# Patient Record
Sex: Male | Born: 1970 | Race: Black or African American | Hispanic: No | Marital: Single | Smoking: Never smoker
Health system: Southern US, Community
[De-identification: ages and names within clinical notes are randomized; demographics above are authoritative.]

## PROBLEM LIST (undated history)

## (undated) DIAGNOSIS — E119 Type 2 diabetes mellitus without complications: Secondary | ICD-10-CM

## (undated) DIAGNOSIS — E785 Hyperlipidemia, unspecified: Secondary | ICD-10-CM

## (undated) DIAGNOSIS — R319 Hematuria, unspecified: Secondary | ICD-10-CM

## (undated) DIAGNOSIS — J209 Acute bronchitis, unspecified: Secondary | ICD-10-CM

## (undated) DIAGNOSIS — S83281A Other tear of lateral meniscus, current injury, right knee, initial encounter: Secondary | ICD-10-CM

## (undated) DIAGNOSIS — S83241A Other tear of medial meniscus, current injury, right knee, initial encounter: Secondary | ICD-10-CM

## (undated) DIAGNOSIS — J301 Allergic rhinitis due to pollen: Secondary | ICD-10-CM

## (undated) DIAGNOSIS — I10 Essential (primary) hypertension: Secondary | ICD-10-CM

## (undated) DIAGNOSIS — S82131A Displaced fracture of medial condyle of right tibia, initial encounter for closed fracture: Secondary | ICD-10-CM

## (undated) HISTORY — DX: Hematuria, unspecified: R31.9

## (undated) HISTORY — DX: Allergic rhinitis due to pollen: J30.1

## (undated) HISTORY — DX: Hyperlipidemia, unspecified: E78.5

## (undated) HISTORY — DX: Acute bronchitis, unspecified: J20.9

## (undated) HISTORY — DX: Type 2 diabetes mellitus without complications: E11.9

## (undated) HISTORY — DX: Hypercalcemia: E83.52

## (undated) HISTORY — DX: Essential (primary) hypertension: I10

---

## 2004-10-03 ENCOUNTER — Ambulatory Visit: Payer: Self-pay | Admitting: Family Medicine

## 2006-08-05 ENCOUNTER — Emergency Department: Payer: Self-pay | Admitting: Unknown Physician Specialty

## 2006-10-02 ENCOUNTER — Emergency Department: Payer: Self-pay | Admitting: General Practice

## 2006-10-09 ENCOUNTER — Emergency Department: Payer: Self-pay | Admitting: Emergency Medicine

## 2010-12-08 ENCOUNTER — Emergency Department: Payer: Self-pay | Admitting: Emergency Medicine

## 2015-03-02 DIAGNOSIS — E1165 Type 2 diabetes mellitus with hyperglycemia: Secondary | ICD-10-CM | POA: Insufficient documentation

## 2015-03-02 DIAGNOSIS — I129 Hypertensive chronic kidney disease with stage 1 through stage 4 chronic kidney disease, or unspecified chronic kidney disease: Secondary | ICD-10-CM | POA: Insufficient documentation

## 2015-03-02 DIAGNOSIS — R319 Hematuria, unspecified: Secondary | ICD-10-CM | POA: Insufficient documentation

## 2015-03-02 DIAGNOSIS — J301 Allergic rhinitis due to pollen: Secondary | ICD-10-CM | POA: Insufficient documentation

## 2015-03-02 DIAGNOSIS — E785 Hyperlipidemia, unspecified: Secondary | ICD-10-CM | POA: Insufficient documentation

## 2015-05-04 ENCOUNTER — Encounter: Payer: Self-pay | Admitting: Family Medicine

## 2015-05-04 ENCOUNTER — Ambulatory Visit (INDEPENDENT_AMBULATORY_CARE_PROVIDER_SITE_OTHER): Payer: No Typology Code available for payment source | Admitting: Family Medicine

## 2015-05-04 VITALS — BP 156/94 | HR 89 | Temp 98.1°F | Ht 72.1 in | Wt 231.8 lb

## 2015-05-04 DIAGNOSIS — I10 Essential (primary) hypertension: Secondary | ICD-10-CM | POA: Diagnosis not present

## 2015-05-04 DIAGNOSIS — R059 Cough, unspecified: Secondary | ICD-10-CM

## 2015-05-04 DIAGNOSIS — E785 Hyperlipidemia, unspecified: Secondary | ICD-10-CM | POA: Diagnosis not present

## 2015-05-04 DIAGNOSIS — E119 Type 2 diabetes mellitus without complications: Secondary | ICD-10-CM

## 2015-05-04 DIAGNOSIS — R05 Cough: Secondary | ICD-10-CM | POA: Diagnosis not present

## 2015-05-04 LAB — LIPID PANEL PICCOLO, WAIVED
CHOL/HDL RATIO PICCOLO,WAIVE: 2.8 mg/dL
Cholesterol Piccolo, Waived: 125 mg/dL (ref <200)
HDL CHOL PICCOLO, WAIVED: 44 mg/dL — AB (ref >59)
LDL Chol Calc Piccolo Waived: 67 mg/dL (ref <100)
TRIGLYCERIDES PICCOLO,WAIVED: 68 mg/dL (ref <150)
VLDL CHOL CALC PICCOLO,WAIVE: 14 mg/dL (ref <30)

## 2015-05-04 LAB — BAYER DCA HB A1C WAIVED: HB A1C: 8.6 % — AB (ref <7.0)

## 2015-05-04 LAB — MICROALBUMIN, URINE WAIVED
CREATININE, URINE WAIVED: 100 mg/dL (ref 10–300)
MICROALB, UR WAIVED: 80 mg/L — AB (ref 0–19)

## 2015-05-04 MED ORDER — LISINOPRIL 5 MG PO TABS: 5.0000 mg | ORAL_TABLET | Freq: Every day | ORAL | Status: DC

## 2015-05-04 MED ORDER — LISINOPRIL-HYDROCHLOROTHIAZIDE 20-25 MG PO TABS: 1.0000 | ORAL_TABLET | Freq: Every day | ORAL | Status: DC

## 2015-05-04 MED ORDER — METFORMIN HCL ER (MOD) 1000 MG PO TB24: 1000.0000 mg | ORAL_TABLET | Freq: Two times a day (BID) | ORAL | Status: DC

## 2015-05-04 MED ORDER — ROSUVASTATIN CALCIUM 40 MG PO TABS: 40.0000 mg | ORAL_TABLET | Freq: Every day | ORAL | Status: DC

## 2015-05-04 NOTE — Assessment & Plan Note (Addendum)
Not under great control today, but better than expected with an A1c of 8.6. Has been out of his medicine for a month. Will check A1c and CMP today and will restart his medicine. Will recheck in 3 months at follow up DM visit. Encouraged diet and exercise and more frequent checking of his sugars as he is not checking at all.

## 2015-05-04 NOTE — Progress Notes (Signed)
BP 156/94 mmHg  Pulse 89  Temp(Src) 98.1 F (36.7 C)  Ht 6' 0.1" (1.831 m)  Wt 231 lb 12.8 oz (105.144 kg)  BMI 31.36 kg/m2  SpO2 99%   Subjective:    Patient ID: Greg Wood, male    DOB: 10-Mar-1971, 44 y.o.   MRN: [id number]  HPI: Greg Wood is a 44 y.o. male presenting on 05/04/2015 for Hypertension; Diabetes; and Hyperlipidemia Has been out of all his medicine for about a month. Apparently his medicine went to the wrong pharmacy. He states that he has been doing well. He does note that he has had a bit of a cough since he had a sinus infection in March, has had trouble getting rid of it. He is otherwise feeling well with no other concerns or complaints at this time.   HYPERTENSION / HYPERLIPIDEMIA HTN Status: Uncontrolled Satisfied with current treatment? yes Duration of hypertension: chronic BP monitoring frequency: not checking BP medication side effects: no Duration of hyperlipidemia: chronic Cholesterol medication side effects: no Cholesterol supplements: none Past cholesterol medications: none Medication compliance: fair compliance Aspirin: no Recent stressors: no Recurrent headaches: no Visual changes: no Palpitations: no Dyspnea: no Chest pain: no Lower extremity edema: no Dizzy/lightheaded: no  DIABETES Hypoglycemic episodes:no Polydipsia/polyuria: no Visual disturbance: no Chest pain: no Paresthesias: no Glucose Monitoring: no Blood Pressure Monitoring: not checking Retinal Examination: Up to Date Foot Exam: Up to Date Diabetic Education: Completed Pneumovax: Up to Date Influenza: Up to Date Aspirin: no  Relevant past medical, surgical, family and social history reviewed and updated as indicated. Interim medical history since our last visit reviewed. Allergies and medications reviewed and updated.  Review of Systems  Constitutional: Negative.   Respiratory: Negative.   Cardiovascular: Negative.   Gastrointestinal: Negative.   Endocrine:  Negative.   Genitourinary: Negative.   Psychiatric/Behavioral: Negative.     Per HPI unless specifically indicated above     Objective:    BP 156/94 mmHg  Pulse 89  Temp(Src) 98.1 F (36.7 C)  Ht 6' 0.1" (1.831 m)  Wt 231 lb 12.8 oz (105.144 kg)  BMI 31.36 kg/m2  SpO2 99%  Wt Readings from Last 3 Encounters:  05/04/15 231 lb 12.8 oz (105.144 kg)  02/09/15 230 lb (104.327 kg)    Physical Exam  Constitutional: He appears well-developed and well-nourished.  HENT:  Head: Normocephalic and atraumatic.  Eyes: Conjunctivae and EOM are normal. Pupils are equal, round, and reactive to light.  Neck: Normal range of motion. Neck supple.  Cardiovascular: Normal rate, regular rhythm and normal heart sounds.  Exam reveals no gallop and no friction rub.   No murmur heard. Pulmonary/Chest: Effort normal and breath sounds normal. No respiratory distress. He has no wheezes. He has no rales. He exhibits no tenderness.  Abdominal: Soft. Bowel sounds are normal.  Skin: Skin is warm and dry.  Psychiatric: He has a normal mood and affect. His behavior is normal. Thought content normal.  Nursing note and vitals reviewed.  DM Foot Exam Color: normal Sensation Monofilament:normal  Circulation: Pulses normal  Lesions: none      Assessment & Plan:   Problem List Items Addressed This Visit      Cardiovascular and Mediastinum   Hypertension    Not under good control. Has not been on his medicine in over a month. Will check CMP and microalbumin today. Will restart his medication and will follow up in 2-3 weeks for BP check.       Relevant  Medications   rosuvastatin (CRESTOR) 40 MG tablet   lisinopril-hydrochlorothiazide (PRINZIDE,ZESTORETIC) 20-25 MG per tablet   lisinopril (PRINIVIL,ZESTRIL) 5 MG tablet   Other Relevant Orders   Lipid Panel Piccolo, Waived   Bayer DCA Hb A1c Waived   Microalbumin, Urine Waived   Comp Met (CMET)     Endocrine   Diabetes mellitus without complication     Not under great control today, but better than expected with an A1c of 8.6. Has been out of his medicine for a month. Will check A1c and CMP today and will restart his medicine. Will recheck in 3 months at follow up DM visit. Encouraged diet and exercise and more frequent checking of his sugars as he is not checking at all.       Relevant Medications   rosuvastatin (CRESTOR) 40 MG tablet   metFORMIN (GLUMETZA) 1000 MG (MOD) 24 hr tablet   lisinopril-hydrochlorothiazide (PRINZIDE,ZESTORETIC) 20-25 MG per tablet   lisinopril (PRINIVIL,ZESTRIL) 5 MG tablet     Other   Hyperlipidemia    Under good control at this time despite the fact that he has not taken his medication in over a month. Checking lipids and CMP today. Will restart his medication and check levels in 3 months at follow up appointment. Encouraged diet and exercise. Continue to monitor.       Relevant Medications   rosuvastatin (CRESTOR) 40 MG tablet   lisinopril-hydrochlorothiazide (PRINZIDE,ZESTORETIC) 20-25 MG per tablet   lisinopril (PRINIVIL,ZESTRIL) 5 MG tablet    Other Visit Diagnoses    Type 2 diabetes mellitus without complication    -  Primary    Relevant Medications    rosuvastatin (CRESTOR) 40 MG tablet    metFORMIN (GLUMETZA) 1000 MG (MOD) 24 hr tablet    lisinopril-hydrochlorothiazide (PRINZIDE,ZESTORETIC) 20-25 MG per tablet    lisinopril (PRINIVIL,ZESTRIL) 5 MG tablet    Other Relevant Orders    Lipid Panel Piccolo, Waived    Bayer DCA Hb A1c Waived    Microalbumin, Urine Waived    Comp Met (CMET)    Hyperlipemia        Relevant Medications    rosuvastatin (CRESTOR) 40 MG tablet    lisinopril-hydrochlorothiazide (PRINZIDE,ZESTORETIC) 20-25 MG per tablet    lisinopril (PRINIVIL,ZESTRIL) 5 MG tablet    Other Relevant Orders    Lipid Panel Piccolo, Waived    Bayer DCA Hb A1c Waived    Microalbumin, Urine Waived    Comp Met (CMET)    Cough        Lungs clear today. Will check spirometry next visit.  Continue to monitor. Unlikely lisinopril as he has not been on it.         Follow up plan: Return 2-3 weeks, for BP check and Spirometry.

## 2015-05-04 NOTE — Patient Instructions (Signed)
DASH Eating Plan DASH stands for "Dietary Approaches to Stop Hypertension." The DASH eating plan is a healthy eating plan that has been shown to reduce high blood pressure (hypertension). Additional health benefits may include reducing the risk of type 2 diabetes mellitus, heart disease, and stroke. The DASH eating plan may also help with weight loss. WHAT DO I NEED TO KNOW ABOUT THE DASH EATING PLAN? For the DASH eating plan, you will follow these general guidelines:  Choose foods with a percent daily value for sodium of less than 5% (as listed on the food label).  Use salt-free seasonings or herbs instead of table salt or sea salt.  Check with your health care provider or pharmacist before using salt substitutes.  Eat lower-sodium products, often labeled as "lower sodium" or "no salt added."  Eat fresh foods.  Eat more vegetables, fruits, and low-fat dairy products.  Choose whole grains. Look for the word "whole" as the first word in the ingredient list.  Choose fish and skinless chicken or turkey more often than red meat. Limit fish, poultry, and meat to 6 oz (170 g) each day.  Limit sweets, desserts, sugars, and sugary drinks.  Choose heart-healthy fats.  Limit cheese to 1 oz (28 g) per day.  Eat more home-cooked food and less restaurant, buffet, and fast food.  Limit fried foods.  Cook foods using methods other than frying.  Limit canned vegetables. If you do use them, rinse them well to decrease the sodium.  When eating at a restaurant, ask that your food be prepared with less salt, or no salt if possible. WHAT FOODS CAN I EAT? Seek help from a dietitian for individual calorie needs. Grains Whole grain or whole wheat bread. [place]fitt rice. Whole grain or whole wheat pasta. Quinoa, bulgur, and whole grain cereals. Low-sodium cereals. Corn or whole wheat flour tortillas. Whole grain cornbread. Whole grain crackers. Low-sodium crackers. Vegetables Fresh or frozen vegetables  (raw, steamed, roasted, or grilled). Low-sodium or reduced-sodium tomato and vegetable juices. Low-sodium or reduced-sodium tomato sauce and paste. Low-sodium or reduced-sodium canned vegetables.  Fruits All fresh, canned (in natural juice), or frozen fruits. Meat and Other Protein Products Ground beef (85% or leaner), grass-fed beef, or beef trimmed of fat. Skinless chicken or turkey. Ground chicken or turkey. Pork trimmed of fat. All fish and seafood. Eggs. Dried beans, peas, or lentils. Unsalted nuts and seeds. Unsalted canned beans. Dairy Low-fat dairy products, such as skim or 1% milk, 2% or reduced-fat cheeses, low-fat ricotta or cottage cheese, or plain low-fat yogurt. Low-sodium or reduced-sodium cheeses. Fats and Oils Tub margarines without trans fats. Light or reduced-fat mayonnaise and salad dressings (reduced sodium). Avocado. Safflower, olive, or canola oils. Natural peanut or almond butter. Other Unsalted popcorn and pretzels. The items listed above may not be a complete list of recommended foods or beverages. Contact your dietitian for more options. WHAT FOODS ARE NOT RECOMMENDED? Grains White bread. White pasta. White rice. Refined cornbread. Bagels and croissants. Crackers that contain trans fat. Vegetables Creamed or fried vegetables. Vegetables in a cheese sauce. Regular canned vegetables. Regular canned tomato sauce and paste. Regular tomato and vegetable juices. Fruits Dried fruits. Canned fruit in light or heavy syrup. Fruit juice. Meat and Other Protein Products Fatty cuts of meat. Ribs, chicken wings, bacon, sausage, bologna, salami, chitterlings, fatback, hot dogs, bratwurst, and packaged luncheon meats. Salted nuts and seeds. Canned beans with salt. Dairy Whole or 2% milk, cream, half-and-half, and cream cheese. Whole-fat or sweetened yogurt. Full-fat   cheeses or blue cheese. Nondairy creamers and whipped toppings. Processed cheese, cheese spreads, or cheese  curds. Condiments Onion and garlic salt, seasoned salt, table salt, and sea salt. Canned and packaged gravies. Worcestershire sauce. Tartar sauce. Barbecue sauce. Teriyaki sauce. Soy sauce, including reduced sodium. Steak sauce. Fish sauce. Oyster sauce. Cocktail sauce. Horseradish. Ketchup and mustard. Meat flavorings and tenderizers. Bouillon cubes. Hot sauce. Tabasco sauce. Marinades. Taco seasonings. Relishes. Fats and Oils Butter, stick margarine, lard, shortening, ghee, and bacon fat. Coconut, palm kernel, or palm oils. Regular salad dressings. Other Pickles and olives. Salted popcorn and pretzels. The items listed above may not be a complete list of foods and beverages to avoid. Contact your dietitian for more information. WHERE CAN I FIND MORE INFORMATION? National Heart, Lung, and Blood Institute: www.nhlbi.nih.gov/health/health-topics/topics/dash/ Document Released: 10/30/2011 Document Revised: 03/27/2014 Document Reviewed: 09/14/2013 ExitCare Patient Information 2015 [employer]. This information is not intended to replace advice given to you by your health care provider. Make sure you discuss any questions you have with your health care provider. Diabetes and Exercise Exercising regularly is important. It is not just about losing weight. It has many health benefits, such as:  Improving your overall fitness, flexibility, and endurance.  Increasing your bone density.  Helping with weight control.  Decreasing your body fat.  Increasing your muscle strength.  Reducing stress and tension.  Improving your overall health. People with diabetes who exercise gain additional benefits because exercise:  Reduces appetite.  Improves the body's use of blood sugar (glucose).  Helps lower or control blood glucose.  Decreases blood pressure.  Helps control blood lipids (such as cholesterol and triglycerides).  Improves the body's use of the hormone insulin by:  Increasing the  body's insulin sensitivity.  Reducing the body's insulin needs.  Decreases the risk for heart disease because exercising:  Lowers cholesterol and triglycerides levels.  Increases the levels of good cholesterol (such as high-density lipoproteins [HDL]) in the body.  Lowers blood glucose levels. YOUR ACTIVITY PLAN  Choose an activity that you enjoy and set realistic goals. Your health care provider or diabetes educator can help you make an activity plan that works for you. Exercise regularly as directed by your health care provider. This includes:  Performing resistance training twice a week such as push-ups, sit-ups, lifting weights, or using resistance bands.  Performing 150 minutes of cardio exercises each week such as walking, running, or playing sports.  Staying active and spending no more than 90 minutes at one time being inactive. Even short bursts of exercise are good for you. Three 10-minute sessions spread throughout the day are just as beneficial as a single 30-minute session. Some exercise ideas include:  Taking the dog for a walk.  Taking the stairs instead of the elevator.  Dancing to your favorite song.  Doing an exercise video.  Doing your favorite exercise with a friend. RECOMMENDATIONS FOR EXERCISING WITH TYPE 1 OR TYPE 2 DIABETES   Check your blood glucose before exercising. If blood glucose levels are greater than 240 mg/dL, check for urine ketones. Do not exercise if ketones are present.  Avoid injecting insulin into areas of the body that are going to be exercised. For example, avoid injecting insulin into:  The arms when playing tennis.  The legs when jogging.  Keep a record of:  Food intake before and after you exercise.  Expected peak times of insulin action.  Blood glucose levels before and after you exercise.  The type and amount of exercise   you have done.  Review your records with your health care provider. Your health care provider will  help you to develop guidelines for adjusting food intake and insulin amounts before and after exercising.  If you take insulin or oral hypoglycemic agents, watch for signs and symptoms of hypoglycemia. They include:  Dizziness.  Shaking.  Sweating.  Chills.  Confusion.  Drink plenty of water while you exercise to prevent dehydration or heat stroke. Body water is lost during exercise and must be replaced.  Talk to your health care provider before starting an exercise program to make sure it is safe for you. Remember, almost any type of activity is better than none. Document Released: 01/31/2004 Document Revised: 03/27/2014 Document Reviewed: 04/19/2013 ExitCare Patient Information 2015 [employer]. This information is not intended to replace advice given to you by your health care provider. Make sure you discuss any questions you have with your health care provider.  

## 2015-05-04 NOTE — Assessment & Plan Note (Addendum)
Under good control at this time despite the fact that he has not taken his medication in over a month. Checking lipids and CMP today. Will restart his medication and check levels in 3 months at follow up appointment. Encouraged diet and exercise. Continue to monitor.

## 2015-05-04 NOTE — Assessment & Plan Note (Addendum)
Not under good control. Has not been on his medicine in over a month. Will check CMP and microalbumin today. Will restart his medication and will follow up in 2-3 weeks for BP check.

## 2015-05-05 LAB — COMPREHENSIVE METABOLIC PANEL
A/G RATIO: 1.8 (ref 1.1–2.5)
ALBUMIN: 4.5 g/dL (ref 3.5–5.5)
ALK PHOS: 54 IU/L (ref 39–117)
ALT: 18 IU/L (ref 0–44)
AST: 15 IU/L (ref 0–40)
BILIRUBIN TOTAL: 0.3 mg/dL (ref 0.0–1.2)
BUN/Creatinine Ratio: 12 (ref 9–20)
BUN: 10 mg/dL (ref 6–24)
CO2: 24 mmol/L (ref 18–29)
Calcium: 9.6 mg/dL (ref 8.7–10.2)
Chloride: 103 mmol/L (ref 97–108)
Creatinine, Ser: 0.83 mg/dL (ref 0.76–1.27)
GFR calc Af Amer: 122 mL/min/{1.73_m2} (ref >59)
GFR calc non Af Amer: 106 mL/min/{1.73_m2} (ref >59)
Globulin, Total: 2.5 g/dL (ref 1.5–4.5)
Glucose: 182 mg/dL — ABNORMAL HIGH (ref 65–99)
Potassium: 4.5 mmol/L (ref 3.5–5.2)
SODIUM: 142 mmol/L (ref 134–144)
Total Protein: 7 g/dL (ref 6.0–8.5)

## 2015-05-18 ENCOUNTER — Ambulatory Visit: Payer: No Typology Code available for payment source | Admitting: Family Medicine

## 2015-05-31 ENCOUNTER — Other Ambulatory Visit: Payer: Self-pay | Admitting: Family Medicine

## 2015-05-31 NOTE — Telephone Encounter (Signed)
Called and spoke with the pharmacy, even though the prescription said that it was received from us they did not. I called in the one written on 05/04/15.

## 2015-05-31 NOTE — Telephone Encounter (Signed)
Pt's wife called stated pharmacy did not receive refill on Metformin. Pt is almost out has a 2 day supply. Please resend refill for Metformin. [facility] [place]

## 2015-06-02 ENCOUNTER — Other Ambulatory Visit: Payer: Self-pay | Admitting: Family Medicine

## 2015-06-13 ENCOUNTER — Other Ambulatory Visit: Payer: Self-pay | Admitting: Family Medicine

## 2015-07-01 ENCOUNTER — Other Ambulatory Visit: Payer: Self-pay | Admitting: Family Medicine

## 2016-01-23 ENCOUNTER — Other Ambulatory Visit: Payer: Self-pay | Admitting: Family Medicine

## 2016-01-24 NOTE — Telephone Encounter (Signed)
Needs an appointment. Will get him enough medicine to make it to appointment when it's booked.   

## 2016-01-24 NOTE — Telephone Encounter (Signed)
Patient called and schedule f/u appt 01/28/16 in a.m.

## 2016-01-24 NOTE — Telephone Encounter (Signed)
Left message for patient to call and schedule an appointment, then enough medication will be sent to the pharmacy.

## 2016-01-28 ENCOUNTER — Ambulatory Visit (INDEPENDENT_AMBULATORY_CARE_PROVIDER_SITE_OTHER): Payer: No Typology Code available for payment source | Admitting: Family Medicine

## 2016-01-28 ENCOUNTER — Encounter: Payer: Self-pay | Admitting: Family Medicine

## 2016-01-28 ENCOUNTER — Other Ambulatory Visit: Payer: Self-pay | Admitting: Family Medicine

## 2016-01-28 DIAGNOSIS — Z9119 Patient's noncompliance with other medical treatment and regimen: Secondary | ICD-10-CM

## 2016-01-28 DIAGNOSIS — I129 Hypertensive chronic kidney disease with stage 1 through stage 4 chronic kidney disease, or unspecified chronic kidney disease: Secondary | ICD-10-CM | POA: Diagnosis not present

## 2016-01-28 DIAGNOSIS — Z91199 Patient's noncompliance with other medical treatment and regimen due to unspecified reason: Secondary | ICD-10-CM | POA: Insufficient documentation

## 2016-01-28 DIAGNOSIS — Z114 Encounter for screening for human immunodeficiency virus [HIV]: Secondary | ICD-10-CM

## 2016-01-28 DIAGNOSIS — R319 Hematuria, unspecified: Secondary | ICD-10-CM

## 2016-01-28 DIAGNOSIS — E1165 Type 2 diabetes mellitus with hyperglycemia: Secondary | ICD-10-CM

## 2016-01-28 DIAGNOSIS — E785 Hyperlipidemia, unspecified: Secondary | ICD-10-CM

## 2016-01-28 LAB — UA/M W/RFLX CULTURE, ROUTINE
Bilirubin, UA: NEGATIVE
KETONES UA: NEGATIVE
LEUKOCYTES UA: NEGATIVE
Nitrite, UA: NEGATIVE
PROTEIN UA: NEGATIVE
RBC, UA: NEGATIVE
Specific Gravity, UA: 1.02 (ref 1.005–1.030)
Urobilinogen, Ur: 0.2 mg/dL (ref 0.2–1.0)
pH, UA: 5 (ref 5.0–7.5)

## 2016-01-28 LAB — LIPID PANEL PICCOLO, WAIVED
CHOL/HDL RATIO PICCOLO,WAIVE: 4.5 mg/dL
Cholesterol Piccolo, Waived: 227 mg/dL — ABNORMAL HIGH (ref <200)
HDL Chol Piccolo, Waived: 50 mg/dL — ABNORMAL LOW (ref >59)
LDL CHOL CALC PICCOLO WAIVED: 109 mg/dL — AB (ref <100)
Triglycerides Piccolo,Waived: 340 mg/dL — ABNORMAL HIGH (ref <150)
VLDL Chol Calc Piccolo,Waive: 68 mg/dL — ABNORMAL HIGH (ref <30)

## 2016-01-28 LAB — MICROALBUMIN, URINE WAIVED
Creatinine, Urine Waived: 200 mg/dL (ref 10–300)
MICROALB, UR WAIVED: 30 mg/L — AB (ref 0–19)

## 2016-01-28 LAB — BAYER DCA HB A1C WAIVED: HB A1C: 8.6 % — AB (ref <7.0)

## 2016-01-28 MED ORDER — ROSUVASTATIN CALCIUM 40 MG PO TABS: 40.0000 mg | ORAL_TABLET | Freq: Every day | ORAL | Status: DC

## 2016-01-28 MED ORDER — METFORMIN HCL ER (OSM) 1000 MG PO TB24: 1000.0000 mg | ORAL_TABLET | Freq: Two times a day (BID) | ORAL | Status: DC

## 2016-01-28 MED ORDER — LISINOPRIL-HYDROCHLOROTHIAZIDE 20-25 MG PO TABS: 1.0000 | ORAL_TABLET | Freq: Every day | ORAL | Status: DC

## 2016-01-28 MED ORDER — LISINOPRIL 5 MG PO TABS: 5.0000 mg | ORAL_TABLET | Freq: Every day | ORAL | Status: DC

## 2016-01-28 NOTE — Assessment & Plan Note (Signed)
Restart crestor. Take it before he goes to bed. Continue to monitor. Recheck 3 months.

## 2016-01-28 NOTE — Assessment & Plan Note (Signed)
Resolved on urine today.

## 2016-01-28 NOTE — Assessment & Plan Note (Signed)
Strongly encouraged patient to take his medicine every day and to keep his appointments.

## 2016-01-28 NOTE — Progress Notes (Signed)
BP 128/90 mmHg  Pulse 82  Temp(Src) 97.6 F (36.4 C)  Ht 5' 11.1" (1.806 m)  Wt 233 lb (105.688 kg)  BMI 32.40 kg/m2  SpO2 99%   Subjective:    Patient ID: Greg Wood, male    DOB: [birth year], 45 y.o.   MRN: [id number]  HPI: Greg Wood is a 45 y.o. male who returns to the office today after being lost to follow up for 9 months. He has not had any of his medicine in >3 months.   Chief Complaint  Patient presents with  . Hypertension  . Hyperlipidemia  . Diabetes   HYPERTENSION / HYPERLIPIDEMIA Satisfied with current treatment? no Duration of hypertension: chronic BP monitoring frequency: monthly BP medication side effects: no Duration of hyperlipidemia: chronic Cholesterol medication side effects: yes, was making him feel sick Cholesterol supplements: none Medication compliance: poor compliance Aspirin: no Recent stressors: no Recurrent headaches: no Visual changes: no Palpitations: no Dyspnea: no Chest pain: no Lower extremity edema: no Dizzy/lightheaded: no  DIABETES Hypoglycemic episodes:no Polydipsia/polyuria: yes Visual disturbance: no Chest pain: no Paresthesias: no Glucose Monitoring: no  Accucheck frequency: Not Checking Taking Insulin?: no Blood Pressure Monitoring: not checking Retinal Examination: Up to Date Foot Exam: Up to Date Diabetic Education: Completed Pneumovax: Up to Date Influenza: Up to Date Aspirin: yes  Relevant past medical, surgical, family and social history reviewed and updated as indicated. Interim medical history since our last visit reviewed. Allergies and medications reviewed and updated.  Review of Systems  Constitutional: Negative.   Respiratory: Negative.   Cardiovascular: Negative.   Psychiatric/Behavioral: Negative.     Per HPI unless specifically indicated above     Objective:    BP 128/90 mmHg  Pulse 82  Temp(Src) 97.6 F (36.4 C)  Ht 5' 11.1" (1.806 m)  Wt 233 lb (105.688 kg)  BMI 32.40 kg/m2   SpO2 99%  Wt Readings from Last 3 Encounters:  01/28/16 233 lb (105.688 kg)  05/04/15 231 lb 12.8 oz (105.144 kg)  02/09/15 230 lb (104.327 kg)    Physical Exam  Constitutional: He is oriented to person, place, and time. He appears well-developed and well-nourished. No distress.  HENT:  Head: Normocephalic and atraumatic.  Right Ear: Hearing normal.  Left Ear: Hearing normal.  Nose: Nose normal.  Eyes: Conjunctivae and lids are normal. Right eye exhibits no discharge. Left eye exhibits no discharge. No scleral icterus.  Cardiovascular: Normal rate, regular rhythm, normal heart sounds and intact distal pulses.  Exam reveals no gallop and no friction rub.   No murmur heard. Pulmonary/Chest: Effort normal and breath sounds normal. No respiratory distress. He has no wheezes. He has no rales. He exhibits no tenderness.  Musculoskeletal: Normal range of motion.  Neurological: He is alert and oriented to person, place, and time.  Skin: Skin is warm, dry and intact. No rash noted. No erythema. No pallor.  Psychiatric: He has a normal mood and affect. His speech is normal and behavior is normal. Judgment and thought content normal. Cognition and memory are normal.  Nursing note and vitals reviewed.   Results for orders placed or performed in visit on 05/04/15  Lipid Panel Piccolo, [employer]  Result Value Ref Range   Cholesterol Piccolo, Waived 125 <200 mg/dL   HDL Chol Piccolo, Waived 44 (L) >59 mg/dL   Triglycerides Piccolo,Waived 68 <150 mg/dL   Chol/HDL Ratio Piccolo,Waive 2.8 mg/dL   LDL Chol Calc Piccolo Waived 67 <100 mg/dL   VLDL Chol Calc Piccolo,Waive  14 <30 mg/dL  Bayer DCA Hb A1c Waived  Result Value Ref Range   Bayer DCA Hb A1c Waived 8.6 (H) <7.0 %  Microalbumin, Urine Waived  Result Value Ref Range   Microalb, Ur Waived 80 (H) 0 - 19 mg/L   Creatinine, Urine Waived 100 10 - 300 mg/dL   Microalb/Creat Ratio 30-300 (H) <30 mg/g  Comp Met (CMET)  Result Value Ref Range    Glucose 182 (H) 65 - 99 mg/dL   BUN 10 6 - 24 mg/dL   Creatinine, Ser 0.83 0.76 - 1.27 mg/dL   GFR calc non Af Amer 106 >59 mL/min/1.73   GFR calc Af Amer 122 >59 mL/min/1.73   BUN/Creatinine Ratio 12 9 - 20   Sodium 142 134 - 144 mmol/L   Potassium 4.5 3.5 - 5.2 mmol/L   Chloride 103 97 - 108 mmol/L   CO2 24 18 - 29 mmol/L   Calcium 9.6 8.7 - 10.2 mg/dL   Total Protein 7.0 6.0 - 8.5 g/dL   Albumin 4.5 3.5 - 5.5 g/dL   Globulin, Total 2.5 1.5 - 4.5 g/dL   Albumin/Globulin Ratio 1.8 1.1 - 2.5   Bilirubin Total 0.3 0.0 - 1.2 mg/dL   Alkaline Phosphatase 54 39 - 117 IU/L   AST 15 0 - 40 IU/L   ALT 18 0 - 44 IU/L      Assessment & Plan:   Problem List Items Addressed This Visit      Endocrine   Diabetes mellitus with hyperglycemia ([place])    Not under good control. A1c still 8.6, but hasn't been on his medicine in 3 months. Stressed the importance of taking his medication every day. Refills given today. Recheck 3 months.       Relevant Medications   rosuvastatin (CRESTOR) 40 MG tablet   metformin (FORTAMET) 1000 MG (OSM) 24 hr tablet   lisinopril-hydrochlorothiazide (PRINZIDE,ZESTORETIC) 20-25 MG tablet   lisinopril (PRINIVIL,ZESTRIL) 5 MG tablet   Other Relevant Orders   Bayer DCA Hb A1c Waived     Genitourinary   Benign hypertensive renal disease    Not under good control. Stressed the importance of taking his medication every day. Refills given today. Recheck 3 months.         Other   Blood in the urine    Resolved on urine today.      Hyperlipidemia    Restart crestor. Take it before he goes to bed. Continue to monitor. Recheck 3 months.       Relevant Medications   rosuvastatin (CRESTOR) 40 MG tablet   lisinopril-hydrochlorothiazide (PRINZIDE,ZESTORETIC) 20-25 MG tablet   lisinopril (PRINIVIL,ZESTRIL) 5 MG tablet   Other Relevant Orders   LP+ALT+AST Piccolo, Waived   Noncompliance    Strongly encouraged patient to take his medicine every day and to keep his  appointments.        Other Visit Diagnoses    Screening for HIV without presence of risk factors        Labs checked today.        Follow up plan: Return in about 3 months (around 04/29/2016) for DM visit.

## 2016-01-28 NOTE — Assessment & Plan Note (Signed)
Not under good control. Stressed the importance of taking his medication every day. Refills given today. Recheck 3 months.

## 2016-01-28 NOTE — Assessment & Plan Note (Signed)
Not under good control. A1c still 8.6, but hasn't been on his medicine in 3 months. Stressed the importance of taking his medication every day. Refills given today. Recheck 3 months.

## 2016-01-29 ENCOUNTER — Encounter: Payer: Self-pay | Admitting: Family Medicine

## 2016-01-29 LAB — TSH: TSH: 1.06 u[IU]/mL (ref 0.450–4.500)

## 2016-01-29 LAB — COMPREHENSIVE METABOLIC PANEL
ALBUMIN: 4.4 g/dL (ref 3.5–5.5)
ALK PHOS: 49 IU/L (ref 39–117)
ALT: 22 IU/L (ref 0–44)
AST: 17 IU/L (ref 0–40)
Albumin/Globulin Ratio: 1.6 (ref 1.1–2.5)
BUN/Creatinine Ratio: 12 (ref 9–20)
BUN: 11 mg/dL (ref 6–24)
Bilirubin Total: 0.3 mg/dL (ref 0.0–1.2)
CALCIUM: 9.2 mg/dL (ref 8.7–10.2)
CHLORIDE: 98 mmol/L (ref 96–106)
CO2: 23 mmol/L (ref 18–29)
CREATININE: 0.9 mg/dL (ref 0.76–1.27)
GFR calc Af Amer: 117 mL/min/{1.73_m2} (ref >59)
GFR, EST NON AFRICAN AMERICAN: 101 mL/min/{1.73_m2} (ref >59)
GLOBULIN, TOTAL: 2.7 g/dL (ref 1.5–4.5)
GLUCOSE: 175 mg/dL — AB (ref 65–99)
POTASSIUM: 4.5 mmol/L (ref 3.5–5.2)
Sodium: 136 mmol/L (ref 134–144)
Total Protein: 7.1 g/dL (ref 6.0–8.5)

## 2016-01-29 LAB — CBC WITH DIFFERENTIAL/PLATELET
BASOS ABS: 0 10*3/uL (ref 0.0–0.2)
Basos: 1 %
EOS (ABSOLUTE): 0.1 10*3/uL (ref 0.0–0.4)
Eos: 2 %
HEMOGLOBIN: 14.4 g/dL (ref 12.6–17.7)
Hematocrit: 43.2 % (ref 37.5–51.0)
IMMATURE GRANS (ABS): 0 10*3/uL (ref 0.0–0.1)
Immature Granulocytes: 0 %
LYMPHS: 46 %
Lymphocytes Absolute: 2.2 10*3/uL (ref 0.7–3.1)
MCH: 28.3 pg (ref 26.6–33.0)
MCHC: 33.3 g/dL (ref 31.5–35.7)
MCV: 85 fL (ref 79–97)
MONOCYTES: 10 %
Monocytes Absolute: 0.5 10*3/uL (ref 0.1–0.9)
NEUTROS ABS: 1.9 10*3/uL (ref 1.4–7.0)
NEUTROS PCT: 41 %
PLATELETS: 236 10*3/uL (ref 150–379)
RBC: 5.09 x10E6/uL (ref 4.14–5.80)
RDW: 13.4 % (ref 12.3–15.4)
WBC: 4.7 10*3/uL (ref 3.4–10.8)

## 2016-01-29 LAB — HIV ANTIBODY (ROUTINE TESTING W REFLEX): HIV SCREEN 4TH GENERATION: NONREACTIVE

## 2016-02-04 ENCOUNTER — Encounter: Payer: Self-pay | Admitting: Family Medicine

## 2016-02-04 ENCOUNTER — Ambulatory Visit (INDEPENDENT_AMBULATORY_CARE_PROVIDER_SITE_OTHER): Payer: No Typology Code available for payment source | Admitting: Family Medicine

## 2016-02-04 ENCOUNTER — Telehealth: Payer: Self-pay | Admitting: Family Medicine

## 2016-02-04 ENCOUNTER — Ambulatory Visit
Admission: RE | Admit: 2016-02-04 | Discharge: 2016-02-04 | Disposition: A | Discharge status: Home / Self Care | Payer: No Typology Code available for payment source | Source: Ambulatory Visit | Attending: Family Medicine | Admitting: Family Medicine

## 2016-02-04 VITALS — BP 139/89 | HR 71 | Temp 98.6°F | Wt 235.0 lb

## 2016-02-04 DIAGNOSIS — M546 Pain in thoracic spine: Secondary | ICD-10-CM | POA: Diagnosis present

## 2016-02-04 DIAGNOSIS — Z043 Encounter for examination and observation following other accident: Secondary | ICD-10-CM | POA: Diagnosis not present

## 2016-02-04 DIAGNOSIS — M542 Cervicalgia: Secondary | ICD-10-CM | POA: Diagnosis present

## 2016-02-04 DIAGNOSIS — M50322 Other cervical disc degeneration at C5-C6 level: Secondary | ICD-10-CM | POA: Insufficient documentation

## 2016-02-04 DIAGNOSIS — M47814 Spondylosis without myelopathy or radiculopathy, thoracic region: Secondary | ICD-10-CM | POA: Diagnosis not present

## 2016-02-04 DIAGNOSIS — Z041 Encounter for examination and observation following transport accident: Secondary | ICD-10-CM

## 2016-02-04 MED ORDER — CYCLOBENZAPRINE HCL 10 MG PO TABS: 10.0000 mg | ORAL_TABLET | Freq: Every day | ORAL | Status: DC

## 2016-02-04 MED ORDER — IBUPROFEN 600 MG PO TABS: 600.0000 mg | ORAL_TABLET | Freq: Three times a day (TID) | ORAL | Status: DC | PRN
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
## 2016-02-04 NOTE — Patient Instructions (Signed)

## 2016-02-04 NOTE — Telephone Encounter (Signed)
Greg Wood with the results of his x-rays which showed some spasm but no other issues. LMOM for him to call back. OK to give him this message if he calls.

## 2016-02-06 ENCOUNTER — Encounter: Payer: Self-pay | Admitting: Family Medicine

## 2016-02-06 NOTE — Telephone Encounter (Signed)
Unable to get in contact with Debby BudAndre. Letter generated and sent through my chart today.

## 2016-02-23 ENCOUNTER — Other Ambulatory Visit: Payer: Self-pay | Admitting: Family Medicine

## 2016-02-25 NOTE — Telephone Encounter (Signed)
He should have a Rx with 6 refills sent over 3/6

## 2016-02-25 NOTE — Telephone Encounter (Signed)
Patient notified

## 2016-04-30 ENCOUNTER — Ambulatory Visit: Payer: No Typology Code available for payment source | Admitting: Family Medicine

## 2016-08-30 ENCOUNTER — Other Ambulatory Visit: Payer: Self-pay | Admitting: Family Medicine

## 2016-09-29 ENCOUNTER — Other Ambulatory Visit: Payer: Self-pay | Admitting: Family Medicine

## 2016-10-03 ENCOUNTER — Telehealth: Payer: Self-pay | Admitting: Family Medicine

## 2016-10-03 MED ORDER — LISINOPRIL-HYDROCHLOROTHIAZIDE 20-25 MG PO TABS: 1.0000 | ORAL_TABLET | Freq: Every day | ORAL | 0 refills | Status: DC

## 2016-10-03 MED ORDER — METFORMIN HCL ER (OSM) 1000 MG PO TB24: 1000.0000 mg | ORAL_TABLET | Freq: Two times a day (BID) | ORAL | 0 refills | Status: DC

## 2016-10-03 MED ORDER — LISINOPRIL 5 MG PO TABS: 5.0000 mg | ORAL_TABLET | Freq: Every day | ORAL | 0 refills | Status: DC

## 2016-10-03 MED ORDER — ROSUVASTATIN CALCIUM 40 MG PO TABS: 40.0000 mg | ORAL_TABLET | Freq: Every day | ORAL | 0 refills | Status: DC

## 2016-10-03 NOTE — Telephone Encounter (Signed)
Pt scheduled appt 10/09/16. Would like lisinopril-hydrochlorothiazide (PRINZIDE,ZESTORETIC) 20-25 MG tablet and metformin (FORTAMET) 1000 MG (OSM) 24 hr tablet [facility].

## 2016-10-03 NOTE — Telephone Encounter (Signed)
LMOM for pt to call back to schedule an appt with PCP for refills.

## 2016-10-09 ENCOUNTER — Ambulatory Visit: Payer: No Typology Code available for payment source | Admitting: Family Medicine

## 2016-11-13 ENCOUNTER — Encounter: Payer: Self-pay | Admitting: Family Medicine

## 2016-11-13 ENCOUNTER — Ambulatory Visit (INDEPENDENT_AMBULATORY_CARE_PROVIDER_SITE_OTHER): Payer: No Typology Code available for payment source | Admitting: Family Medicine

## 2016-11-13 VITALS — BP 156/100 | HR 83 | Temp 98.4°F | Ht 71.9 in | Wt 234.4 lb

## 2016-11-13 DIAGNOSIS — I129 Hypertensive chronic kidney disease with stage 1 through stage 4 chronic kidney disease, or unspecified chronic kidney disease: Secondary | ICD-10-CM | POA: Diagnosis not present

## 2016-11-13 DIAGNOSIS — E1165 Type 2 diabetes mellitus with hyperglycemia: Secondary | ICD-10-CM | POA: Diagnosis not present

## 2016-11-13 DIAGNOSIS — E782 Mixed hyperlipidemia: Secondary | ICD-10-CM

## 2016-11-13 LAB — MICROSCOPIC EXAMINATION
Bacteria, UA: NONE SEEN
Epithelial Cells (non renal): NONE SEEN /hpf (ref 0–10)
RBC MICROSCOPIC, UA: NONE SEEN /HPF (ref 0–?)
WBC UA: NONE SEEN /HPF (ref 0–?)

## 2016-11-13 LAB — UA/M W/RFLX CULTURE, ROUTINE
BILIRUBIN UA: NEGATIVE
Ketones, UA: NEGATIVE
Leukocytes, UA: NEGATIVE
NITRITE UA: NEGATIVE
PH UA: 5.5 (ref 5.0–7.5)
PROTEIN UA: NEGATIVE
RBC UA: NEGATIVE
Specific Gravity, UA: >1.03 — ABNORMAL HIGH (ref 1.005–1.030)
UUROB: 0.2 mg/dL (ref 0.2–1.0)

## 2016-11-13 MED ORDER — LISINOPRIL 5 MG PO TABS
5.0000 mg | ORAL_TABLET | Freq: Every day | ORAL | 0 refills | Status: DC
Start: 2016-11-13 — End: 2017-03-21

## 2016-11-13 MED ORDER — METFORMIN HCL ER (OSM) 1000 MG PO TB24: 1000.0000 mg | ORAL_TABLET | Freq: Two times a day (BID) | ORAL | 0 refills | Status: DC

## 2016-11-13 MED ORDER — ROSUVASTATIN CALCIUM 40 MG PO TABS
40.0000 mg | ORAL_TABLET | Freq: Every day | ORAL | 0 refills | Status: DC
Start: 2016-11-13 — End: 2017-06-03

## 2016-11-13 MED ORDER — LISINOPRIL-HYDROCHLOROTHIAZIDE 20-25 MG PO TABS: 1.0000 | ORAL_TABLET | Freq: Every day | ORAL | 0 refills | Status: DC

## 2016-11-13 NOTE — Progress Notes (Signed)
BP (!) 156/100 (BP Location: Left Arm, Cuff Size: Large)   Pulse 83   Temp 98.4 F (36.9 C)   Ht 5' 11.9" (1.826 m)   Wt 234 lb 6.4 oz (106.3 kg)   SpO2 98%   BMI 31.88 kg/m    Subjective:    Patient ID: Greg Wood, male    DOB: [birth year], 45 y.o.   MRN: [id number]  HPI: Greg Ruandre E Rossin is a 45 y.o. male  Chief Complaint  Patient presents with  . Diabetes    pt states last eye exam in chart is correct   . Hyperlipidemia  . Hypertension  . Medication Refill    pt states he needs all medications refilled, states he has been out for a while and has not been taking the crestor   Patient presents for management of chronic conditions after being lost to follow up the last 6-9 months. Has been out of all medications for about 2 weeks now. States he stopped his cholesterol medicine on his own about that long ago due to possible stomach upset with it. Has not been watching diet carefully or exercising, but states he does try to eat less sweets and does not hardly ever drink sweet tea. Also cut out most fried Taking medicines pretty regularly when he does have them. Feels very well overall, no complaints at this time. Does well with metformin and BP medications, no side effects noted with them.  Not fasting today for his labs.   Past Medical History:  Diagnosis Date  . Acute bronchitis   . Allergic rhinitis due to pollen   . Blood in the urine   . Diabetes mellitus without complication (HCC)   . Hypercalcemia    Resolved on recheck  . Hyperlipidemia   . Hypertension    Social History   Social History  . Marital status: Single    Spouse name: N/A  . Number of children: N/A  . Years of education: N/A   Occupational History  . Not on file.   Social History Main Topics  . Smoking status: Never Smoker  . Smokeless tobacco: Never Used  . Alcohol use No  . Drug use: No  . Sexual activity: Yes    Birth control/ protection: None   Other Topics Concern  . Not on file    Social History Narrative  . No narrative on file    Relevant past medical, surgical, family and social history reviewed and updated as indicated. Interim medical history since our last visit reviewed. Allergies and medications reviewed and updated.  Review of Systems  Constitutional: Negative.   HENT: Negative.   Eyes: Negative.   Respiratory: Negative.   Cardiovascular: Negative.   Gastrointestinal: Negative.   Genitourinary: Negative.   Musculoskeletal: Negative.   Skin: Negative.   Neurological: Negative.   Psychiatric/Behavioral: Negative.     Per HPI unless specifically indicated above     Objective:    BP (!) 156/100 (BP Location: Left Arm, Cuff Size: Large)   Pulse 83   Temp 98.4 F (36.9 C)   Ht 5' 11.9" (1.826 m)   Wt 234 lb 6.4 oz (106.3 kg)   SpO2 98%   BMI 31.88 kg/m   Wt Readings from Last 3 Encounters:  11/13/16 234 lb 6.4 oz (106.3 kg)  02/04/16 235 lb (106.6 kg)  01/28/16 233 lb (105.7 kg)    Physical Exam  Constitutional: He is oriented to person, place, and time. He appears well-developed and  well-nourished. No distress.  HENT:  Head: Atraumatic.  Eyes: Conjunctivae are normal. Pupils are equal, round, and reactive to light. No scleral icterus.  Neck: Normal range of motion. Neck supple.  Cardiovascular: Normal rate and normal heart sounds.   Pulmonary/Chest: Effort normal and breath sounds normal. No respiratory distress.  Abdominal: Soft. Bowel sounds are normal. There is no tenderness.  Musculoskeletal: Normal range of motion.  Neurological: He is alert and oriented to person, place, and time.  Skin: Skin is warm and dry.  Psychiatric: He has a normal mood and affect. His behavior is normal.  Nursing note and vitals reviewed.   Results for orders placed or performed in visit on 11/13/16  Microscopic Examination  Result Value Ref Range   WBC, UA None seen 0 - 5 /hpf   RBC, UA None seen 0 - 2 /hpf   Epithelial Cells (non renal) None  seen 0 - 10 /hpf   Bacteria, UA None seen None seen/Few  Lipid Panel w/o Chol/HDL Ratio  Result Value Ref Range   Cholesterol, Total 215 (H) 100 - 199 mg/dL   Triglycerides 161255 (H) 0 - 149 mg/dL   HDL 37 (L) >09>39 mg/dL   VLDL Cholesterol Cal 51 (H) 5 - 40 mg/dL   LDL Calculated 604127 (H) 0 - 99 mg/dL  HgB V4UA1c  Result Value Ref Range   Hgb A1c MFr Bld 8.7 (H) 4.8 - 5.6 %   Est. average glucose Bld gHb Est-mCnc 203 mg/dL  UA/M w/rflx Culture, Routine  Result Value Ref Range   Specific Gravity, UA >1.030 (H) 1.005 - 1.030   pH, UA 5.5 5.0 - 7.5   Color, UA Yellow Yellow   Appearance Ur Clear Clear   Leukocytes, UA Negative Negative   Protein, UA Negative Negative/Trace   Glucose, UA Trace (A) Negative   Ketones, UA Negative Negative   RBC, UA Negative Negative   Bilirubin, UA Negative Negative   Urobilinogen, Ur 0.2 0.2 - 1.0 mg/dL   Nitrite, UA Negative Negative   Microscopic Examination See below:   Comprehensive metabolic panel  Result Value Ref Range   Glucose 205 (H) 65 - 99 mg/dL   BUN 15 6 - 24 mg/dL   Creatinine, Ser 9.811.01 0.76 - 1.27 mg/dL   GFR calc non Af Amer 88 >59 mL/min/1.73   GFR calc Af Amer 102 >59 mL/min/1.73   BUN/Creatinine Ratio 15 9 - 20   Sodium 141 134 - 144 mmol/L   Potassium 4.6 3.5 - 5.2 mmol/L   Chloride 102 96 - 106 mmol/L   CO2 25 18 - 29 mmol/L   Calcium 9.4 8.7 - 10.2 mg/dL   Total Protein 7.1 6.0 - 8.5 g/dL   Albumin 4.5 3.5 - 5.5 g/dL   Globulin, Total 2.6 1.5 - 4.5 g/dL   Albumin/Globulin Ratio 1.7 1.2 - 2.2   Bilirubin Total 0.3 0.0 - 1.2 mg/dL   Alkaline Phosphatase 55 39 - 117 IU/L   AST 16 0 - 40 IU/L   ALT 17 0 - 44 IU/L      Assessment & Plan:   Problem List Items Addressed This Visit      Endocrine   Diabetes mellitus with hyperglycemia (HCC)    Await labs. Restart metformin 1000 BID. Long discussion about dietary changes that need to be in place to help reduce his A1C. Patient agreeable to working hard on some changes. [facility]       Relevant  Medications   lisinopril (PRINIVIL,ZESTRIL) 5 MG tablet   lisinopril-hydrochlorothiazide (PRINZIDE,ZESTORETIC) 20-25 MG tablet   metformin (FORTAMET) 1000 MG (OSM) 24 hr tablet   rosuvastatin (CRESTOR) 40 MG tablet   Other Relevant Orders   HgB A1c (Completed)   UA/M w/rflx Culture, Routine (Completed)     Genitourinary   Benign hypertensive renal disease    Restart 5 mg lisinopril and lisinopril HCTZ. DASH diet discussed. Work on increasing exercise. Will see how things are going in 3 months and consider adding amlodipine if poor control      Relevant Orders   UA/M w/rflx Culture, Routine (Completed)   Comprehensive metabolic panel (Completed)     Other   Hyperlipidemia - Primary    Await labs. Not fasting today, will fast for his next follow up appt. Restart cholesterol, continue cutting back on high fat/fried foods. Patient knows to follow up in the next few weeks if the crestor starts upsetting his stomach again. Discussed taking with food.       Relevant Medications   lisinopril (PRINIVIL,ZESTRIL) 5 MG tablet   lisinopril-hydrochlorothiazide (PRINZIDE,ZESTORETIC) 20-25 MG tablet   rosuvastatin (CRESTOR) 40 MG tablet   Other Relevant Orders   Lipid Panel w/o Chol/HDL Ratio (Completed)       Follow up plan: Return in about 3 months (around 02/11/2017) for A1C, fasting lipid.

## 2016-11-14 ENCOUNTER — Telehealth: Payer: Self-pay | Admitting: Family Medicine

## 2016-11-14 LAB — COMPREHENSIVE METABOLIC PANEL
ALBUMIN: 4.5 g/dL (ref 3.5–5.5)
ALT: 17 IU/L (ref 0–44)
AST: 16 IU/L (ref 0–40)
Albumin/Globulin Ratio: 1.7 (ref 1.2–2.2)
Alkaline Phosphatase: 55 IU/L (ref 39–117)
BUN/Creatinine Ratio: 15 (ref 9–20)
BUN: 15 mg/dL (ref 6–24)
Bilirubin Total: 0.3 mg/dL (ref 0.0–1.2)
CALCIUM: 9.4 mg/dL (ref 8.7–10.2)
CO2: 25 mmol/L (ref 18–29)
Chloride: 102 mmol/L (ref 96–106)
Creatinine, Ser: 1.01 mg/dL (ref 0.76–1.27)
GFR, EST AFRICAN AMERICAN: 102 mL/min/{1.73_m2} (ref >59)
GFR, EST NON AFRICAN AMERICAN: 88 mL/min/{1.73_m2} (ref >59)
GLUCOSE: 205 mg/dL — AB (ref 65–99)
Globulin, Total: 2.6 g/dL (ref 1.5–4.5)
Potassium: 4.6 mmol/L (ref 3.5–5.2)
Sodium: 141 mmol/L (ref 134–144)
TOTAL PROTEIN: 7.1 g/dL (ref 6.0–8.5)

## 2016-11-14 LAB — LIPID PANEL W/O CHOL/HDL RATIO
CHOLESTEROL TOTAL: 215 mg/dL — AB (ref 100–199)
HDL: 37 mg/dL — AB (ref >39)
LDL Calculated: 127 mg/dL — ABNORMAL HIGH (ref 0–99)
Triglycerides: 255 mg/dL — ABNORMAL HIGH (ref 0–149)
VLDL Cholesterol Cal: 51 mg/dL — ABNORMAL HIGH (ref 5–40)

## 2016-11-14 LAB — HEMOGLOBIN A1C
ESTIMATED AVERAGE GLUCOSE: 203 mg/dL
Hgb A1c MFr Bld: 8.7 % — ABNORMAL HIGH (ref 4.8–5.6)

## 2016-11-14 NOTE — Telephone Encounter (Signed)
Called and left a message for patient to return my call.  

## 2016-11-14 NOTE — Telephone Encounter (Signed)
Please call pt and let him know that all of his labs looked pretty stable from previous. With good diet and exercise modifications as well as good medication compliance now that he's got all of his meds again, these numbers will likely come down quite a bit by his 3 month follow up. Encourage good lifestyle choices. He should let us know if he continues to have stomach upset with his crestor and we can adjust it

## 2016-11-14 NOTE — Assessment & Plan Note (Addendum)
Await labs. Not fasting today, will fast for his next follow up appt. Restart cholesterol, continue cutting back on high fat/fried foods. Patient knows to follow up in the next few weeks if the crestor starts upsetting his stomach again. Discussed taking with food.

## 2016-11-14 NOTE — Telephone Encounter (Signed)
Patient notified

## 2016-11-14 NOTE — Assessment & Plan Note (Signed)
Restart 5 mg lisinopril and lisinopril HCTZ. DASH diet discussed. Work on increasing exercise. Will see how things are going in 3 months and consider adding amlodipine if poor control

## 2016-11-14 NOTE — Assessment & Plan Note (Addendum)
Await labs. Restart metformin 1000 BID. Long discussion about dietary changes that need to be in place to help reduce his A1C. Patient agreeable to working hard on some changes. [facility]

## 2016-12-25 ENCOUNTER — Telehealth: Payer: Self-pay | Admitting: Family Medicine

## 2016-12-25 MED ORDER — OSELTAMIVIR PHOSPHATE 75 MG PO CAPS: 75.0000 mg | ORAL_CAPSULE | Freq: Every day | ORAL | 0 refills | Status: DC

## 2016-12-25 NOTE — Telephone Encounter (Signed)
Patients grandson has been diagnosed with the flu and the patient needs Tamiflu called in for himself if possible since he has been exposed to the flu.  Please advise.   Thanks  Clydie BraunKaren  [phone]

## 2016-12-25 NOTE — Telephone Encounter (Signed)
Routing to provider  
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
## 2017-02-18 ENCOUNTER — Ambulatory Visit: Payer: No Typology Code available for payment source | Admitting: Family Medicine

## 2017-02-19 ENCOUNTER — Other Ambulatory Visit: Payer: Self-pay | Admitting: Family Medicine

## 2017-02-19 NOTE — Telephone Encounter (Signed)
Routing to provider. Follow up on 02/27/17.

## 2017-02-27 ENCOUNTER — Ambulatory Visit: Payer: No Typology Code available for payment source | Admitting: Family Medicine

## 2017-03-21 ENCOUNTER — Other Ambulatory Visit: Payer: Self-pay | Admitting: Family Medicine

## 2017-03-23 NOTE — Telephone Encounter (Signed)
Routing to provider. No follow up on file. 

## 2017-04-27 ENCOUNTER — Other Ambulatory Visit: Payer: Self-pay | Admitting: Family Medicine

## 2017-04-28 ENCOUNTER — Other Ambulatory Visit: Payer: Self-pay | Admitting: Family Medicine

## 2017-05-20 ENCOUNTER — Other Ambulatory Visit: Payer: Self-pay | Admitting: Family Medicine

## 2017-05-21 NOTE — Telephone Encounter (Signed)
Routing to provider. No follow up scheduled. 

## 2017-05-22 NOTE — Telephone Encounter (Signed)
Routing to provider. No follow up on file. 

## 2017-05-25 ENCOUNTER — Other Ambulatory Visit: Payer: Self-pay | Admitting: Family Medicine

## 2017-05-25 MED ORDER — LISINOPRIL-HYDROCHLOROTHIAZIDE 20-25 MG PO TABS: 1.0000 | ORAL_TABLET | Freq: Every day | ORAL | 0 refills | Status: DC

## 2017-05-25 MED ORDER — METFORMIN HCL ER (OSM) 1000 MG PO TB24: ORAL_TABLET | ORAL | 0 refills | Status: DC

## 2017-05-25 MED ORDER — LISINOPRIL 5 MG PO TABS: ORAL_TABLET | ORAL | 0 refills | Status: DC

## 2017-05-25 NOTE — Telephone Encounter (Signed)
Pt would like to get a refill for lisinopril (PRINIVIL,ZESTRIL) 5 MG tablet , lisinopril-hydrochlorothiazide (PRINZIDE,ZESTORETIC) 20-25 MG tablet and metformin (FORTAMET) 1000 MG (OSM) 24 hr tablet sent to [facility].

## 2017-05-25 NOTE — Telephone Encounter (Signed)
Routing to provider. Has upcoming appointment with Fleet Contrasachel.

## 2017-06-03 ENCOUNTER — Encounter: Payer: Self-pay | Admitting: Family Medicine

## 2017-06-03 ENCOUNTER — Ambulatory Visit (INDEPENDENT_AMBULATORY_CARE_PROVIDER_SITE_OTHER): Payer: BLUE CROSS/BLUE SHIELD | Admitting: Family Medicine

## 2017-06-03 VITALS — BP 143/93 | HR 94 | Temp 99.0°F | Wt 230.0 lb

## 2017-06-03 DIAGNOSIS — E782 Mixed hyperlipidemia: Secondary | ICD-10-CM

## 2017-06-03 DIAGNOSIS — E1165 Type 2 diabetes mellitus with hyperglycemia: Secondary | ICD-10-CM

## 2017-06-03 DIAGNOSIS — I129 Hypertensive chronic kidney disease with stage 1 through stage 4 chronic kidney disease, or unspecified chronic kidney disease: Secondary | ICD-10-CM

## 2017-06-03 DIAGNOSIS — M79671 Pain in right foot: Secondary | ICD-10-CM

## 2017-06-03 MED ORDER — ROSUVASTATIN CALCIUM 20 MG PO TABS: 20.0000 mg | ORAL_TABLET | Freq: Every day | ORAL | 3 refills | Status: DC

## 2017-06-03 NOTE — Progress Notes (Signed)
BP (!) 143/93   Pulse 94   Temp 99 F (37.2 C)   Wt 230 lb (104.3 kg)   SpO2 99%   BMI 31.28 kg/m    Subjective:    Patient ID: Greg Wood, male    DOB: July 03, 1971, 46 y.o.   MRN: [id number]  HPI: Greg Wood is a 46 y.o. male  Chief Complaint  Patient presents with  . Diabetes  . Hypertension  . Hyperlipidemia    He never started back on the Crestor after having side effects   Patient presents today for chronic illness f/u. Ran out of medications for about a month. Has been back on medications for about a week now.   DM - does not check home BS's. Taking metformin faithfully without side effects. States he's been working some on improving diet and exercising more. No low blood sugar spells.   HTN - Does not check home BPs. Doing well with lisinopril HCTZ and 5 mg lisinopril. Denies CP, SOB, palpitations, HAs.   Never restarted his statin, stated at previous appt that he felt it was giving him GI upset so he had d/c'd and decided not to try again with night dosing as discussed previously. Did eat pizza a few hours before coming in today.   Does also c/o intermittent issues with right heel pain, swelling, redness. Having minimal sxs currently, but will sometimes have flares that cause him to barely be able to bear weight. Wears work boots the majority of the time as he is required to for work. Denies hx of gout, alcohol use, seafood. Does not improve as the day goes on when pain is present. Feels more like a deep aching pain than pins and needles. No known injury.   Past Medical History:  Diagnosis Date  . Acute bronchitis   . Allergic rhinitis due to pollen   . Blood in the urine   . Diabetes mellitus without complication (HCC)   . Hypercalcemia    Resolved on recheck  . Hyperlipidemia   . Hypertension    Social History   Social History  . Marital status: Single    Spouse name: N/A  . Number of children: N/A  . Years of education: N/A   Occupational History    . Not on file.   Social History Main Topics  . Smoking status: Never Smoker  . Smokeless tobacco: Never Used  . Alcohol use No  . Drug use: No  . Sexual activity: Yes    Birth control/ protection: None   Other Topics Concern  . Not on file   Social History Narrative  . No narrative on file    Relevant past medical, surgical, family and social history reviewed and updated as indicated. Interim medical history since our last visit reviewed. Allergies and medications reviewed and updated.  Review of Systems  Constitutional: Negative.   HENT: Negative.   Eyes: Negative.   Respiratory: Negative.   Cardiovascular: Negative.   Gastrointestinal: Negative.   Genitourinary: Negative.   Musculoskeletal: Negative.   Neurological: Negative.   Psychiatric/Behavioral: Negative.    Per HPI unless specifically indicated above     Objective:    BP (!) 143/93   Pulse 94   Temp 99 F (37.2 C)   Wt 230 lb (104.3 kg)   SpO2 99%   BMI 31.28 kg/m   Wt Readings from Last 3 Encounters:  06/03/17 230 lb (104.3 kg)  11/13/16 234 lb 6.4 oz (106.3 kg)  02/04/16 235 lb (106.6 kg)    Physical Exam  Constitutional: He is oriented to person, place, and time. He appears well-developed and well-nourished. No distress.  HENT:  Head: Atraumatic.  Eyes: Pupils are equal, round, and reactive to light. Conjunctivae are normal. No scleral icterus.  Neck: Normal range of motion. Neck supple.  Cardiovascular: Normal rate and normal heart sounds.   Pulmonary/Chest: Effort normal and breath sounds normal.  Musculoskeletal: Normal range of motion. He exhibits tenderness (minimal ttp over right heel). He exhibits no edema or deformity.  Neurological: He is alert and oriented to person, place, and time.  Skin: Skin is warm and dry.  Psychiatric: He has a normal mood and affect. His behavior is normal.  Nursing note and vitals reviewed.  Results for orders placed or performed in visit on 06/03/17   Bayer DCA Hb A1c Waived  Result Value Ref Range   Bayer DCA Hb A1c Waived 7.1 (H) <7.0 %  Microalbumin, Urine Waived  Result Value Ref Range   Microalb, Ur Waived 10 0 - 19 mg/L   Creatinine, Urine Waived 200 10 - 300 mg/dL   Microalb/Creat Ratio <30 <30 mg/g  Lipid Panel Piccolo, Waived  Result Value Ref Range   Cholesterol Piccolo, Waived WILL FOLLOW    HDL Chol Piccolo, Waived WILL FOLLOW    Triglycerides Piccolo,Waived WILL FOLLOW    Chol/HDL Ratio Piccolo,Waive WILL FOLLOW    LDL Chol Calc Piccolo Waived WILL FOLLOW    VLDL Chol Calc Piccolo,Waive WILL FOLLOW   Comprehensive metabolic panel  Result Value Ref Range   Glucose 112 (H) 65 - 99 mg/dL   BUN 14 6 - 24 mg/dL   Creatinine, Ser 1.61 0.76 - 1.27 mg/dL   GFR calc non Af Amer 85 >59 mL/min/1.73   GFR calc Af Amer 98 >59 mL/min/1.73   BUN/Creatinine Ratio 13 9 - 20   Sodium 141 134 - 144 mmol/L   Potassium 5.1 3.5 - 5.2 mmol/L   Chloride 100 96 - 106 mmol/L   CO2 22 20 - 29 mmol/L   Calcium 10.5 (H) 8.7 - 10.2 mg/dL   Total Protein 7.6 6.0 - 8.5 g/dL   Albumin 4.7 3.5 - 5.5 g/dL   Globulin, Total 2.9 1.5 - 4.5 g/dL   Albumin/Globulin Ratio 1.6 1.2 - 2.2   Bilirubin Total 0.3 0.0 - 1.2 mg/dL   Alkaline Phosphatase 62 39 - 117 IU/L   AST 18 0 - 40 IU/L   ALT 22 0 - 44 IU/L  Uric acid  Result Value Ref Range   Uric Acid 7.7 3.7 - 8.6 mg/dL  Lipid Panel w/o Chol/HDL Ratio  Result Value Ref Range   Cholesterol, Total 216 (H) 100 - 199 mg/dL   Triglycerides 096 (H) 0 - 149 mg/dL   HDL 38 (L) >04 mg/dL   VLDL Cholesterol Cal 77 (H) 5 - 40 mg/dL   LDL Calculated 540 (H) 0 - 99 mg/dL  Specimen status report  Result Value Ref Range   specimen status report Comment       Assessment & Plan:   Problem List Items Addressed This Visit      Endocrine   Diabetes mellitus with hyperglycemia (HCC)    A1C improved to 7.1. Pt opting to continue metformin alone and will continue improving lifestyle factors in hopes of  getting to goal without more medication. Will re-evaluate in 3 months. Congratulated progress      Relevant Medications   rosuvastatin (CRESTOR)  20 MG tablet   Other Relevant Orders   Bayer DCA Hb A1c Waived (Completed)   Microalbumin, Urine Waived (Completed)     Genitourinary   Benign hypertensive renal disease    Stable and WNL on current regimen. Continue lisinopril with lisinopril HCTZ.       Relevant Orders   Microalbumin, Urine Waived (Completed)     Other   Hyperlipidemia - Primary    Await non-fasting lipid panel, re-iterated recommendation to restart the cholesterol medication to reduce CV risk. Pt agreeable to trying the night dosing schedule as well as continued lifestyle modifications.       Relevant Medications   rosuvastatin (CRESTOR) 20 MG tablet   Other Relevant Orders   Lipid Panel Piccolo, Waived (Completed)   Comprehensive metabolic panel (Completed)    Other Visit Diagnoses    Pain of right heel       Will check uric acid. Discussed potential plantar fasciitis, will try heel inserts into work boots, stretches, soaks, NSAIDs. May do x-ray if no improvement   Relevant Orders   Uric acid       Follow up plan: Return in about 3 months (around 09/03/2017) for CPE.

## 2017-06-04 LAB — COMPREHENSIVE METABOLIC PANEL
ALT: 22 IU/L (ref 0–44)
AST: 18 IU/L (ref 0–40)
Albumin/Globulin Ratio: 1.6 (ref 1.2–2.2)
Albumin: 4.7 g/dL (ref 3.5–5.5)
Alkaline Phosphatase: 62 IU/L (ref 39–117)
BILIRUBIN TOTAL: 0.3 mg/dL (ref 0.0–1.2)
BUN/Creatinine Ratio: 13 (ref 9–20)
BUN: 14 mg/dL (ref 6–24)
CHLORIDE: 100 mmol/L (ref 96–106)
CO2: 22 mmol/L (ref 20–29)
CREATININE: 1.04 mg/dL (ref 0.76–1.27)
Calcium: 10.5 mg/dL — ABNORMAL HIGH (ref 8.7–10.2)
GFR calc Af Amer: 98 mL/min/{1.73_m2} (ref >59)
GFR calc non Af Amer: 85 mL/min/{1.73_m2} (ref >59)
GLOBULIN, TOTAL: 2.9 g/dL (ref 1.5–4.5)
GLUCOSE: 112 mg/dL — AB (ref 65–99)
Potassium: 5.1 mmol/L (ref 3.5–5.2)
SODIUM: 141 mmol/L (ref 134–144)
Total Protein: 7.6 g/dL (ref 6.0–8.5)

## 2017-06-04 LAB — LIPID PANEL PICCOLO, WAIVED

## 2017-06-04 LAB — URIC ACID: URIC ACID: 7.7 mg/dL (ref 3.7–8.6)

## 2017-06-05 ENCOUNTER — Telehealth: Payer: Self-pay | Admitting: Family Medicine

## 2017-06-05 LAB — LIPID PANEL W/O CHOL/HDL RATIO
CHOLESTEROL TOTAL: 216 mg/dL — AB (ref 100–199)
HDL: 38 mg/dL — ABNORMAL LOW (ref >39)
LDL Calculated: 101 mg/dL — ABNORMAL HIGH (ref 0–99)
Triglycerides: 385 mg/dL — ABNORMAL HIGH (ref 0–149)
VLDL CHOLESTEROL CAL: 77 mg/dL — AB (ref 5–40)

## 2017-06-05 LAB — MICROALBUMIN, URINE WAIVED
Creatinine, Urine Waived: 200 mg/dL (ref 10–300)
Microalb, Ur Waived: 10 mg/L (ref 0–19)
Microalb/Creat Ratio: <30 mg/g (ref <30)

## 2017-06-05 LAB — BAYER DCA HB A1C WAIVED: HB A1C: 7.1 % — AB (ref <7.0)

## 2017-06-05 LAB — SPECIMEN STATUS REPORT

## 2017-06-05 NOTE — Assessment & Plan Note (Signed)
Await non-fasting lipid panel, re-iterated recommendation to restart the cholesterol medication to reduce CV risk. Pt agreeable to trying the night dosing schedule as well as continued lifestyle modifications.

## 2017-06-05 NOTE — Assessment & Plan Note (Signed)
Stable and WNL on current regimen. Continue lisinopril with lisinopril HCTZ.

## 2017-06-05 NOTE — Telephone Encounter (Signed)
Left message to call.

## 2017-06-05 NOTE — Telephone Encounter (Signed)
Please call and let him know that his labs came back normal other than the A1C, which we addressed at his appointment, and his cholesterol is still higher than we like. Encourage him to get back on the cholesterol medication and continue to make good lifestyle modifications and we will recheck fasting labs next time  His uric acid level came back normal, so he can continue taking OTC pain relievers for that foot pain and let us know if he wants to have an x-ray done if things aren't improving

## 2017-06-05 NOTE — Assessment & Plan Note (Signed)
A1C improved to 7.1. Pt opting to continue metformin alone and will continue improving lifestyle factors in hopes of getting to goal without more medication. Will re-evaluate in 3 months. Congratulated progress

## 2017-06-08 NOTE — Telephone Encounter (Signed)
Left message to call.

## 2017-06-09 NOTE — Telephone Encounter (Signed)
Patient called to return call to CMA.  Informed patient CMA is out of office this afternoon.Patient would like for CMA to give him a call once she is back in office.   Please Advise.  Thank you

## 2017-06-09 NOTE — Telephone Encounter (Signed)
Left message to call.

## 2017-06-10 NOTE — Telephone Encounter (Signed)
Info relayed to pt via mychart

## 2017-06-10 NOTE — Telephone Encounter (Signed)
Patient's wife called and wanted to inform office patient is unable to answer calls while he is at work due to his job been strict on phones. Patient has been aware of missed calls also. Patient will come in to fill out DPR when possible.   Please Advise.  Thank you

## 2017-06-10 NOTE — Telephone Encounter (Signed)
Called and left patient a VM asking for him to please return our call.

## 2017-06-25 ENCOUNTER — Other Ambulatory Visit: Payer: Self-pay | Admitting: Family Medicine

## 2017-07-27 ENCOUNTER — Other Ambulatory Visit: Payer: Self-pay | Admitting: Family Medicine

## 2017-07-28 NOTE — Telephone Encounter (Signed)
Needs an appointment. Also needs to keep it.

## 2017-07-31 NOTE — Telephone Encounter (Signed)
Patient scheduled for 08/06/2017 at 10:45 pm.

## 2017-07-31 NOTE — Telephone Encounter (Signed)
Patient needing refill on each of his medications except for his cholesterol medication. Patient unaware of the name of his cholesterol medication. Patient states he is completely out of medication.  Please Advise.  [employer]

## 2017-07-31 NOTE — Telephone Encounter (Signed)
Please get patient scheduled.  °

## 2017-08-06 ENCOUNTER — Ambulatory Visit (INDEPENDENT_AMBULATORY_CARE_PROVIDER_SITE_OTHER): Payer: BLUE CROSS/BLUE SHIELD | Admitting: Family Medicine

## 2017-08-06 ENCOUNTER — Encounter: Payer: Self-pay | Admitting: Family Medicine

## 2017-08-06 VITALS — BP 169/112 | HR 89 | Temp 99.3°F | Wt 237.6 lb

## 2017-08-06 DIAGNOSIS — I129 Hypertensive chronic kidney disease with stage 1 through stage 4 chronic kidney disease, or unspecified chronic kidney disease: Secondary | ICD-10-CM

## 2017-08-06 DIAGNOSIS — E782 Mixed hyperlipidemia: Secondary | ICD-10-CM | POA: Diagnosis not present

## 2017-08-06 DIAGNOSIS — R319 Hematuria, unspecified: Secondary | ICD-10-CM | POA: Diagnosis not present

## 2017-08-06 DIAGNOSIS — Z125 Encounter for screening for malignant neoplasm of prostate: Secondary | ICD-10-CM | POA: Diagnosis not present

## 2017-08-06 DIAGNOSIS — E1165 Type 2 diabetes mellitus with hyperglycemia: Secondary | ICD-10-CM | POA: Diagnosis not present

## 2017-08-06 LAB — UA/M W/RFLX CULTURE, ROUTINE
BILIRUBIN UA: NEGATIVE
GLUCOSE, UA: NEGATIVE
KETONES UA: NEGATIVE
Leukocytes, UA: NEGATIVE
NITRITE UA: NEGATIVE
Protein, UA: NEGATIVE
RBC UA: NEGATIVE
SPEC GRAV UA: 1.01 (ref 1.005–1.030)
UUROB: 0.2 mg/dL (ref 0.2–1.0)
pH, UA: 7 (ref 5.0–7.5)

## 2017-08-06 LAB — MICROSCOPIC EXAMINATION: Bacteria, UA: NONE SEEN

## 2017-08-06 LAB — BAYER DCA HB A1C WAIVED: HB A1C (BAYER DCA - WAIVED): 8.1 % — ABNORMAL HIGH (ref <7.0)

## 2017-08-06 LAB — MICROALBUMIN, URINE WAIVED
Creatinine, Urine Waived: 50 mg/dL (ref 10–300)
MICROALB, UR WAIVED: 10 mg/L (ref 0–19)

## 2017-08-06 MED ORDER — NAPROXEN 500 MG PO TABS: 500.0000 mg | ORAL_TABLET | Freq: Two times a day (BID) | ORAL | 0 refills | Status: DC

## 2017-08-06 MED ORDER — LISINOPRIL 5 MG PO TABS: 5.0000 mg | ORAL_TABLET | Freq: Every day | ORAL | 0 refills | Status: DC

## 2017-08-06 MED ORDER — METFORMIN HCL ER (OSM) 1000 MG PO TB24: ORAL_TABLET | ORAL | 0 refills | Status: DC

## 2017-08-06 MED ORDER — ROSUVASTATIN CALCIUM 20 MG PO TABS: 20.0000 mg | ORAL_TABLET | Freq: Every day | ORAL | 0 refills | Status: DC

## 2017-08-06 MED ORDER — LISINOPRIL-HYDROCHLOROTHIAZIDE 20-25 MG PO TABS: 1.0000 | ORAL_TABLET | Freq: Every day | ORAL | 0 refills | Status: DC

## 2017-08-06 NOTE — Assessment & Plan Note (Signed)
Has been off his medicine for 3 weeks. Restart medicine. Recheck 1 month. Call with any concerns.

## 2017-08-06 NOTE — Patient Instructions (Addendum)

## 2017-08-06 NOTE — Assessment & Plan Note (Signed)
Continue crestor. Rechecking levels today. Continue current regimen. Continue to monitor. Call with any concerns.

## 2017-08-06 NOTE — Assessment & Plan Note (Signed)
UA clear today. Resolving off list.

## 2017-08-06 NOTE — Assessment & Plan Note (Signed)
Not under good control A1c 8.1. Restart metformin. Follow up 1 month.

## 2017-08-06 NOTE — Progress Notes (Signed)
BP (!) 169/112 (BP Location: Left Arm, Patient Position: Sitting, Cuff Size: Large)   Pulse 89   Temp 99.3 F (37.4 C)   Wt 237 lb 9 oz (107.8 kg)   SpO2 99%   BMI 32.31 kg/m    Subjective:    Patient ID: Greg Wood, male    DOB: 11-14-1971, 46 y.o.   MRN: [id number]  HPI: Wood Greg is a 46 y.o. male  Chief Complaint  Patient presents with  . Hypertension  . Hyperlipidemia  . Diabetes   HYPERTENSION / HYPERLIPIDEMIA- has been out of his medicine for about 3 weeks Satisfied with current treatment? no Duration of hypertension: chronic BP monitoring frequency: not checking BP range:  BP medication side effects: no Past BP meds: lisinopril-HCTZ Duration of hyperlipidemia: chronic Cholesterol medication side effects: no Cholesterol supplements: none Past cholesterol medications: rosuvastatin (crestor) Medication compliance: poor compliance Aspirin: no Recent stressors: no Recurrent headaches: no Visual changes: no Palpitations: no Dyspnea: no Chest pain: no Lower extremity edema: no Dizzy/lightheaded: no  DIABETES Hypoglycemic episodes:no Polydipsia/polyuria: no Visual disturbance: no Chest pain: no Paresthesias: no Glucose Monitoring: no  Accucheck frequency: Not Checking Taking Insulin?: no Blood Pressure Monitoring: not checking Retinal Examination: Not up to Date Foot Exam: Up to Date Diabetic Education: Not Completed Pneumovax: Up to Date Influenza: Not up to Date Aspirin: no   KNEE PAIN Duration: 3 weeks Involved knee: right Mechanism of injury: unknown Location:anterior Onset: sudden Severity: mild  Quality:  Sore and tight Frequency: intermittent Radiation: no Aggravating factors: bending and walking   Alleviating factors: heat   Status: stable Treatments attempted: rest, heat, APAP and ibuprofen  Relief with NSAIDs?:  unknown Weakness with weight bearing or walking: no Sensation of giving way: no Locking: no Popping:  yes Bruising: no Swelling: yes Redness: no Paresthesias/decreased sensation: no Fevers: no   Relevant past medical, surgical, family and social history reviewed and updated as indicated. Interim medical history since our last visit reviewed. Allergies and medications reviewed and updated.  Review of Systems  Constitutional: Negative.   Respiratory: Negative.   Cardiovascular: Negative.   Musculoskeletal: Positive for arthralgias. Negative for back pain, gait problem, joint swelling, myalgias, neck pain and neck stiffness.  Psychiatric/Behavioral: Negative.     Per HPI unless specifically indicated above     Objective:    BP (!) 169/112 (BP Location: Left Arm, Patient Position: Sitting, Cuff Size: Large)   Pulse 89   Temp 99.3 F (37.4 C)   Wt 237 lb 9 oz (107.8 kg)   SpO2 99%   BMI 32.31 kg/m   Wt Readings from Last 3 Encounters:  08/06/17 237 lb 9 oz (107.8 kg)  06/03/17 230 lb (104.3 kg)  11/13/16 234 lb 6.4 oz (106.3 kg)    Physical Exam  Constitutional: He is oriented to person, place, and time. He appears well-developed and well-nourished. No distress.  HENT:  Head: Normocephalic and atraumatic.  Right Ear: Hearing normal.  Left Ear: Hearing normal.  Nose: Nose normal.  Eyes: Conjunctivae and lids are normal. Right eye exhibits no discharge. Left eye exhibits no discharge. No scleral icterus.  Cardiovascular: Normal rate, regular rhythm, normal heart sounds and intact distal pulses.  Exam reveals no gallop and no friction rub.   No murmur heard. Pulmonary/Chest: Effort normal and breath sounds normal. No respiratory distress. He has no wheezes. He has no rales. He exhibits no tenderness.  Musculoskeletal: He exhibits tenderness (medial joint line). He exhibits no edema  or deformity.  Negative mcmurrays, negative lachman's, negative appley's compression and distraction  Neurological: He is alert and oriented to person, place, and time.  Skin: Skin is warm, dry  and intact. No rash noted. No pallor.  Psychiatric: He has a normal mood and affect. His speech is normal and behavior is normal. Judgment and thought content normal. Cognition and memory are normal.    Results for orders placed or performed in visit on 06/03/17  Bayer DCA Hb A1c Waived  Result Value Ref Range   Bayer DCA Hb A1c Waived 7.1 (H) <7.0 %  Microalbumin, Urine Waived  Result Value Ref Range   Microalb, Ur Waived 10 0 - 19 mg/L   Creatinine, Urine Waived 200 10 - 300 mg/dL   Microalb/Creat Ratio <30 <30 mg/g  Lipid Panel Piccolo, Waived  Result Value Ref Range   Cholesterol Piccolo, Waived WILL FOLLOW    HDL Chol Piccolo, Waived WILL FOLLOW    Triglycerides Piccolo,Waived WILL FOLLOW    Chol/HDL Ratio Piccolo,Waive WILL FOLLOW    LDL Chol Calc Piccolo Waived WILL FOLLOW    VLDL Chol Calc Piccolo,Waive WILL FOLLOW   Comprehensive metabolic panel  Result Value Ref Range   Glucose 112 (H) 65 - 99 mg/dL   BUN 14 6 - 24 mg/dL   Creatinine, Ser 1.611.04 0.76 - 1.27 mg/dL   GFR calc non Af Amer 85 >59 mL/min/1.73   GFR calc Af Amer 98 >59 mL/min/1.73   BUN/Creatinine Ratio 13 9 - 20   Sodium 141 134 - 144 mmol/L   Potassium 5.1 3.5 - 5.2 mmol/L   Chloride 100 96 - 106 mmol/L   CO2 22 20 - 29 mmol/L   Calcium 10.5 (H) 8.7 - 10.2 mg/dL   Total Protein 7.6 6.0 - 8.5 g/dL   Albumin 4.7 3.5 - 5.5 g/dL   Globulin, Total 2.9 1.5 - 4.5 g/dL   Albumin/Globulin Ratio 1.6 1.2 - 2.2   Bilirubin Total 0.3 0.0 - 1.2 mg/dL   Alkaline Phosphatase 62 39 - 117 IU/L   AST 18 0 - 40 IU/L   ALT 22 0 - 44 IU/L  Uric acid  Result Value Ref Range   Uric Acid 7.7 3.7 - 8.6 mg/dL  Lipid Panel w/o Chol/HDL Ratio  Result Value Ref Range   Cholesterol, Total 216 (H) 100 - 199 mg/dL   Triglycerides 096385 (H) 0 - 149 mg/dL   HDL 38 (L) >04>39 mg/dL   VLDL Cholesterol Cal 77 (H) 5 - 40 mg/dL   LDL Calculated 540101 (H) 0 - 99 mg/dL  Specimen status report  Result Value Ref Range   specimen status report  Comment       Assessment & Plan:   Problem List Items Addressed This Visit      Endocrine   Diabetes mellitus with hyperglycemia (HCC) - Primary    Not under good control A1c 8.1. Restart metformin. Follow up 1 month.       Relevant Medications   rosuvastatin (CRESTOR) 20 MG tablet   metformin (FORTAMET) 1000 MG (OSM) 24 hr tablet   lisinopril-hydrochlorothiazide (PRINZIDE,ZESTORETIC) 20-25 MG tablet   lisinopril (PRINIVIL,ZESTRIL) 5 MG tablet   Other Relevant Orders   Bayer DCA Hb A1c Waived   Comprehensive metabolic panel   TSH     Genitourinary   Benign hypertensive renal disease    Has been off his medicine for 3 weeks. Restart medicine. Recheck 1 month. Call with any concerns.  Relevant Orders   CBC with Differential/Platelet   Comprehensive metabolic panel   Microalbumin, Urine Waived   TSH     Other   Blood in the urine    UA clear today. Resolving off list.       Relevant Orders   CBC with Differential/Platelet   Comprehensive metabolic panel   UA/M w/rflx Culture, Routine   Hyperlipidemia    Continue crestor. Rechecking levels today. Continue current regimen. Continue to monitor. Call with any concerns.       Relevant Medications   rosuvastatin (CRESTOR) 20 MG tablet   lisinopril-hydrochlorothiazide (PRINZIDE,ZESTORETIC) 20-25 MG tablet   lisinopril (PRINIVIL,ZESTRIL) 5 MG tablet   Other Relevant Orders   Comprehensive metabolic panel   Lipid Panel w/o Chol/HDL Ratio    Other Visit Diagnoses    Screening for prostate cancer       Labs drawn today. Await results.    Relevant Orders   PSA       Follow up plan: Return in about 4 weeks (around 09/03/2017) for Follow up knee and BP.

## 2017-08-07 LAB — CBC WITH DIFFERENTIAL/PLATELET
BASOS: 1 %
Basophils Absolute: 0.1 10*3/uL (ref 0.0–0.2)
EOS (ABSOLUTE): 0.1 10*3/uL (ref 0.0–0.4)
Eos: 2 %
HEMATOCRIT: 39.7 % (ref 37.5–51.0)
Hemoglobin: 13.2 g/dL (ref 13.0–17.7)
IMMATURE GRANULOCYTES: 0 %
Immature Grans (Abs): 0 10*3/uL (ref 0.0–0.1)
LYMPHS ABS: 2.5 10*3/uL (ref 0.7–3.1)
Lymphs: 43 %
MCH: 28.1 pg (ref 26.6–33.0)
MCHC: 33.2 g/dL (ref 31.5–35.7)
MCV: 85 fL (ref 79–97)
MONOS ABS: 0.4 10*3/uL (ref 0.1–0.9)
Monocytes: 8 %
NEUTROS PCT: 46 %
Neutrophils Absolute: 2.7 10*3/uL (ref 1.4–7.0)
PLATELETS: 267 10*3/uL (ref 150–379)
RBC: 4.7 x10E6/uL (ref 4.14–5.80)
RDW: 12.8 % (ref 12.3–15.4)
WBC: 5.8 10*3/uL (ref 3.4–10.8)

## 2017-08-07 LAB — COMPREHENSIVE METABOLIC PANEL
A/G RATIO: 1.8 (ref 1.2–2.2)
ALK PHOS: 55 IU/L (ref 39–117)
ALT: 15 IU/L (ref 0–44)
AST: 17 IU/L (ref 0–40)
Albumin: 4.5 g/dL (ref 3.5–5.5)
BUN/Creatinine Ratio: 11 (ref 9–20)
BUN: 9 mg/dL (ref 6–24)
Bilirubin Total: 0.3 mg/dL (ref 0.0–1.2)
CALCIUM: 9.4 mg/dL (ref 8.7–10.2)
CHLORIDE: 103 mmol/L (ref 96–106)
CO2: 24 mmol/L (ref 20–29)
Creatinine, Ser: 0.85 mg/dL (ref 0.76–1.27)
GFR calc Af Amer: 119 mL/min/{1.73_m2} (ref >59)
GFR, EST NON AFRICAN AMERICAN: 103 mL/min/{1.73_m2} (ref >59)
GLOBULIN, TOTAL: 2.5 g/dL (ref 1.5–4.5)
Glucose: 133 mg/dL — ABNORMAL HIGH (ref 65–99)
POTASSIUM: 4.9 mmol/L (ref 3.5–5.2)
SODIUM: 142 mmol/L (ref 134–144)
Total Protein: 7 g/dL (ref 6.0–8.5)

## 2017-08-07 LAB — LIPID PANEL W/O CHOL/HDL RATIO
CHOLESTEROL TOTAL: 128 mg/dL (ref 100–199)
HDL: 44 mg/dL (ref >39)
LDL Calculated: 62 mg/dL (ref 0–99)
TRIGLYCERIDES: 108 mg/dL (ref 0–149)
VLDL Cholesterol Cal: 22 mg/dL (ref 5–40)

## 2017-08-07 LAB — PSA: Prostate Specific Ag, Serum: 0.4 ng/mL (ref 0.0–4.0)

## 2017-08-07 LAB — TSH: TSH: 1.24 u[IU]/mL (ref 0.450–4.500)

## 2017-08-11 ENCOUNTER — Other Ambulatory Visit: Payer: Self-pay | Admitting: Family Medicine

## 2017-08-20 DIAGNOSIS — Z719 Counseling, unspecified: Secondary | ICD-10-CM | POA: Diagnosis not present

## 2017-09-07 ENCOUNTER — Ambulatory Visit: Payer: BLUE CROSS/BLUE SHIELD | Admitting: Family Medicine

## 2017-09-11 ENCOUNTER — Ambulatory Visit: Payer: BLUE CROSS/BLUE SHIELD | Admitting: Family Medicine

## 2017-10-09 ENCOUNTER — Other Ambulatory Visit: Payer: Self-pay | Admitting: Family Medicine

## 2017-10-25 ENCOUNTER — Encounter: Payer: Self-pay | Admitting: *Deleted

## 2017-10-25 ENCOUNTER — Other Ambulatory Visit: Payer: Self-pay

## 2017-10-25 ENCOUNTER — Ambulatory Visit
Admission: EM | Admit: 2017-10-25 | Discharge: 2017-10-25 | Disposition: A | Discharge status: Home / Self Care | Payer: BLUE CROSS/BLUE SHIELD | Attending: Emergency Medicine | Admitting: Emergency Medicine

## 2017-10-25 ENCOUNTER — Ambulatory Visit (INDEPENDENT_AMBULATORY_CARE_PROVIDER_SITE_OTHER): Payer: BLUE CROSS/BLUE SHIELD

## 2017-10-25 DIAGNOSIS — M25461 Effusion, right knee: Secondary | ICD-10-CM | POA: Diagnosis not present

## 2017-10-25 DIAGNOSIS — M25561 Pain in right knee: Secondary | ICD-10-CM

## 2017-10-25 MED ORDER — HYDROCODONE-ACETAMINOPHEN 5-325 MG PO TABS: 1.0000 | ORAL_TABLET | Freq: Four times a day (QID) | ORAL | 0 refills | Status: DC | PRN

## 2017-10-25 MED ORDER — DICLOFENAC SODIUM 75 MG PO TBEC: 75.0000 mg | DELAYED_RELEASE_TABLET | Freq: Two times a day (BID) | ORAL | 0 refills | Status: DC

## 2017-10-25 NOTE — ED Provider Notes (Signed)
HPI  SUBJECTIVE:  Greg Wood is a 46 y.o. male who presents with 3 months of daily constant right knee pain described as achy, tight, sore.  He reports swelling.  He reports the occasional pop, denies clicking.  He denies erythema, fevers, trauma to his knee.  No fall.  No sensation of instability.  States it is giving way secondary to pain.  No distal numbness or tingling.  Patient saw his primary care physician about a month ago, was prescribed Naprosyn, missed his follow-up appointment for an x-ray.  He has tried Naprosyn without improvement of symptoms, symptoms are worse with weightbearing, bending his knee and at the end of the day.  Past medical history of diabetes, hypertension.  No history of gout, right knee injury.  Family history negative for gout.  ZOX:[id number]:Johnson, Oralia RudMegan P, DO     Past Medical History:  Diagnosis Date  . Acute bronchitis   . Allergic rhinitis due to pollen   . Blood in the urine   . Diabetes mellitus without complication (HCC)   . Hypercalcemia    Resolved on recheck  . Hyperlipidemia   . Hypertension     History reviewed. No pertinent surgical history.  Family History  Problem Relation Age of Onset  . Diabetes Mother   . Diabetes Maternal Grandmother     Social History   Tobacco Use  . Smoking status: Never Smoker  . Smokeless tobacco: Never Used  Substance Use Topics  . Alcohol use: No  . Drug use: No    No current facility-administered medications for this encounter.   Current Outpatient Medications:  .  lisinopril (PRINIVIL,ZESTRIL) 5 MG tablet, Take 1 tablet (5 mg total) by mouth daily., Disp: 90 tablet, Rfl: 0 .  lisinopril-hydrochlorothiazide (PRINZIDE,ZESTORETIC) 20-25 MG tablet, Take 1 tablet by mouth daily., Disp: 90 tablet, Rfl: 0 .  metformin (FORTAMET) 1000 MG (OSM) 24 hr tablet, TAKE 1 TABLET(1000 MG) BY MOUTH TWICE DAILY WITH A MEAL, Disp: 180 tablet, Rfl: 0 .  rosuvastatin (CRESTOR) 20 MG tablet, Take 1 tablet (20 mg total) by  mouth daily., Disp: 90 tablet, Rfl: 0 .  diclofenac (VOLTAREN) 75 MG EC tablet, Take 1 tablet (75 mg total) by mouth 2 (two) times daily. Take with food, Disp: 30 tablet, Rfl: 0 .  glucose blood test strip, 1 each by Other route as needed for other. Use as instructed, Disp: , Rfl:  .  HYDROcodone-acetaminophen (NORCO/VICODIN) 5-325 MG tablet, Take 1-2 tablets by mouth every 6 (six) hours as needed for moderate pain., Disp: 20 tablet, Rfl: 0  No Known Allergies   ROS  As noted in HPI.   Physical Exam  BP (!) 147/86 (BP Location: Left Arm)   Pulse 69   Temp 98.1 F (36.7 C) (Oral)   Resp 16   Ht 6' (1.829 m)   Wt 238 lb (108 kg)   SpO2 100%   BMI 32.28 kg/m   Constitutional: Well developed, well nourished, no acute distress Eyes:  EOMI, conjunctiva normal bilaterally HENT: Normocephalic, atraumatic,mucus membranes moist Respiratory: Normal inspiratory effort Cardiovascular: Normal rate GI: nondistended skin: No rash, skin intact Musculoskeletal: Right knee ROM  decreased due to pain, Flexion 90 degrees intact, but painful, Tenderness along the medial patellar retinaculum, Patella NT, Patellar apprehension test negative, Patellar tendon NT, Medial joint NT , Lateral joint NT, Lachman's stable, Varus LCL stress testing stable, Valgus MCL stress testing stable but painful, McMurray's testing normal, distal NVI with intact baseline sensation / motor /  pulse distal to knee. + effusion. No erythema. No increased temperature.  Neurologic: Alert & oriented x 3, no focal neuro deficits Psychiatric: Speech and behavior appropriate   ED Course   Medications - No data to display  Orders Placed This Encounter  Procedures  . DG Knee Complete 4 Views Right    Standing Status:   Standing    Number of Occurrences:   1    Order Specific Question:   Reason for Exam (SYMPTOM  OR DIAGNOSIS REQUIRED)    Answer:   r knww pain sswelling zx 3 monfths r/o effusion fx    No results found for  this or any previous visit (from the past 24 hour(s)). Dg Knee Complete 4 Views Right  Result Date: 10/25/2017 CLINICAL DATA:  Right knee pain EXAM: RIGHT KNEE - COMPLETE 4+ VIEW COMPARISON:  None. FINDINGS: Moderate joint effusion. No acute bony abnormality. Specifically, no fracture, subluxation, or dislocation. Soft tissues are intact. IMPRESSION: No acute bony abnormality. Electronically Signed   By: Charlett NoseKevin  Dover M.D.   On: 10/25/2017 12:44    ED Clinical Impression  Effusion of right knee  Acute pain of right knee   ED Assessment/Plan  [place] Narcotic database reviewed for this patient, and feel that the risk/benefit ratio today is favorable for proceeding with a prescription for controlled substance.  No opiate prescriptions in the past 2 years  Doubt gout, septic joint, hemarthrosis in the absence of trauma.  Obtaining x-rays to rule out any acute changes, but feel that patient has arthritis.  We will have him discontinue the Naprosyn since it is not working, start on diclofenac with 1 g of Tylenol twice a day, Norco to take at night, follow-up with orthopedics for definitive evaluation and imaging.  Dr. Hyacinth MeekerMiller and [facility] on call.  Ace wrap or knee sleeve, cane as needed for comfort.  Reviewed imaging independently.  Moderate joint effusion.  No acute bony abnormalities.  See radiology report for details.  See radiology report for full details.  Plan As above   discussed imaging, MDM, plan and followup with patient.  patient agrees with plan.   Meds ordered this encounter  Medications  . diclofenac (VOLTAREN) 75 MG EC tablet    Sig: Take 1 tablet (75 mg total) by mouth 2 (two) times daily. Take with food    Dispense:  30 tablet    Refill:  0  . HYDROcodone-acetaminophen (NORCO/VICODIN) 5-325 MG tablet    Sig: Take 1-2 tablets by mouth every 6 (six) hours as needed for moderate pain.    Dispense:  20 tablet    Refill:  0    *This clinic note was created using [employer] dictation  software. Therefore, there may be occasional mistakes despite careful proofreading.   ?    Domenick GongMortenson, Skya Mccullum, MD 10/25/17 1800

## 2017-10-25 NOTE — ED Triage Notes (Signed)
Patient started having right knee pain 3 months ago that became worse 3 weeks ago. Mechanism of injury unknown.

## 2017-10-25 NOTE — Discharge Instructions (Signed)
diclofenac with 1 g of Tylenol twice a day, Norco to take at night, follow-up [facility], ASAP for definitive evaluation and imaging.  Ace wrap or knee sleeve, cane as needed for comfort.  Go to the ER for fevers above 100.4, pain not controlled with medications, if the knee joint turns red or hot, or for other concerns

## 2017-11-05 DIAGNOSIS — Z008 Encounter for other general examination: Secondary | ICD-10-CM | POA: Diagnosis not present

## 2017-11-05 DIAGNOSIS — Z719 Counseling, unspecified: Secondary | ICD-10-CM | POA: Diagnosis not present

## 2017-11-05 DIAGNOSIS — E669 Obesity, unspecified: Secondary | ICD-10-CM | POA: Diagnosis not present

## 2017-11-05 DIAGNOSIS — Z6832 Body mass index (BMI) 32.0-32.9, adult: Secondary | ICD-10-CM | POA: Diagnosis not present

## 2017-11-06 ENCOUNTER — Other Ambulatory Visit: Payer: Self-pay | Admitting: Family Medicine

## 2017-11-25 ENCOUNTER — Other Ambulatory Visit: Payer: Self-pay | Admitting: Family Medicine

## 2017-11-30 ENCOUNTER — Ambulatory Visit (INDEPENDENT_AMBULATORY_CARE_PROVIDER_SITE_OTHER): Payer: BLUE CROSS/BLUE SHIELD | Admitting: Family Medicine

## 2017-11-30 ENCOUNTER — Encounter: Payer: Self-pay | Admitting: Family Medicine

## 2017-11-30 VITALS — BP 128/90 | HR 72 | Temp 99.3°F | Wt 233.3 lb

## 2017-11-30 DIAGNOSIS — E1165 Type 2 diabetes mellitus with hyperglycemia: Secondary | ICD-10-CM

## 2017-11-30 DIAGNOSIS — E782 Mixed hyperlipidemia: Secondary | ICD-10-CM | POA: Diagnosis not present

## 2017-11-30 DIAGNOSIS — I129 Hypertensive chronic kidney disease with stage 1 through stage 4 chronic kidney disease, or unspecified chronic kidney disease: Secondary | ICD-10-CM

## 2017-11-30 LAB — BAYER DCA HB A1C WAIVED: HB A1C (BAYER DCA - WAIVED): 8.6 % — ABNORMAL HIGH (ref <7.0)

## 2017-11-30 MED ORDER — LISINOPRIL 30 MG PO TABS: 30.0000 mg | ORAL_TABLET | Freq: Every day | ORAL | 1 refills | Status: DC

## 2017-11-30 MED ORDER — METFORMIN HCL ER 500 MG PO TB24: 1000.0000 mg | ORAL_TABLET | Freq: Two times a day (BID) | ORAL | 1 refills | Status: DC

## 2017-11-30 MED ORDER — EMPAGLIFLOZIN 25 MG PO TABS: 25.0000 mg | ORAL_TABLET | Freq: Every day | ORAL | 3 refills | Status: DC

## 2017-11-30 MED ORDER — ROSUVASTATIN CALCIUM 20 MG PO TABS: ORAL_TABLET | ORAL | 1 refills | Status: DC

## 2017-11-30 MED ORDER — HYDROCHLOROTHIAZIDE 25 MG PO TABS: 25.0000 mg | ORAL_TABLET | Freq: Every day | ORAL | 1 refills | Status: DC

## 2017-11-30 NOTE — Assessment & Plan Note (Addendum)
Not under good control with A1c of 8.6. Will change to metformin 500mg  2 BID given cost and add jardiance. Check tolerance in 1 month and recheck  a1c in 3 months. Call with any concerns.

## 2017-11-30 NOTE — Progress Notes (Signed)
BP 128/90 (BP Location: Left Arm, Cuff Size: Normal)   Pulse 72   Temp 99.3 F (37.4 C)   Wt 233 lb 5 oz (105.8 kg)   SpO2 99%   BMI 31.64 kg/m    Subjective:    Patient ID: Greg Wood, male    DOB: 12/04/1970, 47 y.o.   MRN: [id number]  HPI: Greg Wood is a 47 y.o. male  Chief Complaint  Patient presents with  . Hypertension  . Hyperlipidemia  . Diabetes    Patient's metformin prescription needs to be changed, insurance will not cover Greg Wood returns to the office 3 months later than expected. He states that he is taking his medication   DIABETES Hypoglycemic episodes:no Polydipsia/polyuria: no Visual disturbance: no Chest pain: no Paresthesias: no Glucose Monitoring: no  Accucheck frequency: Not Checking Taking Insulin?: no Blood Pressure Monitoring: not checking Retinal Examination: Not Up to Date Foot Exam: Up to Date Diabetic Education: Not Completed Pneumovax: Up to Date Influenza: Up to Date Aspirin: no  HYPERTENSION / HYPERLIPIDEMIA Satisfied with current treatment? no Duration of hypertension: chronic BP monitoring frequency: not checking BP medication side effects: no Past BP meds: lisinopril-hctz Duration of hyperlipidemia: chronic Cholesterol medication side effects: no Cholesterol supplements: none Past cholesterol medications: crestor Medication compliance: poor compliance Aspirin: no Recent stressors: no Recurrent headaches: no Visual changes: no Palpitations: no Dyspnea: no Chest pain: no Lower extremity edema: no Dizzy/lightheaded: no   Relevant past medical, surgical, family and social history reviewed and updated as indicated. Interim medical history since our last visit reviewed. Allergies and medications reviewed and updated.  Review of Systems  Constitutional: Negative.   Respiratory: Negative.   Cardiovascular: Negative.   Neurological: Negative.   Psychiatric/Behavioral: Negative.     Per HPI unless  specifically indicated above     Objective:    BP 128/90 (BP Location: Left Arm, Cuff Size: Normal)   Pulse 72   Temp 99.3 F (37.4 C)   Wt 233 lb 5 oz (105.8 kg)   SpO2 99%   BMI 31.64 kg/m   Wt Readings from Last 3 Encounters:  11/30/17 233 lb 5 oz (105.8 kg)  10/25/17 238 lb (108 kg)  08/06/17 237 lb 9 oz (107.8 kg)    Physical Exam  Constitutional: He is oriented to person, place, and time. He appears well-developed and well-nourished. No distress.  HENT:  Head: Normocephalic and atraumatic.  Right Ear: Hearing normal.  Left Ear: Hearing normal.  Nose: Nose normal.  Eyes: Conjunctivae and lids are normal. Right eye exhibits no discharge. Left eye exhibits no discharge. No scleral icterus.  Cardiovascular: Normal rate, regular rhythm, normal heart sounds and intact distal pulses. Exam reveals no gallop and no friction rub.  No murmur heard. Pulmonary/Chest: Effort normal and breath sounds normal. No respiratory distress. He has no wheezes. He has no rales. He exhibits no tenderness.  Musculoskeletal: Normal range of motion.  Neurological: He is alert and oriented to person, place, and time.  Skin: Skin is warm, dry and intact. No rash noted. He is not diaphoretic. No erythema. No pallor.  Psychiatric: He has a normal mood and affect. His speech is normal and behavior is normal. Judgment and thought content normal. Cognition and memory are normal.  Nursing note and vitals reviewed.   Results for orders placed or performed in visit on 08/06/17  Microscopic Examination  Result Value Ref Range   WBC, UA 0-5 0 - 5 /hpf  RBC, UA 0-2 0 - 2 /hpf   Epithelial Cells (non renal) CANCELED    Bacteria, UA None seen None seen/Few  CBC with Differential/Platelet  Result Value Ref Range   WBC 5.8 3.4 - 10.8 x10E3/uL   RBC 4.70 4.14 - 5.80 x10E6/uL   Hemoglobin 13.2 13.0 - 17.7 g/dL   Hematocrit 16.139.7 09.637.5 - 51.0 %   MCV 85 79 - 97 fL   MCH 28.1 26.6 - 33.0 pg   MCHC 33.2 31.5 -  35.7 g/dL   RDW 04.512.8 40.912.3 - 81.115.4 %   Platelets 267 150 - 379 x10E3/uL   Neutrophils 46 Not Estab. %   Lymphs 43 Not Estab. %   Monocytes 8 Not Estab. %   Eos 2 Not Estab. %   Basos 1 Not Estab. %   Neutrophils Absolute 2.7 1.4 - 7.0 x10E3/uL   Lymphocytes Absolute 2.5 0.7 - 3.1 x10E3/uL   Monocytes Absolute 0.4 0.1 - 0.9 x10E3/uL   EOS (ABSOLUTE) 0.1 0.0 - 0.4 x10E3/uL   Basophils Absolute 0.1 0.0 - 0.2 x10E3/uL   Immature Granulocytes 0 Not Estab. %   Immature Grans (Abs) 0.0 0.0 - 0.1 x10E3/uL  Bayer DCA Hb A1c Waived  Result Value Ref Range   Bayer DCA Hb A1c Waived 8.1 (H) <7.0 %  Comprehensive metabolic panel  Result Value Ref Range   Glucose 133 (H) 65 - 99 mg/dL   BUN 9 6 - 24 mg/dL   Creatinine, Ser 9.140.85 0.76 - 1.27 mg/dL   GFR calc non Af Amer 103 >59 mL/min/1.73   GFR calc Af Amer 119 >59 mL/min/1.73   BUN/Creatinine Ratio 11 9 - 20   Sodium 142 134 - 144 mmol/L   Potassium 4.9 3.5 - 5.2 mmol/L   Chloride 103 96 - 106 mmol/L   CO2 24 20 - 29 mmol/L   Calcium 9.4 8.7 - 10.2 mg/dL   Total Protein 7.0 6.0 - 8.5 g/dL   Albumin 4.5 3.5 - 5.5 g/dL   Globulin, Total 2.5 1.5 - 4.5 g/dL   Albumin/Globulin Ratio 1.8 1.2 - 2.2   Bilirubin Total 0.3 0.0 - 1.2 mg/dL   Alkaline Phosphatase 55 39 - 117 IU/L   AST 17 0 - 40 IU/L   ALT 15 0 - 44 IU/L  Lipid Panel w/o Chol/HDL Ratio  Result Value Ref Range   Cholesterol, Total 128 100 - 199 mg/dL   Triglycerides 782108 0 - 149 mg/dL   HDL 44 >95>39 mg/dL   VLDL Cholesterol Cal 22 5 - 40 mg/dL   LDL Calculated 62 0 - 99 mg/dL  Microalbumin, Urine Waived  Result Value Ref Range   Microalb, Ur Waived 10 0 - 19 mg/L   Creatinine, Urine Waived 50 10 - 300 mg/dL   Microalb/Creat Ratio 30-300 (H) <30 mg/g  PSA  Result Value Ref Range   Prostate Specific Ag, Serum 0.4 0.0 - 4.0 ng/mL  TSH  Result Value Ref Range   TSH 1.240 0.450 - 4.500 uIU/mL  UA/M w/rflx Culture, Routine  Result Value Ref Range   Specific Gravity, UA 1.010  1.005 - 1.030   pH, UA 7.0 5.0 - 7.5   Color, UA Yellow Yellow   Appearance Ur Clear Clear   Leukocytes, UA Negative Negative   Protein, UA Negative Negative/Trace   Glucose, UA Negative Negative   Ketones, UA Negative Negative   RBC, UA Negative Negative   Bilirubin, UA Negative Negative   Urobilinogen, Ur 0.2 0.2 -  1.0 mg/dL   Nitrite, UA Negative Negative   Microscopic Examination See below:       Assessment & Plan:   Problem List Items Addressed This Visit      Endocrine   Diabetes mellitus with hyperglycemia (HCC) - Primary    Not under good control with A1c of 8.6. Will change to metformin 500mg  2 BID given cost and add jardiance. Check tolerance in 1 month and recheck  a1c in 3 months. Call with any concerns.        Relevant Medications   metFORMIN (GLUCOPHAGE-XR) 500 MG 24 hr tablet   rosuvastatin (CRESTOR) 20 MG tablet   lisinopril (PRINIVIL,ZESTRIL) 30 MG tablet   empagliflozin (JARDIANCE) 25 MG TABS tablet   Other Relevant Orders   Bayer DCA Hb A1c Waived   Comprehensive metabolic panel     Genitourinary   Benign hypertensive renal disease    Not under good control, but better on recheck. Will increase to 30mg  lisinopril and continue 25mg  HCTZ. Recheck 1 month and adjust from there.       Relevant Orders   Comprehensive metabolic panel     Other   Hyperlipidemia    Checking levels today. Await results. Call with any concerns.       Relevant Medications   rosuvastatin (CRESTOR) 20 MG tablet   lisinopril (PRINIVIL,ZESTRIL) 30 MG tablet   hydrochlorothiazide (HYDRODIURIL) 25 MG tablet   Other Relevant Orders   Comprehensive metabolic panel   Lipid Panel w/o Chol/HDL Ratio       Follow up plan: Return in about 4 weeks (around 12/28/2017) for Follow up BP and jardiance tolerance.

## 2017-11-30 NOTE — Assessment & Plan Note (Addendum)
Checking levels today. Await results. Call with any concerns.  

## 2017-11-30 NOTE — Assessment & Plan Note (Addendum)
Not under good control, but better on recheck. Will increase to 30mg  lisinopril and continue 25mg  HCTZ. Recheck 1 month and adjust from there.

## 2017-12-01 LAB — COMPREHENSIVE METABOLIC PANEL
A/G RATIO: 1.8 (ref 1.2–2.2)
ALBUMIN: 4.9 g/dL (ref 3.5–5.5)
ALK PHOS: 59 IU/L (ref 39–117)
ALT: 21 IU/L (ref 0–44)
AST: 16 IU/L (ref 0–40)
BUN / CREAT RATIO: 11 (ref 9–20)
BUN: 10 mg/dL (ref 6–24)
Bilirubin Total: 0.5 mg/dL (ref 0.0–1.2)
CALCIUM: 10.2 mg/dL (ref 8.7–10.2)
CO2: 24 mmol/L (ref 20–29)
Chloride: 100 mmol/L (ref 96–106)
Creatinine, Ser: 0.89 mg/dL (ref 0.76–1.27)
GFR calc Af Amer: 117 mL/min/{1.73_m2} (ref >59)
GFR, EST NON AFRICAN AMERICAN: 101 mL/min/{1.73_m2} (ref >59)
GLOBULIN, TOTAL: 2.7 g/dL (ref 1.5–4.5)
Glucose: 146 mg/dL — ABNORMAL HIGH (ref 65–99)
POTASSIUM: 4.1 mmol/L (ref 3.5–5.2)
SODIUM: 139 mmol/L (ref 134–144)
Total Protein: 7.6 g/dL (ref 6.0–8.5)

## 2017-12-01 LAB — LIPID PANEL W/O CHOL/HDL RATIO
Cholesterol, Total: 168 mg/dL (ref 100–199)
HDL: 38 mg/dL — ABNORMAL LOW (ref >39)
LDL Calculated: 90 mg/dL (ref 0–99)
Triglycerides: 202 mg/dL — ABNORMAL HIGH (ref 0–149)
VLDL Cholesterol Cal: 40 mg/dL (ref 5–40)

## 2017-12-29 ENCOUNTER — Ambulatory Visit: Payer: BLUE CROSS/BLUE SHIELD | Admitting: Family Medicine

## 2018-02-08 ENCOUNTER — Other Ambulatory Visit: Payer: Self-pay | Admitting: Family Medicine

## 2018-02-19 DIAGNOSIS — Z008 Encounter for other general examination: Secondary | ICD-10-CM | POA: Diagnosis not present

## 2018-02-19 DIAGNOSIS — I10 Essential (primary) hypertension: Secondary | ICD-10-CM | POA: Diagnosis not present

## 2018-02-19 DIAGNOSIS — Z719 Counseling, unspecified: Secondary | ICD-10-CM | POA: Diagnosis not present

## 2018-02-19 DIAGNOSIS — E119 Type 2 diabetes mellitus without complications: Secondary | ICD-10-CM | POA: Diagnosis not present

## 2018-03-08 ENCOUNTER — Other Ambulatory Visit: Payer: Self-pay | Admitting: Family Medicine

## 2018-04-15 ENCOUNTER — Other Ambulatory Visit: Payer: Self-pay | Admitting: Family Medicine

## 2018-05-06 DIAGNOSIS — Z719 Counseling, unspecified: Secondary | ICD-10-CM | POA: Diagnosis not present

## 2018-05-06 DIAGNOSIS — Z008 Encounter for other general examination: Secondary | ICD-10-CM | POA: Diagnosis not present

## 2018-05-06 DIAGNOSIS — E119 Type 2 diabetes mellitus without complications: Secondary | ICD-10-CM | POA: Diagnosis not present

## 2018-05-06 DIAGNOSIS — I10 Essential (primary) hypertension: Secondary | ICD-10-CM | POA: Diagnosis not present

## 2018-05-16 ENCOUNTER — Other Ambulatory Visit: Payer: Self-pay | Admitting: Family Medicine

## 2018-05-18 NOTE — Telephone Encounter (Signed)
Jardiance refill Last Refill:04/16/18 # 30 Last OV: 11/30/17 PCP: Olevia PerchesMegan Johnson DO Pharmacy:[facility] [address] Called pt and left VM to call and make an appt to f/u diabetes. Informed pt that refill will be for 1 month.

## 2018-06-02 ENCOUNTER — Other Ambulatory Visit: Payer: Self-pay | Admitting: Orthopedic Surgery

## 2018-06-02 DIAGNOSIS — S83241A Other tear of medial meniscus, current injury, right knee, initial encounter: Secondary | ICD-10-CM | POA: Diagnosis not present

## 2018-06-02 DIAGNOSIS — M25561 Pain in right knee: Secondary | ICD-10-CM

## 2018-06-12 ENCOUNTER — Other Ambulatory Visit: Payer: Self-pay | Admitting: Family Medicine

## 2018-06-13 ENCOUNTER — Other Ambulatory Visit: Payer: Self-pay | Admitting: Family Medicine

## 2018-06-14 NOTE — Telephone Encounter (Signed)
Needs an appointment.

## 2018-06-16 ENCOUNTER — Telehealth: Payer: Self-pay | Admitting: Family Medicine

## 2018-06-17 ENCOUNTER — Ambulatory Visit
Admission: RE | Admit: 2018-06-17 | Discharge: 2018-06-17 | Disposition: A | Discharge status: Home / Self Care | Payer: BLUE CROSS/BLUE SHIELD | Source: Ambulatory Visit | Attending: Orthopedic Surgery | Admitting: Orthopedic Surgery

## 2018-06-17 DIAGNOSIS — M25561 Pain in right knee: Secondary | ICD-10-CM

## 2018-06-17 DIAGNOSIS — S83231A Complex tear of medial meniscus, current injury, right knee, initial encounter: Secondary | ICD-10-CM | POA: Diagnosis not present

## 2018-06-17 DIAGNOSIS — M23321 Other meniscus derangements, posterior horn of medial meniscus, right knee: Secondary | ICD-10-CM | POA: Diagnosis not present

## 2018-06-17 DIAGNOSIS — M25461 Effusion, right knee: Secondary | ICD-10-CM | POA: Diagnosis not present

## 2018-06-17 DIAGNOSIS — M7121 Synovial cyst of popliteal space [Baker], right knee: Secondary | ICD-10-CM | POA: Insufficient documentation

## 2018-06-17 DIAGNOSIS — M23351 Other meniscus derangements, posterior horn of lateral meniscus, right knee: Secondary | ICD-10-CM | POA: Insufficient documentation

## 2018-06-17 NOTE — Telephone Encounter (Signed)
Spoke with pt and he stated that he would call back tomorrow to schedule an appt.

## 2018-06-17 NOTE — Telephone Encounter (Signed)
Needs appointment

## 2018-06-18 MED ORDER — METFORMIN HCL ER 500 MG PO TB24: 1000.0000 mg | ORAL_TABLET | Freq: Two times a day (BID) | ORAL | 1 refills | Status: DC

## 2018-06-18 MED ORDER — EMPAGLIFLOZIN 25 MG PO TABS: 25.0000 mg | ORAL_TABLET | Freq: Every day | ORAL | 1 refills | Status: DC

## 2018-06-18 MED ORDER — ROSUVASTATIN CALCIUM 20 MG PO TABS: ORAL_TABLET | ORAL | 1 refills | Status: DC

## 2018-06-18 MED ORDER — HYDROCHLOROTHIAZIDE 25 MG PO TABS: 25.0000 mg | ORAL_TABLET | Freq: Every day | ORAL | 1 refills | Status: DC

## 2018-06-18 MED ORDER — LISINOPRIL 30 MG PO TABS: 30.0000 mg | ORAL_TABLET | Freq: Every day | ORAL | 1 refills | Status: DC

## 2018-06-18 NOTE — Telephone Encounter (Signed)
Patient called, left VM to return call to the office to schedule an appointment before medication refilled.

## 2018-06-18 NOTE — Telephone Encounter (Signed)
Patient scheduled for medication refill visit for 07/29/18 at 4:00pm. States that he is completely out of medication. [id number] - [place], [place] - [address] [facility] [address] [facility]

## 2018-06-18 NOTE — Addendum Note (Signed)
Addended by: Dorcas CarrowJOHNSON, MEGAN P on: 06/18/2018 04:01 PM   Modules accepted: Orders

## 2018-06-22 NOTE — Telephone Encounter (Signed)
Routing to close. Duplicate encounter.

## 2018-06-23 DIAGNOSIS — S83241D Other tear of medial meniscus, current injury, right knee, subsequent encounter: Secondary | ICD-10-CM | POA: Diagnosis not present

## 2018-07-07 DIAGNOSIS — S83241D Other tear of medial meniscus, current injury, right knee, subsequent encounter: Secondary | ICD-10-CM | POA: Diagnosis not present

## 2018-07-09 ENCOUNTER — Encounter (HOSPITAL_BASED_OUTPATIENT_CLINIC_OR_DEPARTMENT_OTHER): Payer: Self-pay | Admitting: Physician Assistant

## 2018-07-09 DIAGNOSIS — S82131A Displaced fracture of medial condyle of right tibia, initial encounter for closed fracture: Secondary | ICD-10-CM | POA: Diagnosis present

## 2018-07-09 DIAGNOSIS — S83241A Other tear of medial meniscus, current injury, right knee, initial encounter: Secondary | ICD-10-CM | POA: Diagnosis present

## 2018-07-09 DIAGNOSIS — S83281A Other tear of lateral meniscus, current injury, right knee, initial encounter: Secondary | ICD-10-CM | POA: Diagnosis present

## 2018-07-09 NOTE — H&P (Signed)
Greg Wood is an 47 y.o. male.   Chief Complaint: right knee medial mensicus tear and tibial plateau subchondral fracture HPI: [name] is a 47 year-old gentleman seen for evaluation for persistent right knee pain with two injuries.  One with a fall at home and then he stepped into a hole while cutting his grass.  He had undergone an MRI that revealed medial and lateral meniscal tears with chondromalacia and significant subchondral stress fracture/edema.  He continues to have significant pain in the medial tibial plateau and the medial joint line and laterally as well.  He was found to have minimal degenerative changes.    Past Medical History:  Diagnosis Date  . Acute bronchitis   . Allergic rhinitis due to pollen   . Blood in the urine   . Diabetes mellitus without complication (HCC)   . Hypercalcemia    Resolved on recheck  . Hyperlipidemia   . Hypertension     No past surgical history on file.  Family History  Problem Relation Age of Onset  . Diabetes Mother   . Diabetes Maternal Grandmother    Social History:  reports that he has never smoked. He has never used smokeless tobacco. He reports that he does not drink alcohol or use drugs.  Allergies: No Known Allergies  No medications prior to admission.    No results found for this or any previous visit (from the past 48 hour(s)). No results found.  Review of Systems  Constitutional: Negative.   HENT: Negative.   Eyes: Negative.   Respiratory: Negative.   Cardiovascular: Negative.   Gastrointestinal: Negative.   Genitourinary: Negative.   Musculoskeletal: Positive for joint pain.  Skin: Negative.   Neurological: Negative.   Endo/Heme/Allergies: Negative.   Psychiatric/Behavioral: Negative.     There were no vitals taken for this visit. Physical Exam  Constitutional: He is oriented to person, place, and time. He appears well-developed and well-nourished.  HENT:  Head: Normocephalic and atraumatic.  Eyes:  Pupils are equal, round, and reactive to light. Conjunctivae and EOM are normal.  Neck: Neck supple.  Cardiovascular: Normal rate.  Respiratory: Effort normal.  GI: Soft.  Musculoskeletal:  Examination of his right knee reveals pain medially.  Positive medial McMurray.  1+ effusion.  Full range of motion.  Knee is stable with normal patella tracking.  Examination of the left knee reveals full range of motion without pain, swelling, weakness or instability.  Vascular exam: Pulses are 2+ and symmetric.    Neurological: He is alert and oriented to person, place, and time.  Skin: Skin is warm and dry.  Psychiatric: He has a normal mood and affect. His behavior is normal.     Assessment Principal Problem:   Acute medial meniscus tear of right knee Active Problems:   Allergic rhinitis due to pollen   Diabetes mellitus with hyperglycemia (HCC)   Hyperlipidemia   Benign hypertensive renal disease   Acute lateral meniscus tear of right knee   Right medial tibial plateau fracture, closed, initial encounter   Plan At this point I have talked to him and his wife about this in detail.  Would recommend with these findings that we proceed with right knee arthroscopy with meniscectomy and chondroplasty, as well as medial tibial plateau subchondroplasty.  Risks, complications and benefits of the surgery have been described to them in detail and they understand this completely.  We will plan on setting him up for this at some point in the near future.  We will have Dr. Jodi GeraldsJohn Wood assist with this procedure.    Greg Wood J Greg Anfinson, PA-C 07/09/2018, 8:59 PM

## 2018-07-22 DIAGNOSIS — E119 Type 2 diabetes mellitus without complications: Secondary | ICD-10-CM | POA: Diagnosis not present

## 2018-07-22 DIAGNOSIS — I10 Essential (primary) hypertension: Secondary | ICD-10-CM | POA: Diagnosis not present

## 2018-07-22 DIAGNOSIS — Z008 Encounter for other general examination: Secondary | ICD-10-CM | POA: Diagnosis not present

## 2018-07-22 DIAGNOSIS — Z719 Counseling, unspecified: Secondary | ICD-10-CM | POA: Diagnosis not present

## 2018-07-29 ENCOUNTER — Ambulatory Visit: Payer: BLUE CROSS/BLUE SHIELD | Admitting: Family Medicine

## 2018-07-29 ENCOUNTER — Encounter (HOSPITAL_BASED_OUTPATIENT_CLINIC_OR_DEPARTMENT_OTHER): Payer: Self-pay

## 2018-07-29 ENCOUNTER — Other Ambulatory Visit: Payer: Self-pay

## 2018-08-02 ENCOUNTER — Other Ambulatory Visit: Payer: Self-pay

## 2018-08-02 ENCOUNTER — Encounter (HOSPITAL_BASED_OUTPATIENT_CLINIC_OR_DEPARTMENT_OTHER)
Admission: RE | Admit: 2018-08-02 | Discharge: 2018-08-02 | Disposition: A | Discharge status: Home / Self Care | Payer: BLUE CROSS/BLUE SHIELD | Source: Ambulatory Visit | Attending: Orthopedic Surgery | Admitting: Orthopedic Surgery

## 2018-08-02 DIAGNOSIS — Z7984 Long term (current) use of oral hypoglycemic drugs: Secondary | ICD-10-CM | POA: Diagnosis not present

## 2018-08-02 DIAGNOSIS — I10 Essential (primary) hypertension: Secondary | ICD-10-CM | POA: Insufficient documentation

## 2018-08-02 DIAGNOSIS — S83241A Other tear of medial meniscus, current injury, right knee, initial encounter: Secondary | ICD-10-CM | POA: Insufficient documentation

## 2018-08-02 DIAGNOSIS — M94261 Chondromalacia, right knee: Secondary | ICD-10-CM | POA: Diagnosis not present

## 2018-08-02 DIAGNOSIS — Z01818 Encounter for other preprocedural examination: Secondary | ICD-10-CM | POA: Diagnosis not present

## 2018-08-02 DIAGNOSIS — W19XXXA Unspecified fall, initial encounter: Secondary | ICD-10-CM | POA: Diagnosis not present

## 2018-08-02 DIAGNOSIS — M84361A Stress fracture, right tibia, initial encounter for fracture: Secondary | ICD-10-CM | POA: Diagnosis not present

## 2018-08-02 DIAGNOSIS — S83281A Other tear of lateral meniscus, current injury, right knee, initial encounter: Secondary | ICD-10-CM | POA: Diagnosis not present

## 2018-08-02 DIAGNOSIS — X58XXXA Exposure to other specified factors, initial encounter: Secondary | ICD-10-CM | POA: Diagnosis not present

## 2018-08-02 DIAGNOSIS — Z79899 Other long term (current) drug therapy: Secondary | ICD-10-CM | POA: Diagnosis not present

## 2018-08-02 DIAGNOSIS — E785 Hyperlipidemia, unspecified: Secondary | ICD-10-CM | POA: Diagnosis not present

## 2018-08-02 DIAGNOSIS — R9431 Abnormal electrocardiogram [ECG] [EKG]: Secondary | ICD-10-CM | POA: Insufficient documentation

## 2018-08-02 DIAGNOSIS — S82131A Displaced fracture of medial condyle of right tibia, initial encounter for closed fracture: Secondary | ICD-10-CM | POA: Insufficient documentation

## 2018-08-02 DIAGNOSIS — E119 Type 2 diabetes mellitus without complications: Secondary | ICD-10-CM | POA: Insufficient documentation

## 2018-08-02 LAB — BASIC METABOLIC PANEL
ANION GAP: 11 (ref 5–15)
BUN: 14 mg/dL (ref 6–20)
CHLORIDE: 104 mmol/L (ref 98–111)
CO2: 25 mmol/L (ref 22–32)
Calcium: 9.5 mg/dL (ref 8.9–10.3)
Creatinine, Ser: 0.89 mg/dL (ref 0.61–1.24)
GFR calc Af Amer: >60 mL/min (ref >60)
Glucose, Bld: 115 mg/dL — ABNORMAL HIGH (ref 70–99)
POTASSIUM: 4.4 mmol/L (ref 3.5–5.1)
SODIUM: 140 mmol/L (ref 135–145)

## 2018-08-02 NOTE — Progress Notes (Signed)
Patient in PAT for lab work and EKG. EKG seen by Dr. Nevada Crane and surgery will proceed as scheduled.

## 2018-08-04 ENCOUNTER — Encounter (HOSPITAL_BASED_OUTPATIENT_CLINIC_OR_DEPARTMENT_OTHER): Payer: Self-pay | Admitting: Certified Registered"

## 2018-08-04 ENCOUNTER — Ambulatory Visit (HOSPITAL_BASED_OUTPATIENT_CLINIC_OR_DEPARTMENT_OTHER)
Admission: RE | Admit: 2018-08-04 | Discharge: 2018-08-04 | Disposition: A | Discharge status: Home / Self Care | Payer: BLUE CROSS/BLUE SHIELD | Source: Ambulatory Visit | Attending: Orthopedic Surgery | Admitting: Orthopedic Surgery

## 2018-08-04 ENCOUNTER — Ambulatory Visit (HOSPITAL_BASED_OUTPATIENT_CLINIC_OR_DEPARTMENT_OTHER): Payer: BLUE CROSS/BLUE SHIELD | Admitting: Anesthesiology

## 2018-08-04 ENCOUNTER — Encounter (HOSPITAL_BASED_OUTPATIENT_CLINIC_OR_DEPARTMENT_OTHER)
Admission: RE | Disposition: A | Discharge status: Home / Self Care | Payer: Self-pay | Source: Ambulatory Visit | Attending: Orthopedic Surgery

## 2018-08-04 ENCOUNTER — Other Ambulatory Visit: Payer: Self-pay

## 2018-08-04 ENCOUNTER — Ambulatory Visit (HOSPITAL_COMMUNITY): Payer: BLUE CROSS/BLUE SHIELD

## 2018-08-04 DIAGNOSIS — I10 Essential (primary) hypertension: Secondary | ICD-10-CM | POA: Insufficient documentation

## 2018-08-04 DIAGNOSIS — J301 Allergic rhinitis due to pollen: Secondary | ICD-10-CM | POA: Diagnosis present

## 2018-08-04 DIAGNOSIS — Z7984 Long term (current) use of oral hypoglycemic drugs: Secondary | ICD-10-CM | POA: Diagnosis not present

## 2018-08-04 DIAGNOSIS — E785 Hyperlipidemia, unspecified: Secondary | ICD-10-CM | POA: Diagnosis not present

## 2018-08-04 DIAGNOSIS — S83241A Other tear of medial meniscus, current injury, right knee, initial encounter: Secondary | ICD-10-CM

## 2018-08-04 DIAGNOSIS — S82131A Displaced fracture of medial condyle of right tibia, initial encounter for closed fracture: Secondary | ICD-10-CM | POA: Diagnosis present

## 2018-08-04 DIAGNOSIS — S82141A Displaced bicondylar fracture of right tibia, initial encounter for closed fracture: Secondary | ICD-10-CM | POA: Diagnosis not present

## 2018-08-04 DIAGNOSIS — S82134A Nondisplaced fracture of medial condyle of right tibia, initial encounter for closed fracture: Secondary | ICD-10-CM | POA: Diagnosis not present

## 2018-08-04 DIAGNOSIS — G8918 Other acute postprocedural pain: Secondary | ICD-10-CM | POA: Diagnosis not present

## 2018-08-04 DIAGNOSIS — M84361A Stress fracture, right tibia, initial encounter for fracture: Secondary | ICD-10-CM | POA: Insufficient documentation

## 2018-08-04 DIAGNOSIS — Z96651 Presence of right artificial knee joint: Secondary | ICD-10-CM | POA: Diagnosis not present

## 2018-08-04 DIAGNOSIS — M94261 Chondromalacia, right knee: Secondary | ICD-10-CM | POA: Insufficient documentation

## 2018-08-04 DIAGNOSIS — Z79899 Other long term (current) drug therapy: Secondary | ICD-10-CM | POA: Diagnosis not present

## 2018-08-04 DIAGNOSIS — I129 Hypertensive chronic kidney disease with stage 1 through stage 4 chronic kidney disease, or unspecified chronic kidney disease: Secondary | ICD-10-CM | POA: Diagnosis present

## 2018-08-04 DIAGNOSIS — Z471 Aftercare following joint replacement surgery: Secondary | ICD-10-CM | POA: Diagnosis not present

## 2018-08-04 DIAGNOSIS — Z419 Encounter for procedure for purposes other than remedying health state, unspecified: Secondary | ICD-10-CM

## 2018-08-04 DIAGNOSIS — E1165 Type 2 diabetes mellitus with hyperglycemia: Secondary | ICD-10-CM | POA: Diagnosis present

## 2018-08-04 DIAGNOSIS — E119 Type 2 diabetes mellitus without complications: Secondary | ICD-10-CM | POA: Diagnosis not present

## 2018-08-04 DIAGNOSIS — W19XXXA Unspecified fall, initial encounter: Secondary | ICD-10-CM | POA: Insufficient documentation

## 2018-08-04 DIAGNOSIS — S83281A Other tear of lateral meniscus, current injury, right knee, initial encounter: Secondary | ICD-10-CM | POA: Diagnosis present

## 2018-08-04 HISTORY — DX: Other tear of lateral meniscus, current injury, right knee, initial encounter: S83.281A

## 2018-08-04 HISTORY — PX: KNEE ARTHROSCOPY WITH SUBCHONDROPLASTY: SHX6732

## 2018-08-04 HISTORY — PX: CHONDROPLASTY: SHX5177

## 2018-08-04 HISTORY — DX: Other tear of medial meniscus, current injury, right knee, initial encounter: S83.241A

## 2018-08-04 HISTORY — PX: KNEE ARTHROSCOPY WITH MEDIAL MENISECTOMY: SHX5651

## 2018-08-04 HISTORY — PX: KNEE ARTHROSCOPY WITH LATERAL MENISECTOMY: SHX6193

## 2018-08-04 HISTORY — DX: Displaced fracture of medial condyle of right tibia, initial encounter for closed fracture: S82.131A

## 2018-08-04 LAB — GLUCOSE, CAPILLARY
GLUCOSE-CAPILLARY: 123 mg/dL — AB (ref 70–99)
Glucose-Capillary: 121 mg/dL — ABNORMAL HIGH (ref 70–99)

## 2018-08-04 SURGERY — ARTHROSCOPY, KNEE, WITH MEDIAL MENISCECTOMY
Anesthesia: General | Site: Knee | Laterality: Right

## 2018-08-04 MED ORDER — BUPIVACAINE-EPINEPHRINE (PF) 0.25% -1:200000 IJ SOLN
INTRAMUSCULAR | Status: AC
  Filled 2018-08-04: qty 60

## 2018-08-04 MED ORDER — ONDANSETRON HCL 4 MG/2ML IJ SOLN
INTRAMUSCULAR | Status: DC | PRN
  Administered 2018-08-04: 4 mg via INTRAVENOUS

## 2018-08-04 MED ORDER — CEFAZOLIN SODIUM-DEXTROSE 2-4 GM/100ML-% IV SOLN
INTRAVENOUS | Status: AC
  Filled 2018-08-04: qty 100

## 2018-08-04 MED ORDER — ROPIVACAINE HCL 7.5 MG/ML IJ SOLN
INTRAMUSCULAR | Status: DC | PRN
  Administered 2018-08-04: 20 mL via PERINEURAL

## 2018-08-04 MED ORDER — MEPERIDINE HCL 25 MG/ML IJ SOLN: 6.2500 mg | INTRAMUSCULAR | Status: DC | PRN

## 2018-08-04 MED ORDER — SCOPOLAMINE 1 MG/3DAYS TD PT72: 1.0000 | MEDICATED_PATCH | Freq: Once | TRANSDERMAL | Status: DC | PRN

## 2018-08-04 MED ORDER — MIDAZOLAM HCL 2 MG/2ML IJ SOLN
1.0000 mg | INTRAMUSCULAR | Status: DC | PRN
  Administered 2018-08-04: 1 mg via INTRAVENOUS

## 2018-08-04 MED ORDER — LACTATED RINGERS IV SOLN
INTRAVENOUS | Status: DC
  Administered 2018-08-04 (×2): via INTRAVENOUS

## 2018-08-04 MED ORDER — OXYCODONE HCL 5 MG/5ML PO SOLN: 5.0000 mg | Freq: Once | ORAL | Status: DC | PRN

## 2018-08-04 MED ORDER — FENTANYL CITRATE (PF) 100 MCG/2ML IJ SOLN
50.0000 ug | INTRAMUSCULAR | Status: DC | PRN
  Administered 2018-08-04: 100 ug via INTRAVENOUS
  Administered 2018-08-04: 50 ug via INTRAVENOUS

## 2018-08-04 MED ORDER — CYCLOBENZAPRINE HCL 5 MG PO TABS: 5.0000 mg | ORAL_TABLET | Freq: Three times a day (TID) | ORAL | 0 refills | Status: DC | PRN

## 2018-08-04 MED ORDER — EPINEPHRINE 30 MG/30ML IJ SOLN
INTRAMUSCULAR | Status: AC
  Filled 2018-08-04: qty 1

## 2018-08-04 MED ORDER — SODIUM CHLORIDE 0.9 % IR SOLN
Status: DC | PRN
  Administered 2018-08-04: 5000 mL

## 2018-08-04 MED ORDER — MIDAZOLAM HCL 2 MG/2ML IJ SOLN
INTRAMUSCULAR | Status: AC
  Filled 2018-08-04: qty 2

## 2018-08-04 MED ORDER — CLONIDINE HCL (ANALGESIA) 100 MCG/ML EP SOLN
EPIDURAL | Status: DC | PRN
  Administered 2018-08-04: 50 ug

## 2018-08-04 MED ORDER — BUPIVACAINE HCL 0.5 % IJ SOLN
INTRAMUSCULAR | Status: DC | PRN
  Administered 2018-08-04: 15 mL

## 2018-08-04 MED ORDER — DEXAMETHASONE SODIUM PHOSPHATE 10 MG/ML IJ SOLN
INTRAMUSCULAR | Status: DC | PRN
  Administered 2018-08-04: 4 mg via INTRAVENOUS

## 2018-08-04 MED ORDER — POVIDONE-IODINE 7.5 % EX SOLN: Freq: Once | CUTANEOUS | Status: DC

## 2018-08-04 MED ORDER — BUPIVACAINE-EPINEPHRINE 0.25% -1:200000 IJ SOLN
INTRAMUSCULAR | Status: DC | PRN
  Administered 2018-08-04: 20 mL

## 2018-08-04 MED ORDER — CHLORHEXIDINE GLUCONATE 4 % EX LIQD: 60.0000 mL | Freq: Once | CUTANEOUS | Status: DC

## 2018-08-04 MED ORDER — PROPOFOL 10 MG/ML IV BOLUS
INTRAVENOUS | Status: DC | PRN
  Administered 2018-08-04: 250 mg via INTRAVENOUS

## 2018-08-04 MED ORDER — FENTANYL CITRATE (PF) 100 MCG/2ML IJ SOLN
INTRAMUSCULAR | Status: AC
  Filled 2018-08-04: qty 2

## 2018-08-04 MED ORDER — BUPIVACAINE HCL (PF) 0.5 % IJ SOLN
INTRAMUSCULAR | Status: AC
  Filled 2018-08-04: qty 30

## 2018-08-04 MED ORDER — LIDOCAINE HCL (CARDIAC) PF 100 MG/5ML IV SOSY
PREFILLED_SYRINGE | INTRAVENOUS | Status: DC | PRN
  Administered 2018-08-04: 30 mg via INTRAVENOUS

## 2018-08-04 MED ORDER — OXYCODONE HCL 5 MG PO TABS: 5.0000 mg | ORAL_TABLET | ORAL | 0 refills | Status: DC | PRN

## 2018-08-04 MED ORDER — FENTANYL CITRATE (PF) 100 MCG/2ML IJ SOLN
25.0000 ug | INTRAMUSCULAR | Status: DC | PRN
  Administered 2018-08-04: 50 ug via INTRAVENOUS

## 2018-08-04 MED ORDER — PROMETHAZINE HCL 25 MG/ML IJ SOLN: 6.2500 mg | INTRAMUSCULAR | Status: DC | PRN

## 2018-08-04 MED ORDER — LACTATED RINGERS IV SOLN: INTRAVENOUS | Status: DC

## 2018-08-04 MED ORDER — OXYCODONE HCL 5 MG PO TABS: 5.0000 mg | ORAL_TABLET | Freq: Once | ORAL | Status: DC | PRN

## 2018-08-04 MED ORDER — CEFAZOLIN SODIUM-DEXTROSE 2-4 GM/100ML-% IV SOLN
2.0000 g | INTRAVENOUS | Status: AC
  Administered 2018-08-04: 2 g via INTRAVENOUS

## 2018-08-04 SURGICAL SUPPLY — 54 items
BANDAGE ACE 6X5 VEL STRL LF (GAUZE/BANDAGES/DRESSINGS) ×2 IMPLANT
BLADE CUDA GRT WHITE 3.5 (BLADE) IMPLANT
BLADE CUTTER GATOR 3.5 (BLADE) IMPLANT
BLADE GREAT WHITE 4.2 (BLADE) IMPLANT
BLADE SURG 15 STRL LF DISP TIS (BLADE) IMPLANT
BLADE SURG 15 STRL SS (BLADE)
BNDG COHESIVE 4X5 TAN STRL (GAUZE/BANDAGES/DRESSINGS) ×2 IMPLANT
DRAPE ARTHROSCOPY W/POUCH 90 (DRAPES) ×2 IMPLANT
DRAPE C-ARMOR (DRAPES) ×2 IMPLANT
DRAPE IMP U-DRAPE 54X76 (DRAPES) ×2 IMPLANT
DRAPE OEC MINIVIEW 54X84 (DRAPES) IMPLANT
DRAPE U-SHAPE 47X51 STRL (DRAPES) ×2 IMPLANT
DURAPREP 26ML APPLICATOR (WOUND CARE) ×2 IMPLANT
GAUZE SPONGE 4X4 12PLY STRL (GAUZE/BANDAGES/DRESSINGS) ×2 IMPLANT
GAUZE XEROFORM 1X8 LF (GAUZE/BANDAGES/DRESSINGS) ×2 IMPLANT
GLOVE BIO SURGEON STRL SZ7 (GLOVE) ×4 IMPLANT
GLOVE BIOGEL PI IND STRL 7.0 (GLOVE) ×4 IMPLANT
GLOVE BIOGEL PI IND STRL 7.5 (GLOVE) ×2 IMPLANT
GLOVE BIOGEL PI IND STRL 8 (GLOVE) ×1 IMPLANT
GLOVE BIOGEL PI INDICATOR 7.0 (GLOVE) ×4
GLOVE BIOGEL PI INDICATOR 7.5 (GLOVE) ×2
GLOVE BIOGEL PI INDICATOR 8 (GLOVE) ×1
GLOVE ECLIPSE 6.5 STRL STRAW (GLOVE) ×2 IMPLANT
GLOVE ECLIPSE 7.5 STRL STRAW (GLOVE) ×2 IMPLANT
GLOVE SS BIOGEL STRL SZ 7.5 (GLOVE) ×2 IMPLANT
GLOVE SUPERSENSE BIOGEL SZ 7.5 (GLOVE) ×2
GOWN STRL REUS W/ TWL LRG LVL3 (GOWN DISPOSABLE) ×3 IMPLANT
GOWN STRL REUS W/ TWL XL LVL3 (GOWN DISPOSABLE) ×2 IMPLANT
GOWN STRL REUS W/TWL LRG LVL3 (GOWN DISPOSABLE) ×3
GOWN STRL REUS W/TWL XL LVL3 (GOWN DISPOSABLE) ×4 IMPLANT
HOLDER KNEE FOAM BLUE (MISCELLANEOUS) ×2 IMPLANT
IV NS IRRIG 3000ML ARTHROMATIC (IV SOLUTION) ×4 IMPLANT
KIT KNEE SUBCHONDROPLASTY (Joint) ×2 IMPLANT
KIT MIXER ACCUMIX (KITS) ×2 IMPLANT
KNEE WRAP E Z 3 GEL PACK (MISCELLANEOUS) ×2 IMPLANT
MANIFOLD NEPTUNE II (INSTRUMENTS) IMPLANT
NDL SAFETY ECLIPSE 18X1.5 (NEEDLE) ×2 IMPLANT
NEEDLE HYPO 18GX1.5 SHARP (NEEDLE) ×2
NEEDLE HYPO 22GX1.5 SAFETY (NEEDLE) IMPLANT
PACK ARTHROSCOPY DSU (CUSTOM PROCEDURE TRAY) ×2 IMPLANT
PACK BASIN DAY SURGERY FS (CUSTOM PROCEDURE TRAY) ×2 IMPLANT
PAD ALCOHOL SWAB (MISCELLANEOUS) ×8 IMPLANT
SUCTION FRAZIER HANDLE 10FR (MISCELLANEOUS)
SUCTION TUBE FRAZIER 10FR DISP (MISCELLANEOUS) IMPLANT
SUT ETHILON 4 0 PS 2 18 (SUTURE) ×2 IMPLANT
SUT PROLENE 3 0 PS 2 (SUTURE) IMPLANT
SUT VIC AB 3-0 PS1 18 (SUTURE)
SUT VIC AB 3-0 PS1 18XBRD (SUTURE) IMPLANT
SYR 20CC LL (SYRINGE) ×2 IMPLANT
SYR 5ML LL (SYRINGE) ×2 IMPLANT
TOWEL GREEN STERILE FF (TOWEL DISPOSABLE) ×2 IMPLANT
TUBING ARTHRO INFLOW-ONLY STRL (TUBING) ×2 IMPLANT
WAND STAR VAC 90 (SURGICAL WAND) IMPLANT
WATER STERILE IRR 1000ML POUR (IV SOLUTION) IMPLANT

## 2018-08-04 NOTE — Op Note (Signed)
NAME: Greg Wood, BIALAS MEDICAL RECORD NG:[id number] ACCOUNT [id number] DATE OF BIRTH:1971/06/09 FACILITY: [facility] LOCATION: [facility] PHYSICIAN:Adryan Druckenmiller Salley Slaughter, MD  OPERATIVE REPORT  DATE OF PROCEDURE:  08/04/2018  PREOPERATIVE DIAGNOSES: 1.  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 2.  Right knee chondromalacia. 3.  Right knee chronic traumatic medial tibial plateau stress fracture.  POSTOPERATIVE DIAGNOSES: 1.  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  Right knee chondromalacia. 3.  Right knee chronic traumatic medial tibial plateau stress fracture.  PROCEDURE PERFORMED: 1.  Right knee EUA followed by arthroscopic partial medial and lateral meniscectomies with chondroplasty. 2.  Right knee medial tibial plateau subchondroplasty internal fixation of the medial tibial plateau fracture using Zimmer subchondroplasty bone cement.  SURGEON:  Salvatore Marvel, MD  ASSISTANT:  Jodi Geralds, MD, and Julien Girt, [place]   ANESTHESIA:  General.  OPERATIVE TIME:  1 hour.  COMPLICATIONS:  None.  INDICATIONS FOR PROCEDURE:  The patient is a 47 year old gentleman who has had significant right knee pain for the past 6-8 months, increasing in nature with repetitive trauma.  Exam and MRI have revealed medial and lateral meniscal tears with  chondromalacia as well as a medial tibial plateau subchondral stress fracture.  He has failed conservative care and is now to undergo arthroscopy as well as a medial tibial plateau fixation for this fracture with the Zimmer subchondroplasty bone cement.  DESCRIPTION OF PROCEDURE:  The patient was brought in the operating room on 08/04/2018 after an adductor canal block was placed in the holding room by anesthesia.  He was placed on the operating table in supine position.  After being placed under general  anesthesia, his right knee was examined.  He had full range of motion.  The knee was stable with ligamentous  exam.  His knee was then sterilely injected with 0.25% Marcaine with epinephrine.  His right leg was then prepped using sterile DuraPrep and  draped using sterile technique.  Time-out procedure was called, and the correct right knee identified.  Initially, the arthroscopy was performed through an anterolateral portal.  The arthroscope with a pump attached was placed into an anteromedial  portal, and arthroscopic probe was placed.  On initial inspection of the medial compartment, the articular cartilage showed 25% grade III chondromalacia, which was debrided.  Medial meniscus showed tearing of the posterior and medial horn of which 50%  was resected back to a stable rim.  Intercondylar notch was inspected.  Anterior and posterior cruciate ligaments were normal.  Lateral compartment inspected.  He had grade I and II chondromalacia.  Lateral meniscus showed tearing in the posterior and  lateral horn of which 30% was resected back to a stable rim.  Patellofemoral joint showed 50% grade III chondromalacia, which was debrided.  The patella tracked normally.  Medial and lateral gutters were free of pathology.  At this point then attention  was turned to the fixation of the medial tibial plateau's fracture using the Zimmer subchondroplasty system.  Through a 1 cm medial tibial plateau incision and using fluoroscopic control, the trocar from the system was placed in the perfect position in  the medial tibial plateau in the AP and lateral plane.  This was correlated with the MRI findings.  With this in place then sequentially bone cement was used to inject into this fracture sequentially under fluoroscopic control until the entire area was  filled with the bone cement.  After this was done, there was found to be excellent positioning  of the implant in the medial tibial plateau fractured area.  The arthroscope was then placed back in the knee, and there was found to be no extravasation of  any cement into the joint.   At this point, it was felt that all pathology had been satisfactorily addressed.  The instruments were removed.  The portals and small incision were closed with a 3-0 nylon.  Sterile dressings were applied, and then the  patient awakened and taken to recovery room in stable condition.  Needle and sponge counts were correct x2 at the end of the case.  FOLLOWUP CARE:  The patient will be followed as an outpatient on oxycodone for pain.  We will see him back in the office in a week for sutures out and followup.  LN/NUANCE  D:08/04/2018 T:08/04/2018 JOB:[id number]

## 2018-08-04 NOTE — Anesthesia Procedure Notes (Signed)
Procedure Name: LMA Insertion Date/Time: 08/04/2018 10:41 AM Performed by: Sheryn Bison, CRNA Pre-anesthesia Checklist: Patient identified, Emergency Drugs available, Suction available and Patient being monitored Patient Re-evaluated:Patient Re-evaluated prior to induction Oxygen Delivery Method: Circle system utilized Preoxygenation: Pre-oxygenation with 100% oxygen Induction Type: IV induction Ventilation: Mask ventilation without difficulty LMA: LMA inserted LMA Size: 4.0 Number of attempts: 1 Airway Equipment and Method: Bite block Placement Confirmation: positive ETCO2 Tube secured with: Tape Dental Injury: Teeth and Oropharynx as per pre-operative assessment

## 2018-08-04 NOTE — Interval H&P Note (Signed)
History and Physical Interval Note:  08/04/2018 8:29 AM  Greg Wood  has presented today for surgery, with the diagnosis of TEAR OF MEDIAL MENISCUS,CHONDROMALACIA RIGHT KNEE,FRACTURE OF MEDIAL CONDYLE OF RIGHT TIBIA  The various methods of treatment have been discussed with the patient and family. After consideration of risks, benefits and other options for treatment, the patient has consented to  Procedure(s): KNEE ARTHROSCOPY WITH MEDIAL MENISECTOMY (Right) CHONDROPLASTY AND SUBCHONDROPLASTY MEDIAL TIBIAL PLATEAU (Right) as a surgical intervention .  The patient's history has been reviewed, patient examined, no change in status, stable for surgery.  I have reviewed the patient's chart and labs.  Questions were answered to the patient's satisfaction.     [name]

## 2018-08-04 NOTE — Discharge Instructions (Signed)
Regional Anesthesia Blocks ? ?1. Numbness or the inability to move the "blocked" extremity may last from 3-48 hours after placement. The length of time depends on the medication injected and your individual response to the medication. If the numbness is not going away after 48 hours, call your surgeon. ? ?2. The extremity that is blocked will need to be protected until the numbness is gone and the  Strength has returned. Because you cannot feel it, you will need to take extra care to avoid injury. Because it may be weak, you may have difficulty moving it or using it. You may not know what position it is in without looking at it while the block is in effect. ? ?3. For blocks in the legs and feet, returning to weight bearing and walking needs to be done carefully. You will need to wait until the numbness is entirely gone and the strength has returned. You should be able to move your leg and foot normally before you try and bear weight or walk. You will need someone to be with you when you first try to ensure you do not fall and possibly risk injury. ? ?4. Bruising and tenderness at the needle site are common side effects and will resolve in a few days. ? ?5. Persistent numbness or new problems with movement should be communicated to the surgeon or the [facility] ([phone])/ [facility] ([phone]).  ? ?Post Anesthesia Home Care Instructions ? ?Activity: ?Get plenty of rest for the remainder of the day. A responsible individual must stay with you for 24 hours following the procedure.  ?For the next 24 hours, DO NOT: ?-Drive a car ?-Operate machinery ?-Drink alcoholic beverages ?-Take any medication unless instructed by your physician ?-Make any legal decisions or sign important papers. ? ?Meals: ?Start with liquid foods such as gelatin or soup. Progress to regular foods as tolerated. Avoid greasy, spicy, heavy foods. If nausea and/or vomiting occur, drink only clear liquids until the  nausea and/or vomiting subsides. Call your physician if vomiting continues. ? ?Special Instructions/Symptoms: ?Your throat may feel dry or sore from the anesthesia or the breathing tube placed in your throat during surgery. If this causes discomfort, gargle with warm salt water. The discomfort should disappear within 24 hours. ? ?If you had a scopolamine patch placed behind your ear for the management of post- operative nausea and/or vomiting: ? ?1. The medication in the patch is effective for 72 hours, after which it should be removed.  Wrap patch in a tissue and discard in the trash. Wash hands thoroughly with soap and water. ?2. You may remove the patch earlier than 72 hours if you experience unpleasant side effects which may include dry mouth, dizziness or visual disturbances. ?3. Avoid touching the patch. Wash your hands with soap and water after contact with the patch. ?    ?

## 2018-08-04 NOTE — Anesthesia Preprocedure Evaluation (Addendum)
Anesthesia Evaluation  Patient identified by MRN, date of birth, ID band Patient awake    Reviewed: Allergy & Precautions, NPO status , Patient's Chart, lab work & pertinent test results  Airway Mallampati: II  TM Distance: >3 FB Neck ROM: Full    Dental no notable dental hx.    Pulmonary neg pulmonary ROS,    Pulmonary exam normal breath sounds clear to auscultation       Cardiovascular hypertension, Normal cardiovascular exam Rhythm:Regular Rate:Normal     Neuro/Psych negative neurological ROS  negative psych ROS   GI/Hepatic negative GI ROS, Neg liver ROS,   Endo/Other  diabetes  Renal/GU Renal diseasenegative Renal ROS     Musculoskeletal negative musculoskeletal ROS (+)   Abdominal   Peds  Hematology negative hematology ROS (+)   Anesthesia Other Findings   Reproductive/Obstetrics                             Anesthesia Physical Anesthesia Plan  ASA: II  Anesthesia Plan: General   Post-op Pain Management: GA combined w/ Regional for post-op pain   Induction: Intravenous  PONV Risk Score and Plan: 3 and Dexamethasone and Ondansetron  Airway Management Planned: LMA  Additional Equipment:   Intra-op Plan:   Post-operative Plan: Extubation in OR  Informed Consent: I have reviewed the patients History and Physical, chart, labs and discussed the procedure including the risks, benefits and alternatives for the proposed anesthesia with the patient or authorized representative who has indicated his/her understanding and acceptance.   Dental advisory given  Plan Discussed with: CRNA  Anesthesia Plan Comments:        Anesthesia Quick Evaluation

## 2018-08-04 NOTE — Progress Notes (Signed)
Assisted Dr. Germeroth with right, ultrasound guided, adductor canal block. Side rails up, monitors on throughout procedure. See vital signs in flow sheet. Tolerated Procedure well. 

## 2018-08-04 NOTE — Anesthesia Procedure Notes (Signed)
Anesthesia Regional Block: Adductor canal block   Pre-Anesthetic Checklist: ,, timeout performed, Correct Patient, Correct Site, Correct Laterality, Correct Procedure, Correct Position, site marked, Risks and benefits discussed,  Surgical consent,  Pre-op evaluation,  At surgeon's request and post-op pain management  Laterality: Right  Prep: chloraprep       Needles:  Injection technique: Single-shot  Needle Type: Stimiplex     Needle Length: 9cm  Needle Gauge: 21     Additional Needles:   Procedures:,,,, ultrasound used (permanent image in chart),,,,  Narrative:  Start time: 08/04/2018 10:13 AM End time: 08/04/2018 10:15 AM Injection made incrementally with aspirations every 5 mL.  Performed by: Personally  Anesthesiologist: Lewie Loron, MD  Additional Notes: BP cuff, EKG monitors applied. Sedation begun. Artery and nerve location verified with U/S and anesthetic injected incrementally, slowly, and after negative aspirations under direct u/s guidance. Good fascial /perineural spread. Tolerated well.

## 2018-08-04 NOTE — Transfer of Care (Signed)
Immediate Anesthesia Transfer of Care Note  Patient: Greg Wood  Procedure(s) Performed: KNEE ARTHROSCOPY WITH MEDIAL MENISECTOMY (Right Knee) CHONDROPLASTY AND SUBCHONDROPLASTY MEDIAL TIBIAL PLATEAU (Right Knee) KNEE ARTHROSCOPY WITH LATERAL MENISECTOMY (Right Knee) CHONDROPLASTY (Right Knee)  Patient Location: PACU  Anesthesia Type:GA combined with regional for post-op pain  Level of Consciousness: awake and patient cooperative  Airway & Oxygen Therapy: Patient Spontanous Breathing and Patient connected to face mask oxygen  Post-op Assessment: Report given to RN and Post -op Vital signs reviewed and stable  Post vital signs: Reviewed and stable  Last Vitals:  Vitals Value Taken Time  BP    Temp    Pulse 75 08/04/2018 12:01 PM  Resp 0 08/04/2018 12:01 PM  SpO2 100 % 08/04/2018 12:01 PM  Vitals shown include unvalidated device data.  Last Pain:  Vitals:   08/04/18 0850  TempSrc: Oral  PainSc: 0-No pain         Complications: No apparent anesthesia complications

## 2018-08-05 ENCOUNTER — Encounter (HOSPITAL_BASED_OUTPATIENT_CLINIC_OR_DEPARTMENT_OTHER): Payer: Self-pay | Admitting: Orthopedic Surgery

## 2018-08-05 NOTE — Anesthesia Postprocedure Evaluation (Signed)
Anesthesia Post Note  Patient: Darolyn Ruandre E Stohr  Procedure(s) Performed: KNEE ARTHROSCOPY WITH MEDIAL MENISECTOMY (Right Knee) CHONDROPLASTY AND SUBCHONDROPLASTY MEDIAL TIBIAL PLATEAU (Right Knee) KNEE ARTHROSCOPY WITH LATERAL MENISECTOMY (Right Knee) CHONDROPLASTY (Right Knee)     Patient location during evaluation: PACU Anesthesia Type: General Level of consciousness: sedated and patient cooperative Pain management: pain level controlled Vital Signs Assessment: post-procedure vital signs reviewed and stable Respiratory status: spontaneous breathing Cardiovascular status: stable Anesthetic complications: no    Last Vitals:  Vitals:   08/04/18 1245 08/04/18 1320  BP: (!) 163/104 (!) 166/98  Pulse: 79 73  Resp: 16 16  Temp:  36.8 C  SpO2: 98% 97%    Last Pain:  Vitals:   08/04/18 1320  TempSrc: Oral  PainSc: 1    Pain Goal: Patients Stated Pain Goal: 3 (08/04/18 1320)               Lewie LoronJohn Chike Farrington

## 2018-08-12 DIAGNOSIS — S82141D Displaced bicondylar fracture of right tibia, subsequent encounter for closed fracture with routine healing: Secondary | ICD-10-CM | POA: Diagnosis not present

## 2018-08-12 DIAGNOSIS — S82241D Displaced spiral fracture of shaft of right tibia, subsequent encounter for closed fracture with routine healing: Secondary | ICD-10-CM | POA: Diagnosis not present

## 2018-08-16 ENCOUNTER — Other Ambulatory Visit: Payer: Self-pay | Admitting: Family Medicine

## 2018-08-19 ENCOUNTER — Other Ambulatory Visit: Payer: Self-pay | Admitting: Family Medicine

## 2018-08-19 DIAGNOSIS — R262 Difficulty in walking, not elsewhere classified: Secondary | ICD-10-CM | POA: Diagnosis not present

## 2018-08-19 DIAGNOSIS — M25561 Pain in right knee: Secondary | ICD-10-CM | POA: Diagnosis not present

## 2018-08-19 DIAGNOSIS — M25611 Stiffness of right shoulder, not elsewhere classified: Secondary | ICD-10-CM | POA: Diagnosis not present

## 2018-08-19 DIAGNOSIS — M25461 Effusion, right knee: Secondary | ICD-10-CM | POA: Diagnosis not present

## 2018-08-20 ENCOUNTER — Other Ambulatory Visit: Payer: Self-pay | Admitting: Family Medicine

## 2018-08-24 DIAGNOSIS — M25561 Pain in right knee: Secondary | ICD-10-CM | POA: Diagnosis not present

## 2018-08-24 DIAGNOSIS — M25461 Effusion, right knee: Secondary | ICD-10-CM | POA: Diagnosis not present

## 2018-08-24 DIAGNOSIS — M25661 Stiffness of right knee, not elsewhere classified: Secondary | ICD-10-CM | POA: Diagnosis not present

## 2018-08-24 DIAGNOSIS — R262 Difficulty in walking, not elsewhere classified: Secondary | ICD-10-CM | POA: Diagnosis not present

## 2018-08-25 DIAGNOSIS — M25461 Effusion, right knee: Secondary | ICD-10-CM | POA: Diagnosis not present

## 2018-08-26 DIAGNOSIS — M25661 Stiffness of right knee, not elsewhere classified: Secondary | ICD-10-CM | POA: Diagnosis not present

## 2018-08-26 DIAGNOSIS — R262 Difficulty in walking, not elsewhere classified: Secondary | ICD-10-CM | POA: Diagnosis not present

## 2018-08-26 DIAGNOSIS — M25461 Effusion, right knee: Secondary | ICD-10-CM | POA: Diagnosis not present

## 2018-08-26 DIAGNOSIS — M25561 Pain in right knee: Secondary | ICD-10-CM | POA: Diagnosis not present

## 2018-08-31 DIAGNOSIS — M25561 Pain in right knee: Secondary | ICD-10-CM | POA: Diagnosis not present

## 2018-08-31 DIAGNOSIS — M25661 Stiffness of right knee, not elsewhere classified: Secondary | ICD-10-CM | POA: Diagnosis not present

## 2018-08-31 DIAGNOSIS — M25461 Effusion, right knee: Secondary | ICD-10-CM | POA: Diagnosis not present

## 2018-08-31 DIAGNOSIS — R262 Difficulty in walking, not elsewhere classified: Secondary | ICD-10-CM | POA: Diagnosis not present

## 2018-09-02 DIAGNOSIS — M25461 Effusion, right knee: Secondary | ICD-10-CM | POA: Diagnosis not present

## 2018-09-02 DIAGNOSIS — M25661 Stiffness of right knee, not elsewhere classified: Secondary | ICD-10-CM | POA: Diagnosis not present

## 2018-09-02 DIAGNOSIS — M25561 Pain in right knee: Secondary | ICD-10-CM | POA: Diagnosis not present

## 2018-09-02 DIAGNOSIS — R262 Difficulty in walking, not elsewhere classified: Secondary | ICD-10-CM | POA: Diagnosis not present

## 2018-09-03 ENCOUNTER — Other Ambulatory Visit: Payer: Self-pay

## 2018-09-03 ENCOUNTER — Encounter: Payer: Self-pay | Admitting: Family Medicine

## 2018-09-03 ENCOUNTER — Ambulatory Visit (INDEPENDENT_AMBULATORY_CARE_PROVIDER_SITE_OTHER): Payer: BLUE CROSS/BLUE SHIELD | Admitting: Family Medicine

## 2018-09-03 VITALS — BP 151/95 | HR 78 | Temp 98.5°F | Wt 223.0 lb

## 2018-09-03 DIAGNOSIS — E782 Mixed hyperlipidemia: Secondary | ICD-10-CM

## 2018-09-03 DIAGNOSIS — E1165 Type 2 diabetes mellitus with hyperglycemia: Secondary | ICD-10-CM | POA: Diagnosis not present

## 2018-09-03 DIAGNOSIS — I129 Hypertensive chronic kidney disease with stage 1 through stage 4 chronic kidney disease, or unspecified chronic kidney disease: Secondary | ICD-10-CM | POA: Diagnosis not present

## 2018-09-03 DIAGNOSIS — Z125 Encounter for screening for malignant neoplasm of prostate: Secondary | ICD-10-CM | POA: Diagnosis not present

## 2018-09-03 DIAGNOSIS — R319 Hematuria, unspecified: Secondary | ICD-10-CM

## 2018-09-03 LAB — UA/M W/RFLX CULTURE, ROUTINE
Bilirubin, UA: NEGATIVE
Ketones, UA: NEGATIVE
LEUKOCYTES UA: NEGATIVE
NITRITE UA: NEGATIVE
Protein, UA: NEGATIVE
RBC, UA: NEGATIVE
Specific Gravity, UA: 1.015 (ref 1.005–1.030)
Urobilinogen, Ur: 0.2 mg/dL (ref 0.2–1.0)
pH, UA: 6 (ref 5.0–7.5)

## 2018-09-03 LAB — BAYER DCA HB A1C WAIVED: HB A1C: 7.9 % — AB (ref <7.0)

## 2018-09-03 LAB — MICROALBUMIN, URINE WAIVED
Creatinine, Urine Waived: 50 mg/dL (ref 10–300)
MICROALB, UR WAIVED: 10 mg/L (ref 0–19)

## 2018-09-03 MED ORDER — EMPAGLIFLOZIN 25 MG PO TABS: 25.0000 mg | ORAL_TABLET | Freq: Every day | ORAL | 1 refills | Status: DC

## 2018-09-03 MED ORDER — ROSUVASTATIN CALCIUM 20 MG PO TABS: ORAL_TABLET | ORAL | 1 refills | Status: DC

## 2018-09-03 MED ORDER — METFORMIN HCL ER 500 MG PO TB24: ORAL_TABLET | ORAL | 1 refills | Status: DC

## 2018-09-03 MED ORDER — LISINOPRIL-HYDROCHLOROTHIAZIDE 20-12.5 MG PO TABS: 1.0000 | ORAL_TABLET | Freq: Every day | ORAL | 1 refills | Status: DC

## 2018-09-03 NOTE — Progress Notes (Signed)
BP (!) 151/95   Pulse 78   Temp 98.5 F (36.9 C) (Oral)   Wt 223 lb (101.2 kg)   SpO2 99%   BMI 30.24 kg/m    Subjective:    Patient ID: Greg Wood, male    DOB: 1970/12/27, 47 y.o.   MRN: [id number]  HPI: Greg Wood is a 47 y.o. male  Chief Complaint  Patient presents with  . Diabetes  . Hypertension  . Hyperlipidemia   HYPERTENSION / HYPERLIPIDEMIA Satisfied with current treatment? yes Duration of hypertension: chronic BP monitoring frequency: not checking BP medication side effects: no Past BP meds: lisinopril, HCTZ Duration of hyperlipidemia: chronic Cholesterol medication side effects: no Cholesterol supplements: none Past cholesterol medications: crestor Medication compliance: excellent compliance Aspirin: no Recent stressors: no Recurrent headaches: no Visual changes: no Palpitations: no Dyspnea: no Chest pain: no Lower extremity edema: no Dizzy/lightheaded: no  DIABETES Hypoglycemic episodes:no Polydipsia/polyuria: yes Visual disturbance: no Chest pain: no Paresthesias: no Glucose Monitoring: no  Accucheck frequency: Not Checking Taking Insulin?: no Blood Pressure Monitoring: not checking Retinal Examination: Not up to Date Foot Exam: Up to Date Diabetic Education: Not Completed Pneumovax: Up to Date Influenza: Up to Date Aspirin: no  Relevant past medical, surgical, family and social history reviewed and updated as indicated. Interim medical history since our last visit reviewed. Allergies and medications reviewed and updated.  Review of Systems  Constitutional: Negative.   Respiratory: Negative.   Cardiovascular: Negative.   Neurological: Negative.   Psychiatric/Behavioral: Negative.     Per HPI unless specifically indicated above     Objective:    BP (!) 151/95   Pulse 78   Temp 98.5 F (36.9 C) (Oral)   Wt 223 lb (101.2 kg)   SpO2 99%   BMI 30.24 kg/m   Wt Readings from Last 3 Encounters:  09/03/18 223 lb (101.2  kg)  08/04/18 217 lb 13 oz (98.8 kg)  11/30/17 233 lb 5 oz (105.8 kg)    Physical Exam  Constitutional: He is oriented to person, place, and time. He appears well-developed and well-nourished. No distress.  HENT:  Head: Normocephalic and atraumatic.  Right Ear: Hearing normal.  Left Ear: Hearing normal.  Nose: Nose normal.  Eyes: Conjunctivae and lids are normal. Right eye exhibits no discharge. Left eye exhibits no discharge. No scleral icterus.  Cardiovascular: Normal rate, regular rhythm, normal heart sounds and intact distal pulses. Exam reveals no gallop and no friction rub.  No murmur heard. Pulmonary/Chest: Effort normal and breath sounds normal. No stridor. No respiratory distress. He has no wheezes. He has no rales. He exhibits no tenderness.  Musculoskeletal: Normal range of motion.  Neurological: He is alert and oriented to person, place, and time.  Skin: Skin is warm, dry and intact. Capillary refill takes less than 2 seconds. No rash noted. He is not diaphoretic. No erythema. No pallor.  Psychiatric: He has a normal mood and affect. His speech is normal and behavior is normal. Judgment and thought content normal. Cognition and memory are normal.  Nursing note and vitals reviewed.   Results for orders placed or performed during the hospital encounter of 08/04/18  Glucose, capillary  Result Value Ref Range   Glucose-Capillary 123 (H) 70 - 99 mg/dL  Glucose, capillary  Result Value Ref Range   Glucose-Capillary 121 (H) 70 - 99 mg/dL      Assessment & Plan:   Problem List Items Addressed This Visit      Endocrine  Diabetes mellitus with hyperglycemia (HCC) - Primary    Doing slightly better with A1c of 7.9- still too high. Has been on prednisone. Will continue current regimen and recheck 3 months. Call with any concerns.       Relevant Medications   lisinopril-hydrochlorothiazide (ZESTORETIC) 20-12.5 MG tablet   rosuvastatin (CRESTOR) 20 MG tablet   metFORMIN  (GLUCOPHAGE-XR) 500 MG 24 hr tablet   empagliflozin (JARDIANCE) 25 MG TABS tablet   Other Relevant Orders   Bayer DCA Hb A1c Waived   CBC with Differential/Platelet   Comprehensive metabolic panel   Microalbumin, Urine Waived   TSH   Ambulatory referral to Ophthalmology     Genitourinary   Benign hypertensive renal disease    Not under good control. Will increase his medicine to 40mg  lisinopril and recheck 1 month. Call with any concerns.       Relevant Orders   CBC with Differential/Platelet   Comprehensive metabolic panel   Microalbumin, Urine Waived   TSH     Other   Blood in the urine    Rechecking levels today. Await results.       Relevant Orders   CBC with Differential/Platelet   Comprehensive metabolic panel   TSH   UA/M w/rflx Culture, Routine   Hyperlipidemia    Under good control on current regimen. Continue current regimen. Continue to monitor. Call with any concerns. Refills given.        Relevant Medications   lisinopril-hydrochlorothiazide (ZESTORETIC) 20-12.5 MG tablet   rosuvastatin (CRESTOR) 20 MG tablet   Other Relevant Orders   CBC with Differential/Platelet   Comprehensive metabolic panel   Lipid Panel w/o Chol/HDL Ratio   TSH    Other Visit Diagnoses    Screening for prostate cancer       Labs drawn today. Await results.    Relevant Orders   PSA       Follow up plan: Return in about 4 weeks (around 10/01/2018) for Follow up BP.

## 2018-09-03 NOTE — Assessment & Plan Note (Signed)
Doing slightly better with A1c of 7.9- still too high. Has been on prednisone. Will continue current regimen and recheck 3 months. Call with any concerns.

## 2018-09-03 NOTE — Assessment & Plan Note (Signed)
Not under good control. Will increase his medicine to 40mg  lisinopril and recheck 1 month. Call with any concerns.

## 2018-09-03 NOTE — Assessment & Plan Note (Signed)
Rechecking levels today. Await results.  

## 2018-09-03 NOTE — Assessment & Plan Note (Signed)
Under good control on current regimen. Continue current regimen. Continue to monitor. Call with any concerns. Refills given.   

## 2018-09-04 LAB — COMPREHENSIVE METABOLIC PANEL
A/G RATIO: 1.9 (ref 1.2–2.2)
ALK PHOS: 55 IU/L (ref 39–117)
ALT: 33 IU/L (ref 0–44)
AST: 18 IU/L (ref 0–40)
Albumin: 4.6 g/dL (ref 3.5–5.5)
BUN / CREAT RATIO: 19 (ref 9–20)
BUN: 17 mg/dL (ref 6–24)
Bilirubin Total: 0.3 mg/dL (ref 0.0–1.2)
CO2: 23 mmol/L (ref 20–29)
Calcium: 9.8 mg/dL (ref 8.7–10.2)
Chloride: 101 mmol/L (ref 96–106)
Creatinine, Ser: 0.88 mg/dL (ref 0.76–1.27)
GFR calc Af Amer: 117 mL/min/{1.73_m2} (ref >59)
GFR calc non Af Amer: 101 mL/min/{1.73_m2} (ref >59)
GLOBULIN, TOTAL: 2.4 g/dL (ref 1.5–4.5)
Glucose: 123 mg/dL — ABNORMAL HIGH (ref 65–99)
POTASSIUM: 4.4 mmol/L (ref 3.5–5.2)
SODIUM: 142 mmol/L (ref 134–144)
Total Protein: 7 g/dL (ref 6.0–8.5)

## 2018-09-04 LAB — CBC WITH DIFFERENTIAL/PLATELET
Basophils Absolute: 0 x10E3/uL (ref 0.0–0.2)
Basos: 1 %
EOS (ABSOLUTE): 0.1 x10E3/uL (ref 0.0–0.4)
Eos: 2 %
Hematocrit: 42 % (ref 37.5–51.0)
Hemoglobin: 13.8 g/dL (ref 13.0–17.7)
Immature Grans (Abs): 0 x10E3/uL (ref 0.0–0.1)
Immature Granulocytes: 0 %
Lymphocytes Absolute: 1.9 x10E3/uL (ref 0.7–3.1)
Lymphs: 43 %
MCH: 28.3 pg (ref 26.6–33.0)
MCHC: 32.9 g/dL (ref 31.5–35.7)
MCV: 86 fL (ref 79–97)
Monocytes Absolute: 0.6 x10E3/uL (ref 0.1–0.9)
Monocytes: 14 %
Neutrophils Absolute: 1.7 x10E3/uL (ref 1.4–7.0)
Neutrophils: 40 %
Platelets: 193 x10E3/uL (ref 150–450)
RBC: 4.88 x10E6/uL (ref 4.14–5.80)
RDW: 13.2 % (ref 12.3–15.4)
WBC: 4.3 x10E3/uL (ref 3.4–10.8)

## 2018-09-04 LAB — TSH: TSH: 0.858 u[IU]/mL (ref 0.450–4.500)

## 2018-09-04 LAB — LIPID PANEL W/O CHOL/HDL RATIO
Cholesterol, Total: 166 mg/dL (ref 100–199)
HDL: 43 mg/dL
LDL Calculated: 61 mg/dL (ref 0–99)
Triglycerides: 312 mg/dL — ABNORMAL HIGH (ref 0–149)
VLDL Cholesterol Cal: 62 mg/dL — ABNORMAL HIGH (ref 5–40)

## 2018-09-04 LAB — PSA: Prostate Specific Ag, Serum: 0.5 ng/mL (ref 0.0–4.0)

## 2018-09-07 DIAGNOSIS — M25561 Pain in right knee: Secondary | ICD-10-CM | POA: Diagnosis not present

## 2018-09-07 DIAGNOSIS — M25661 Stiffness of right knee, not elsewhere classified: Secondary | ICD-10-CM | POA: Diagnosis not present

## 2018-09-07 DIAGNOSIS — R262 Difficulty in walking, not elsewhere classified: Secondary | ICD-10-CM | POA: Diagnosis not present

## 2018-09-07 DIAGNOSIS — M25461 Effusion, right knee: Secondary | ICD-10-CM | POA: Diagnosis not present

## 2018-09-09 DIAGNOSIS — R262 Difficulty in walking, not elsewhere classified: Secondary | ICD-10-CM | POA: Diagnosis not present

## 2018-09-09 DIAGNOSIS — M25661 Stiffness of right knee, not elsewhere classified: Secondary | ICD-10-CM | POA: Diagnosis not present

## 2018-09-09 DIAGNOSIS — M25461 Effusion, right knee: Secondary | ICD-10-CM | POA: Diagnosis not present

## 2018-09-09 DIAGNOSIS — M25561 Pain in right knee: Secondary | ICD-10-CM | POA: Diagnosis not present

## 2018-09-14 DIAGNOSIS — R262 Difficulty in walking, not elsewhere classified: Secondary | ICD-10-CM | POA: Diagnosis not present

## 2018-09-14 DIAGNOSIS — M25561 Pain in right knee: Secondary | ICD-10-CM | POA: Diagnosis not present

## 2018-09-14 DIAGNOSIS — M25661 Stiffness of right knee, not elsewhere classified: Secondary | ICD-10-CM | POA: Diagnosis not present

## 2018-09-14 DIAGNOSIS — M25461 Effusion, right knee: Secondary | ICD-10-CM | POA: Diagnosis not present

## 2018-09-16 DIAGNOSIS — M25461 Effusion, right knee: Secondary | ICD-10-CM | POA: Diagnosis not present

## 2018-09-16 DIAGNOSIS — R262 Difficulty in walking, not elsewhere classified: Secondary | ICD-10-CM | POA: Diagnosis not present

## 2018-09-16 DIAGNOSIS — M25561 Pain in right knee: Secondary | ICD-10-CM | POA: Diagnosis not present

## 2018-09-16 DIAGNOSIS — M25661 Stiffness of right knee, not elsewhere classified: Secondary | ICD-10-CM | POA: Diagnosis not present

## 2018-09-20 ENCOUNTER — Other Ambulatory Visit: Payer: Self-pay | Admitting: Family Medicine

## 2018-09-21 ENCOUNTER — Other Ambulatory Visit: Payer: Self-pay | Admitting: Family Medicine

## 2018-09-21 DIAGNOSIS — M25661 Stiffness of right knee, not elsewhere classified: Secondary | ICD-10-CM | POA: Diagnosis not present

## 2018-09-21 DIAGNOSIS — R262 Difficulty in walking, not elsewhere classified: Secondary | ICD-10-CM | POA: Diagnosis not present

## 2018-09-21 DIAGNOSIS — M25461 Effusion, right knee: Secondary | ICD-10-CM | POA: Diagnosis not present

## 2018-09-21 DIAGNOSIS — M25561 Pain in right knee: Secondary | ICD-10-CM | POA: Diagnosis not present

## 2018-09-21 NOTE — Telephone Encounter (Signed)
Requested Prescriptions  Pending Prescriptions Disp Refills  . metFORMIN (GLUCOPHAGE-XR) 500 MG 24 hr tablet [Pharmacy Med Name: METFORMIN ER '500MG'$  24HR TABS] 120 tablet 0    Sig: TAKE 2 TABLETS(1000 MG) BY MOUTH TWICE DAILY     Endocrinology:  Diabetes - Biguanides Passed - 09/20/2018 12:16 PM      Passed - Cr in normal range and within 360 days    Creatinine, Ser  Date Value Ref Range Status  09/03/2018 0.88 0.76 - 1.27 mg/dL Final         Passed - HBA1C is between 0 and 7.9 and within 180 days    Hgb A1c MFr Bld  Date Value Ref Range Status  11/13/2016 8.7 (H) 4.8 - 5.6 % Final    Comment:             Pre-diabetes: 5.7 - 6.4          Diabetes: >6.4          Glycemic control for adults with diabetes: <7.0          Passed - eGFR in normal range and within 360 days    GFR calc Af Amer  Date Value Ref Range Status  09/03/2018 117 >59 mL/min/1.73 Final   GFR calc non Af Amer  Date Value Ref Range Status  09/03/2018 101 >59 mL/min/1.73 Final         Passed - Valid encounter within last 6 months    Recent Outpatient Visits          2 weeks ago Type 2 diabetes mellitus with hyperglycemia, without long-term current use of insulin ([place])   [place], Megan P, DO   9 months ago Type 2 diabetes mellitus with hyperglycemia, without long-term current use of insulin ([place])   [place], Megan P, DO   1 year ago Type 2 diabetes mellitus with hyperglycemia, without long-term current use of insulin ([place])   [place], [place], DO   1 year ago Mixed hyperlipidemia   [facility] [place], [place]   1 year ago Mixed hyperlipidemia   [place], Lilia Argue, [place]      Future Appointments            In 2 weeks Wynetta Emery, Barb Merino, DO [employer], [facility]

## 2018-09-21 NOTE — Telephone Encounter (Signed)
Requested Prescriptions  Pending Prescriptions Disp Refills  . JARDIANCE 25 MG TABS tablet [Pharmacy Med Name: JARDIANCE '25MG'$  TABLETS] 30 tablet 0    Sig: TAKE 1 TABLET BY MOUTH DAILY     Endocrinology:  Diabetes - SGLT2 Inhibitors Passed - 09/21/2018  3:18 AM      Passed - Cr in normal range and within 360 days    Creatinine, Ser  Date Value Ref Range Status  09/03/2018 0.88 0.76 - 1.27 mg/dL Final         Passed - LDL in normal range and within 360 days    LDL Calculated  Date Value Ref Range Status  09/03/2018 61 0 - 99 mg/dL Final         Passed - HBA1C is between 0 and 7.9 and within 180 days    Hgb A1c MFr Bld  Date Value Ref Range Status  11/13/2016 8.7 (H) 4.8 - 5.6 % Final    Comment:             Pre-diabetes: 5.7 - 6.4          Diabetes: >6.4          Glycemic control for adults with diabetes: <7.0          Passed - eGFR in normal range and within 360 days    GFR calc Af Amer  Date Value Ref Range Status  09/03/2018 117 >59 mL/min/1.73 Final   GFR calc non Af Amer  Date Value Ref Range Status  09/03/2018 101 >59 mL/min/1.73 Final         Passed - Valid encounter within last 6 months    Recent Outpatient Visits          2 weeks ago Type 2 diabetes mellitus with hyperglycemia, without long-term current use of insulin ([place])   [place], Megan P, DO   9 months ago Type 2 diabetes mellitus with hyperglycemia, without long-term current use of insulin ([place])   [place], Megan P, DO   1 year ago Type 2 diabetes mellitus with hyperglycemia, without long-term current use of insulin ([place])   [place], [place], DO   1 year ago Mixed hyperlipidemia   [facility] [employer], [place]   1 year ago Mixed hyperlipidemia   [place], Lilia Argue, [place]      Future Appointments            In 2 weeks Wynetta Emery, Barb Merino, DO [employer], [facility]

## 2018-09-23 ENCOUNTER — Other Ambulatory Visit: Payer: Self-pay | Admitting: Family Medicine

## 2018-09-23 DIAGNOSIS — R262 Difficulty in walking, not elsewhere classified: Secondary | ICD-10-CM | POA: Diagnosis not present

## 2018-09-23 DIAGNOSIS — M25561 Pain in right knee: Secondary | ICD-10-CM | POA: Diagnosis not present

## 2018-09-23 DIAGNOSIS — M25461 Effusion, right knee: Secondary | ICD-10-CM | POA: Diagnosis not present

## 2018-09-23 DIAGNOSIS — M25661 Stiffness of right knee, not elsewhere classified: Secondary | ICD-10-CM | POA: Diagnosis not present

## 2018-09-28 DIAGNOSIS — R262 Difficulty in walking, not elsewhere classified: Secondary | ICD-10-CM | POA: Diagnosis not present

## 2018-09-28 DIAGNOSIS — M25661 Stiffness of right knee, not elsewhere classified: Secondary | ICD-10-CM | POA: Diagnosis not present

## 2018-09-28 DIAGNOSIS — M25461 Effusion, right knee: Secondary | ICD-10-CM | POA: Diagnosis not present

## 2018-09-28 DIAGNOSIS — M25561 Pain in right knee: Secondary | ICD-10-CM | POA: Diagnosis not present

## 2018-09-30 DIAGNOSIS — M25561 Pain in right knee: Secondary | ICD-10-CM | POA: Diagnosis not present

## 2018-09-30 DIAGNOSIS — M25461 Effusion, right knee: Secondary | ICD-10-CM | POA: Diagnosis not present

## 2018-09-30 DIAGNOSIS — M25661 Stiffness of right knee, not elsewhere classified: Secondary | ICD-10-CM | POA: Diagnosis not present

## 2018-09-30 DIAGNOSIS — R262 Difficulty in walking, not elsewhere classified: Secondary | ICD-10-CM | POA: Diagnosis not present

## 2018-10-05 ENCOUNTER — Encounter: Payer: BLUE CROSS/BLUE SHIELD | Admitting: Family Medicine

## 2018-10-05 DIAGNOSIS — M25561 Pain in right knee: Secondary | ICD-10-CM | POA: Diagnosis not present

## 2018-10-05 DIAGNOSIS — R262 Difficulty in walking, not elsewhere classified: Secondary | ICD-10-CM | POA: Diagnosis not present

## 2018-10-05 DIAGNOSIS — M25461 Effusion, right knee: Secondary | ICD-10-CM | POA: Diagnosis not present

## 2018-10-05 DIAGNOSIS — M25661 Stiffness of right knee, not elsewhere classified: Secondary | ICD-10-CM | POA: Diagnosis not present

## 2018-10-07 DIAGNOSIS — M25661 Stiffness of right knee, not elsewhere classified: Secondary | ICD-10-CM | POA: Diagnosis not present

## 2018-10-07 DIAGNOSIS — R262 Difficulty in walking, not elsewhere classified: Secondary | ICD-10-CM | POA: Diagnosis not present

## 2018-10-07 DIAGNOSIS — M25561 Pain in right knee: Secondary | ICD-10-CM | POA: Diagnosis not present

## 2018-10-07 DIAGNOSIS — M25461 Effusion, right knee: Secondary | ICD-10-CM | POA: Diagnosis not present

## 2018-10-12 DIAGNOSIS — M25561 Pain in right knee: Secondary | ICD-10-CM | POA: Diagnosis not present

## 2018-10-12 DIAGNOSIS — M25661 Stiffness of right knee, not elsewhere classified: Secondary | ICD-10-CM | POA: Diagnosis not present

## 2018-10-12 DIAGNOSIS — R262 Difficulty in walking, not elsewhere classified: Secondary | ICD-10-CM | POA: Diagnosis not present

## 2018-10-12 DIAGNOSIS — M25461 Effusion, right knee: Secondary | ICD-10-CM | POA: Diagnosis not present

## 2018-10-19 ENCOUNTER — Other Ambulatory Visit: Payer: Self-pay | Admitting: Family Medicine

## 2018-10-19 DIAGNOSIS — M25661 Stiffness of right knee, not elsewhere classified: Secondary | ICD-10-CM | POA: Diagnosis not present

## 2018-10-19 DIAGNOSIS — M25561 Pain in right knee: Secondary | ICD-10-CM | POA: Diagnosis not present

## 2018-10-19 DIAGNOSIS — M25461 Effusion, right knee: Secondary | ICD-10-CM | POA: Diagnosis not present

## 2018-10-19 DIAGNOSIS — R262 Difficulty in walking, not elsewhere classified: Secondary | ICD-10-CM | POA: Diagnosis not present

## 2018-10-23 ENCOUNTER — Other Ambulatory Visit: Payer: Self-pay | Admitting: Family Medicine

## 2019-01-22 ENCOUNTER — Other Ambulatory Visit: Payer: Self-pay | Admitting: Family Medicine

## 2019-01-24 NOTE — Telephone Encounter (Signed)
Requested Prescriptions  Pending Prescriptions Disp Refills  . metFORMIN (GLUCOPHAGE-XR) 500 MG 24 hr tablet [Pharmacy Med Name: METFORMIN ER '500MG'$  24HR TABS] 360 tablet 0    Sig: TAKE 2 TABLETS(1000 MG) BY MOUTH TWICE DAILY     Endocrinology:  Diabetes - Biguanides Passed - 01/22/2019  3:15 AM      Passed - Cr in normal range and within 360 days    Creatinine, Ser  Date Value Ref Range Status  09/03/2018 0.88 0.76 - 1.27 mg/dL Final         Passed - HBA1C is between 0 and 7.9 and within 180 days    HB A1C (BAYER DCA - WAIVED)  Date Value Ref Range Status  09/03/2018 7.9 (H) <7.0 % Final    Comment:                                          Diabetic Adult            <7.0                                       Healthy Adult        4.3 - 5.7                                                           (DCCT/NGSP) American Diabetes Association's Summary of Glycemic Recommendations for Adults with Diabetes: Hemoglobin A1c <7.0%. More stringent glycemic goals (A1c <6.0%) may further reduce complications at the cost of increased risk of hypoglycemia.          Passed - eGFR in normal range and within 360 days    GFR calc Af Amer  Date Value Ref Range Status  09/03/2018 117 >59 mL/min/1.73 Final   GFR calc non Af Amer  Date Value Ref Range Status  09/03/2018 101 >59 mL/min/1.73 Final         Passed - Valid encounter within last 6 months    Recent Outpatient Visits          4 months ago Type 2 diabetes mellitus with hyperglycemia, without long-term current use of insulin ([place])   [place], Megan P, DO   1 year ago Type 2 diabetes mellitus with hyperglycemia, without long-term current use of insulin ([place])   [place], Megan P, DO   1 year ago Type 2 diabetes mellitus with hyperglycemia, without long-term current use of insulin [facility])   [place], [place], DO   1 year ago Mixed hyperlipidemia   [facility]  Merrie Roof [place], [place]   2 years ago Mixed hyperlipidemia   [facility] Merrie Roof [place], [place]

## 2019-01-30 ENCOUNTER — Other Ambulatory Visit: Payer: Self-pay

## 2019-01-30 ENCOUNTER — Encounter: Payer: Self-pay | Admitting: Gynecology

## 2019-01-30 ENCOUNTER — Ambulatory Visit
Admission: EM | Admit: 2019-01-30 | Discharge: 2019-01-30 | Disposition: A | Discharge status: Home / Self Care | Payer: BLUE CROSS/BLUE SHIELD | Attending: Emergency Medicine | Admitting: Emergency Medicine

## 2019-01-30 DIAGNOSIS — R05 Cough: Secondary | ICD-10-CM | POA: Diagnosis not present

## 2019-01-30 DIAGNOSIS — J029 Acute pharyngitis, unspecified: Secondary | ICD-10-CM | POA: Diagnosis not present

## 2019-01-30 DIAGNOSIS — J0140 Acute pansinusitis, unspecified: Secondary | ICD-10-CM

## 2019-01-30 DIAGNOSIS — R059 Cough, unspecified: Secondary | ICD-10-CM

## 2019-01-30 DIAGNOSIS — R5383 Other fatigue: Secondary | ICD-10-CM | POA: Diagnosis not present

## 2019-01-30 DIAGNOSIS — R6883 Chills (without fever): Secondary | ICD-10-CM | POA: Diagnosis not present

## 2019-01-30 LAB — RAPID INFLUENZA A&B ANTIGENS
Influenza A (ARMC): NEGATIVE
Influenza B (ARMC): NEGATIVE

## 2019-01-30 MED ORDER — AMOXICILLIN-POT CLAVULANATE 875-125 MG PO TABS: 1.0000 | ORAL_TABLET | Freq: Two times a day (BID) | ORAL | 0 refills | Status: AC

## 2019-01-30 MED ORDER — HYDROCOD POLST-CPM POLST ER 10-8 MG/5ML PO SUER: 5.0000 mL | Freq: Every evening | ORAL | 0 refills | Status: DC | PRN

## 2019-01-30 NOTE — ED Triage Notes (Signed)
Patient c/o flu like symptoms/ coughing / headache x 1 week.

## 2019-01-30 NOTE — ED Provider Notes (Signed)
[facility]    CSN: [id number] Arrival date & time: 01/30/19  1408     History   Chief Complaint No chief complaint on file.   HPI Greg Wood is a 48 y.o. male.   48 year old male presents with nasal congestion, sinus pressure, headache and coughing for over a week. Also having a low grade fever and irritated throat. Denies any GI symptoms. Sinus pressure has gotten worse over the past few days. He has tried Tylenol, Mucinex, and Delsym with minimal relief. Other chronic health issues include HTN, hyperlipidemia and type 2 DM. Does not smoke. Currently on Lisinopril-HCTZ, Crestor, Glucophage and Jardiance daily.    The history is provided by the patient.    Past Medical History:  Diagnosis Date  . Acute bronchitis   . Acute lateral meniscus tear of right knee   . Acute medial meniscus tear of right knee   . Allergic rhinitis due to pollen   . Blood in the urine   . Diabetes mellitus without complication (HCC)   . Hypercalcemia    Resolved on recheck  . Hyperlipidemia   . Hypertension   . Right medial tibial plateau fracture, closed, initial encounter     Patient Active Problem List   Diagnosis Date Noted  . Acute medial meniscus tear of right knee   . Acute lateral meniscus tear of right knee   . Right medial tibial plateau fracture, closed, initial encounter   . Noncompliance 01/28/2016  . Blood in the urine   . Allergic rhinitis due to pollen   . Diabetes mellitus with hyperglycemia (HCC)   . Hyperlipidemia   . Benign hypertensive renal disease     Past Surgical History:  Procedure Laterality Date  . CHONDROPLASTY Right 08/04/2018   Procedure: CHONDROPLASTY;  Surgeon: Salvatore Marvel, MD;  Location: [facility];  Service: Orthopedics;  Laterality: Right;  . KNEE ARTHROSCOPY WITH LATERAL MENISECTOMY Right 08/04/2018   Procedure: KNEE ARTHROSCOPY WITH LATERAL MENISECTOMY;  Surgeon: Salvatore Marvel, MD;  Location: [facility];  Service: Orthopedics;  Laterality: Right;  . KNEE ARTHROSCOPY WITH MEDIAL MENISECTOMY Right 08/04/2018   Procedure: KNEE ARTHROSCOPY WITH MEDIAL MENISECTOMY;  Surgeon: Salvatore Marvel, MD;  Location: [facility];  Service: Orthopedics;  Laterality: Right;  . KNEE ARTHROSCOPY WITH SUBCHONDROPLASTY Right 08/04/2018   Procedure: CHONDROPLASTY AND SUBCHONDROPLASTY MEDIAL TIBIAL PLATEAU;  Surgeon: Salvatore Marvel, MD;  Location:  SURGERY CENTER;  Service: Orthopedics;  Laterality: Right;       Home Medications    Prior to Admission medications   Medication Sig Start Date End Date Taking? Authorizing Provider  empagliflozin (JARDIANCE) 25 MG TABS tablet Take 25 mg by mouth daily. 09/03/18  Yes Johnson, Megan P, DO  glucose blood test strip 1 each by Other route as needed for other. Use as instructed   Yes Johnson, Megan P, DO  lisinopril-hydrochlorothiazide (ZESTORETIC) 20-12.5 MG tablet Take 1 tablet by mouth daily. 09/03/18  Yes Johnson, Megan P, DO  metFORMIN (GLUCOPHAGE-XR) 500 MG 24 hr tablet TAKE 2 TABLETS(1000 MG) BY MOUTH TWICE DAILY 09/03/18  Yes Johnson, Megan P, DO  rosuvastatin (CRESTOR) 20 MG tablet TAKE 1 TABLET(20 MG) BY MOUTH DAILY 09/03/18  Yes Johnson, Megan P, DO  amoxicillin-clavulanate (AUGMENTIN) 875-125 MG tablet Take 1 tablet by mouth every 12 (twelve) hours for 7 days. 01/30/19 02/06/19  Sudie Grumbling, NP  chlorpheniramine-HYDROcodone (TUSSIONEX PENNKINETIC ER) 10-8 MG/5ML SUER Take 5 mLs by mouth at  bedtime as needed for cough. 01/30/19   Sudie Grumbling, NP    Family History Family History  Problem Relation Age of Onset  . Diabetes Mother   . Diabetes Maternal Grandmother     Social History Social History   Tobacco Use  . Smoking status: Never Smoker  . Smokeless tobacco: Never Used  Substance Use Topics  . Alcohol use: No  . Drug use: No     Allergies   Patient has no known allergies.   Review of Systems Review of  Systems  Constitutional: Positive for chills, fatigue and fever. Negative for activity change and appetite change.  HENT: Positive for congestion, postnasal drip, sinus pressure, sinus pain and sore throat. Negative for ear discharge, ear pain, facial swelling, mouth sores, nosebleeds, rhinorrhea, sneezing, tinnitus and trouble swallowing.   Eyes: Negative for pain, discharge, redness and itching.  Respiratory: Positive for cough. Negative for chest tightness, shortness of breath and wheezing.   Gastrointestinal: Negative for abdominal pain, diarrhea, nausea and vomiting.  Musculoskeletal: Negative for arthralgias, myalgias, neck pain and neck stiffness.  Skin: Negative for color change, rash and wound.  Neurological: Positive for light-headedness and headaches. Negative for dizziness, tremors, seizures, syncope, weakness and numbness.  Hematological: Negative for adenopathy. Does not bruise/bleed easily.     Physical Exam Triage Vital Signs ED Triage Vitals  Enc Vitals Group     BP 01/30/19 1516 (!) 136/91     Pulse Rate 01/30/19 1516 80     Resp 01/30/19 1516 16     Temp 01/30/19 1516 98.5 F (36.9 C)     Temp Source 01/30/19 1516 Oral     SpO2 01/30/19 1516 99 %     Weight 01/30/19 1519 220 lb (99.8 kg)     Height 01/30/19 1519 6' (1.829 m)     Head Circumference --      Peak Flow --      Pain Score 01/30/19 1519 6     Pain Loc --      Pain Edu? --      Excl. in GC? --    No data found.  Updated Vital Signs BP (!) 136/91 (BP Location: Left Arm)   Pulse 80   Temp 98.5 F (36.9 C) (Oral)   Resp 16   Ht 6' (1.829 m)   Wt 220 lb (99.8 kg)   SpO2 99%   BMI 29.84 kg/m   Visual Acuity Right Eye Distance:   Left Eye Distance:   Bilateral Distance:    Right Eye Near:   Left Eye Near:    Bilateral Near:     Physical Exam Vitals signs and nursing note reviewed.  Constitutional:      General: He is awake. He is not in acute distress.    Appearance: He is  well-developed and well-groomed. He is ill-appearing.     Comments: Patient sitting comfortably on exam table in no acute distress but appears ill.   HENT:     Head: Normocephalic and atraumatic.     Right Ear: Hearing, tympanic membrane, ear canal and external ear normal.     Left Ear: Hearing, tympanic membrane, ear canal and external ear normal.     Nose: Congestion present.     Right Turbinates: Swollen.     Left Turbinates: Swollen.     Right Sinus: Maxillary sinus tenderness and frontal sinus tenderness present.     Left Sinus: Maxillary sinus tenderness and frontal sinus tenderness present.  Mouth/Throat:     Lips: Pink.     Mouth: Mucous membranes are moist.     Pharynx: Uvula midline. Oropharyngeal exudate (slight yellowish post nasal drainage present) and posterior oropharyngeal erythema present. No pharyngeal swelling or uvula swelling.  Eyes:     Extraocular Movements: Extraocular movements intact.     Conjunctiva/sclera: Conjunctivae normal.  Neck:     Musculoskeletal: Normal range of motion and neck supple. Muscular tenderness present. No neck rigidity.  Cardiovascular:     Rate and Rhythm: Normal rate and regular rhythm.     Heart sounds: Normal heart sounds. No murmur.  Pulmonary:     Effort: Pulmonary effort is normal. No respiratory distress.     Breath sounds: Normal breath sounds and air entry. No decreased air movement. No decreased breath sounds, wheezing, rhonchi or rales.  Musculoskeletal: Normal range of motion.  Lymphadenopathy:     Cervical: Cervical adenopathy present.     Right cervical: Superficial cervical adenopathy present.     Left cervical: Superficial cervical adenopathy present.  Skin:    General: Skin is warm and dry.     Capillary Refill: Capillary refill takes less than 2 seconds.     Findings: No rash.  Neurological:     General: No focal deficit present.     Mental Status: He is alert and oriented to person, place, and time.    Psychiatric:        Attention and Perception: Attention normal.        Mood and Affect: Affect is blunt.        Speech: Speech normal.        Behavior: Behavior is slowed. Behavior is cooperative.        Thought Content: Thought content normal.        Cognition and Memory: Cognition and memory normal.      UC Treatments / Results  Labs (all labs ordered are listed, but only abnormal results are displayed) Labs Reviewed  RAPID INFLUENZA A&B ANTIGENS ([facility] ONLY)    EKG None  Radiology No results found.  Procedures Procedures (including critical care time)  Medications Ordered in UC Medications - No data to display  Initial Impression / Assessment and Plan / UC Course  I have reviewed the triage vital signs and the nursing notes.  Pertinent labs & imaging results that were available during my care of the patient were reviewed by me and considered in my medical decision making (see chart for details).    Reviewed negative rapid influenza test results with patient and significant other. Discussed that he appears to have a sinus infection with drainage causing the cough. Recommend start Augmentin 875mg  twice a day as directed. Continue Mucinex as directed. May take Tussionex 1 teaspoon at night to help with cough. May continue Tylenol as needed for headache. Increase fluids to help loosen up mucus. Note written for work. Follow-up with his PCP in 4 to 5 days if not improving.   Final Clinical Impressions(s) / UC Diagnoses   Final diagnoses:  Acute non-recurrent pansinusitis  Cough     Discharge Instructions     Recommend start Augmentin 875mg  twice a day as directed. May continue Mucinex as needed. May take Tussionex 1 teaspoon at night to help with cough. May continue Tylenol as needed for headache. Follow-up with your PCP in 4 to 5 days if not improving.     ED Prescriptions    Medication Sig Dispense Auth. Provider   amoxicillin-clavulanate (AUGMENTIN) 875-125  MG  tablet Take 1 tablet by mouth every 12 (twelve) hours for 7 days. 14 tablet Danaye Sobh, Ali Lowe, NP   chlorpheniramine-HYDROcodone (TUSSIONEX PENNKINETIC ER) 10-8 MG/5ML SUER Take 5 mLs by mouth at bedtime as needed for cough. 70 mL Sudie Grumbling, NP     Controlled Substance Prescriptions [place] Controlled Substance Registry consulted? Yes, I have consulted the  Controlled Substances Registry for this patient, and feel the risk/benefit ratio today is favorable for proceeding with this prescription for a controlled substance. Last active Rx was for oxycodone in Sept 2019.    Sudie Grumbling, NP 01/31/19 1242

## 2019-01-30 NOTE — Discharge Instructions (Addendum)
Recommend start Augmentin 875mg  twice a day as directed. May continue Mucinex as needed. May take Tussionex 1 teaspoon at night to help with cough. May continue Tylenol as needed for headache. Follow-up with your PCP in 4 to 5 days if not improving.

## 2019-02-05 ENCOUNTER — Other Ambulatory Visit: Payer: Self-pay | Admitting: Family Medicine

## 2019-03-08 ENCOUNTER — Other Ambulatory Visit: Payer: Self-pay | Admitting: Family Medicine

## 2019-03-08 NOTE — Telephone Encounter (Signed)
Gave pt a 30 day supply ,pt needs an appt.

## 2019-04-04 ENCOUNTER — Other Ambulatory Visit: Payer: Self-pay | Admitting: Family Medicine

## 2019-04-04 NOTE — Telephone Encounter (Signed)
Attempt to call patient to scheduled follow up- left message tp call to schedule. 30 day courtesy refill given. Requested Prescriptions  Pending Prescriptions Disp Refills  . JARDIANCE 25 MG TABS tablet [Pharmacy Med Name: JARDIANCE 25MG TABLETS] 30 tablet 0    Sig: TAKE 1 TABLET BY MOUTH DAILY     Endocrinology:  Diabetes - SGLT2 Inhibitors Failed - 04/04/2019  1:51 PM      Failed - HBA1C is between 0 and 7.9 and within 180 days    HB A1C (BAYER DCA - WAIVED)  Date Value Ref Range Status  09/03/2018 7.9 (H) <7.0 % Final    Comment:                                          Diabetic Adult            <7.0                                       Healthy Adult        4.3 - 5.7                                                           (DCCT/NGSP) American Diabetes Association's Summary of Glycemic Recommendations for Adults with Diabetes: Hemoglobin A1c <7.0%. More stringent glycemic goals (A1c <6.0%) may further reduce complications at the cost of increased risk of hypoglycemia.          Failed - Valid encounter within last 6 months    Recent Outpatient Visits          7 months ago Type 2 diabetes mellitus with hyperglycemia, without long-term current use of insulin ([place])   [place], Megan P, DO   1 year ago Type 2 diabetes mellitus with hyperglycemia, without long-term current use of insulin ([place])   [place], Megan P, DO   1 year ago Type 2 diabetes mellitus with hyperglycemia, without long-term current use of insulin ([place])   [place], [place], DO   1 year ago Mixed hyperlipidemia   [facility] [place] [place], [place]   2 years ago Mixed hyperlipidemia   [place], [place], [place]             Passed - Cr in normal range and within 360 days    Creatinine, Ser  Date Value Ref Range Status  09/03/2018 0.88 0.76 - 1.27 mg/dL Final         Passed - LDL in normal  range and within 360 days    LDL Calculated  Date Value Ref Range Status  09/03/2018 61 0 - 99 mg/dL Final         Passed - eGFR in normal range and within 360 days    GFR calc Af Amer  Date Value Ref Range Status  09/03/2018 117 >59 mL/min/1.73 Final   GFR calc non Af Amer  Date Value Ref Range Status  09/03/2018 101 >59 mL/min/1.73 Final

## 2019-04-07 ENCOUNTER — Other Ambulatory Visit: Payer: Self-pay

## 2019-04-07 ENCOUNTER — Ambulatory Visit (INDEPENDENT_AMBULATORY_CARE_PROVIDER_SITE_OTHER): Payer: BLUE CROSS/BLUE SHIELD | Admitting: Family Medicine

## 2019-04-07 ENCOUNTER — Encounter: Payer: Self-pay | Admitting: Family Medicine

## 2019-04-07 VITALS — Wt 222.0 lb

## 2019-04-07 DIAGNOSIS — R319 Hematuria, unspecified: Secondary | ICD-10-CM | POA: Diagnosis not present

## 2019-04-07 DIAGNOSIS — E782 Mixed hyperlipidemia: Secondary | ICD-10-CM

## 2019-04-07 DIAGNOSIS — I129 Hypertensive chronic kidney disease with stage 1 through stage 4 chronic kidney disease, or unspecified chronic kidney disease: Secondary | ICD-10-CM

## 2019-04-07 DIAGNOSIS — E1165 Type 2 diabetes mellitus with hyperglycemia: Secondary | ICD-10-CM | POA: Diagnosis not present

## 2019-04-07 MED ORDER — EMPAGLIFLOZIN 25 MG PO TABS: 25.0000 mg | ORAL_TABLET | Freq: Every day | ORAL | 0 refills | Status: DC

## 2019-04-07 MED ORDER — ROSUVASTATIN CALCIUM 20 MG PO TABS: ORAL_TABLET | ORAL | 0 refills | Status: DC

## 2019-04-07 MED ORDER — METFORMIN HCL ER 500 MG PO TB24: ORAL_TABLET | ORAL | 0 refills | Status: DC

## 2019-04-07 MED ORDER — LISINOPRIL-HYDROCHLOROTHIAZIDE 20-12.5 MG PO TABS: 1.0000 | ORAL_TABLET | Freq: Every day | ORAL | 0 refills | Status: DC

## 2019-04-07 NOTE — Progress Notes (Signed)
Wt 222 lb (100.7 kg)   BMI 30.11 kg/m    Subjective:    Patient ID: Greg Wood, male    DOB: July 19, 1971, 48 y.o.   MRN: [id number]  HPI: Greg Wood is a 48 y.o. male  Chief Complaint  Patient presents with  . Diabetes  . Hypertension   DIABETES Hypoglycemic episodes:no Polydipsia/polyuria: no Visual disturbance: no Chest pain: no Paresthesias: no Glucose Monitoring: no  Accucheck frequency: Not Checking Taking Insulin?: no Blood Pressure Monitoring: not checking Retinal Examination: Not up to Date Foot Exam: Not up to Date Diabetic Education: Not Completed Pneumovax: Not up to Date Influenza: Not up to Date Aspirin: no  HYPERTENSION / HYPERLIPIDEMIA Satisfied with current treatment? yes Duration of hypertension: chronic BP monitoring frequency: not checking BP medication side effects: no Past BP meds: lisinopril-HCTZ Duration of hyperlipidemia: chronic Cholesterol medication side effects: no Cholesterol supplements: none Past cholesterol medications: rosuvastatin (crestor) Medication compliance: excellent compliance Aspirin: no Recent stressors: no Recurrent headaches: no Visual changes: no Palpitations: no Dyspnea: no Chest pain: no Lower extremity edema: no Dizzy/lightheaded: no  Relevant past medical, surgical, family and social history reviewed and updated as indicated. Interim medical history since our last visit reviewed. Allergies and medications reviewed and updated.  Review of Systems  Constitutional: Negative.   Respiratory: Negative.   Cardiovascular: Negative.   Gastrointestinal: Negative.   Psychiatric/Behavioral: Negative.     Per HPI unless specifically indicated above     Objective:    Wt 222 lb (100.7 kg)   BMI 30.11 kg/m   Wt Readings from Last 3 Encounters:  04/07/19 222 lb (100.7 kg)  01/30/19 220 lb (99.8 kg)  09/03/18 223 lb (101.2 kg)    Physical Exam Vitals signs and nursing note reviewed.  Constitutional:       General: He is not in acute distress.    Appearance: Normal appearance. He is not ill-appearing, toxic-appearing or diaphoretic.  HENT:     Head: Normocephalic and atraumatic.     Right Ear: External ear normal.     Left Ear: External ear normal.     Nose: Nose normal.     Mouth/Throat:     Mouth: Mucous membranes are moist.     Pharynx: Oropharynx is clear.  Eyes:     General: No scleral icterus.       Right eye: No discharge.        Left eye: No discharge.     Conjunctiva/sclera: Conjunctivae normal.     Pupils: Pupils are equal, round, and reactive to light.  Neck:     Musculoskeletal: Normal range of motion.  Pulmonary:     Effort: Pulmonary effort is normal. No respiratory distress.     Comments: Speaking in full sentences Musculoskeletal: Normal range of motion.  Skin:    Coloration: Skin is not jaundiced or pale.     Findings: No bruising, erythema, lesion or rash.  Neurological:     Mental Status: He is alert and oriented to person, place, and time. Mental status is at baseline.  Psychiatric:        Mood and Affect: Mood normal.        Behavior: Behavior normal.        Thought Content: Thought content normal.        Judgment: Judgment normal.     Results for orders placed or performed during the hospital encounter of 01/30/19  Rapid Influenza A&B Antigens [facility] only)  Result Value Ref Range  Influenza A ([facility]) NEGATIVE NEGATIVE   Influenza B ([facility]) NEGATIVE NEGATIVE      Assessment & Plan:   Problem List Items Addressed This Visit      Endocrine   Diabetes mellitus with hyperglycemia (HCC) - Primary    States that he has been feeling well. Will get him in for labs. Continue current regimen. Refills given today. Call with any concerns. Continue to monitor.       Relevant Medications   empagliflozin (JARDIANCE) 25 MG TABS tablet   lisinopril-hydrochlorothiazide (ZESTORETIC) 20-12.5 MG tablet   rosuvastatin (CRESTOR) 20 MG tablet   metFORMIN  (GLUCOPHAGE-XR) 500 MG 24 hr tablet   Other Relevant Orders   Bayer DCA Hb A1c Waived   Comprehensive metabolic panel   Microalbumin, Urine Waived     Genitourinary   Benign hypertensive renal disease    States that he has been feeling well. Will get him in for labs and vitals. Continue current regimen. Refills given today. Call with any concerns. Continue to monitor.       Relevant Orders   Comprehensive metabolic panel   Microalbumin, Urine Waived   TSH     Other   Blood in the urine    Will get him in for recheck on urine. Await results. Call with any concerns.       Relevant Orders   Comprehensive metabolic panel   PSA   UA/M w/rflx Culture, Routine   Hyperlipidemia    States that he has been feeling well. Will get him in for labs and vitals. Continue current regimen. Refills given today. Call with any concerns. Continue to monitor.       Relevant Medications   lisinopril-hydrochlorothiazide (ZESTORETIC) 20-12.5 MG tablet   rosuvastatin (CRESTOR) 20 MG tablet   Other Relevant Orders   Comprehensive metabolic panel   Lipid Panel w/o Chol/HDL Ratio       Follow up plan: Return ASAP labs and vitals, 3 months follow up.   . This visit was completed via FaceTime due to the restrictions of the COVID-19 pandemic. All issues as above were discussed and addressed. Physical exam was done as above through visual confirmation on FaceTime. If it was felt that the patient should be evaluated in the office, they were directed there. The patient verbally consented to this visit. . Location of the patient: home . Location of the provider: home . Those involved with this call:  . Provider: Olevia PerchesMegan Vartan Kerins, DO . CMA: Tiffany Reel, CMA . Front Desk/Registration: Harriet PhoJoliza Tangia Pinard  . Time spent on call: 25 minutes with patient face to face via video conference. More than 50% of this time was spent in counseling and coordination of care. 40 minutes total spent in review of patient's record  and preparation of their chart.

## 2019-04-07 NOTE — Assessment & Plan Note (Signed)
States that he has been feeling well. Will get him in for labs and vitals. Continue current regimen. Refills given today. Call with any concerns. Continue to monitor.  

## 2019-04-07 NOTE — Assessment & Plan Note (Signed)
Will get him in for recheck on urine. Await results. Call with any concerns.

## 2019-04-07 NOTE — Assessment & Plan Note (Signed)
States that he has been feeling well. Will get him in for labs and vitals. Continue current regimen. Refills given today. Call with any concerns. Continue to monitor.

## 2019-04-07 NOTE — Assessment & Plan Note (Signed)
States that he has been feeling well. Will get him in for labs. Continue current regimen. Refills given today. Call with any concerns. Continue to monitor.

## 2019-04-15 ENCOUNTER — Encounter: Payer: Self-pay | Admitting: Family Medicine

## 2019-04-15 ENCOUNTER — Telehealth: Payer: Self-pay | Admitting: Family Medicine

## 2019-04-15 NOTE — Telephone Encounter (Signed)
Called pt to schedule lab work, no answer, left voicemail.

## 2019-05-04 ENCOUNTER — Other Ambulatory Visit: Payer: Self-pay

## 2019-05-04 ENCOUNTER — Other Ambulatory Visit: Payer: BC Managed Care – PPO

## 2019-05-04 DIAGNOSIS — E1165 Type 2 diabetes mellitus with hyperglycemia: Secondary | ICD-10-CM

## 2019-05-04 DIAGNOSIS — R319 Hematuria, unspecified: Secondary | ICD-10-CM

## 2019-05-04 DIAGNOSIS — I129 Hypertensive chronic kidney disease with stage 1 through stage 4 chronic kidney disease, or unspecified chronic kidney disease: Secondary | ICD-10-CM

## 2019-05-04 DIAGNOSIS — E782 Mixed hyperlipidemia: Secondary | ICD-10-CM

## 2019-05-04 LAB — UA/M W/RFLX CULTURE, ROUTINE
Bilirubin, UA: NEGATIVE
Leukocytes,UA: NEGATIVE
Nitrite, UA: NEGATIVE
Protein,UA: NEGATIVE
RBC, UA: NEGATIVE
Specific Gravity, UA: 1.02 (ref 1.005–1.030)
Urobilinogen, Ur: 0.2 mg/dL (ref 0.2–1.0)
pH, UA: 5.5 (ref 5.0–7.5)

## 2019-05-04 LAB — MICROALBUMIN, URINE WAIVED
Creatinine, Urine Waived: 100 mg/dL (ref 10–300)
Microalb, Ur Waived: 30 mg/L — ABNORMAL HIGH (ref 0–19)
Microalb/Creat Ratio: <30 mg/g (ref <30)

## 2019-05-04 LAB — BAYER DCA HB A1C WAIVED: HB A1C (BAYER DCA - WAIVED): 6.6 % (ref <7.0)

## 2019-05-05 LAB — LIPID PANEL W/O CHOL/HDL RATIO
Cholesterol, Total: 131 mg/dL (ref 100–199)
HDL: 40 mg/dL (ref >39)
LDL Calculated: 63 mg/dL (ref 0–99)
Triglycerides: 141 mg/dL (ref 0–149)
VLDL Cholesterol Cal: 28 mg/dL (ref 5–40)

## 2019-05-05 LAB — COMPREHENSIVE METABOLIC PANEL
ALT: 17 IU/L (ref 0–44)
AST: 18 IU/L (ref 0–40)
Albumin/Globulin Ratio: 2 (ref 1.2–2.2)
Albumin: 4.8 g/dL (ref 4.0–5.0)
Alkaline Phosphatase: 55 IU/L (ref 39–117)
BUN/Creatinine Ratio: 14 (ref 9–20)
BUN: 14 mg/dL (ref 6–24)
Bilirubin Total: 0.4 mg/dL (ref 0.0–1.2)
CO2: 20 mmol/L (ref 20–29)
Calcium: 9.8 mg/dL (ref 8.7–10.2)
Chloride: 103 mmol/L (ref 96–106)
Creatinine, Ser: 1.01 mg/dL (ref 0.76–1.27)
GFR calc Af Amer: 100 mL/min/{1.73_m2} (ref >59)
GFR calc non Af Amer: 86 mL/min/{1.73_m2} (ref >59)
Globulin, Total: 2.4 g/dL (ref 1.5–4.5)
Glucose: 109 mg/dL — ABNORMAL HIGH (ref 65–99)
Potassium: 4.4 mmol/L (ref 3.5–5.2)
Sodium: 140 mmol/L (ref 134–144)
Total Protein: 7.2 g/dL (ref 6.0–8.5)

## 2019-05-05 LAB — TSH: TSH: 1.4 u[IU]/mL (ref 0.450–4.500)

## 2019-05-05 LAB — PSA: Prostate Specific Ag, Serum: 0.5 ng/mL (ref 0.0–4.0)

## 2019-07-01 ENCOUNTER — Other Ambulatory Visit: Payer: Self-pay | Admitting: Family Medicine

## 2019-07-11 ENCOUNTER — Ambulatory Visit: Payer: BLUE CROSS/BLUE SHIELD | Admitting: Family Medicine

## 2019-07-11 IMAGING — RF DG C-ARM 61-120 MIN
1 series · 2 of 2 positions shown · non-contrast
Comparison: None.

CLINICAL DATA: Right knee arthroscopy

EXAM:
RIGHT KNEE - 3 VIEW; DG C-ARM 61-120 MIN

[Series 1: run · 2 of 2 slices shown]
[im 1/2]
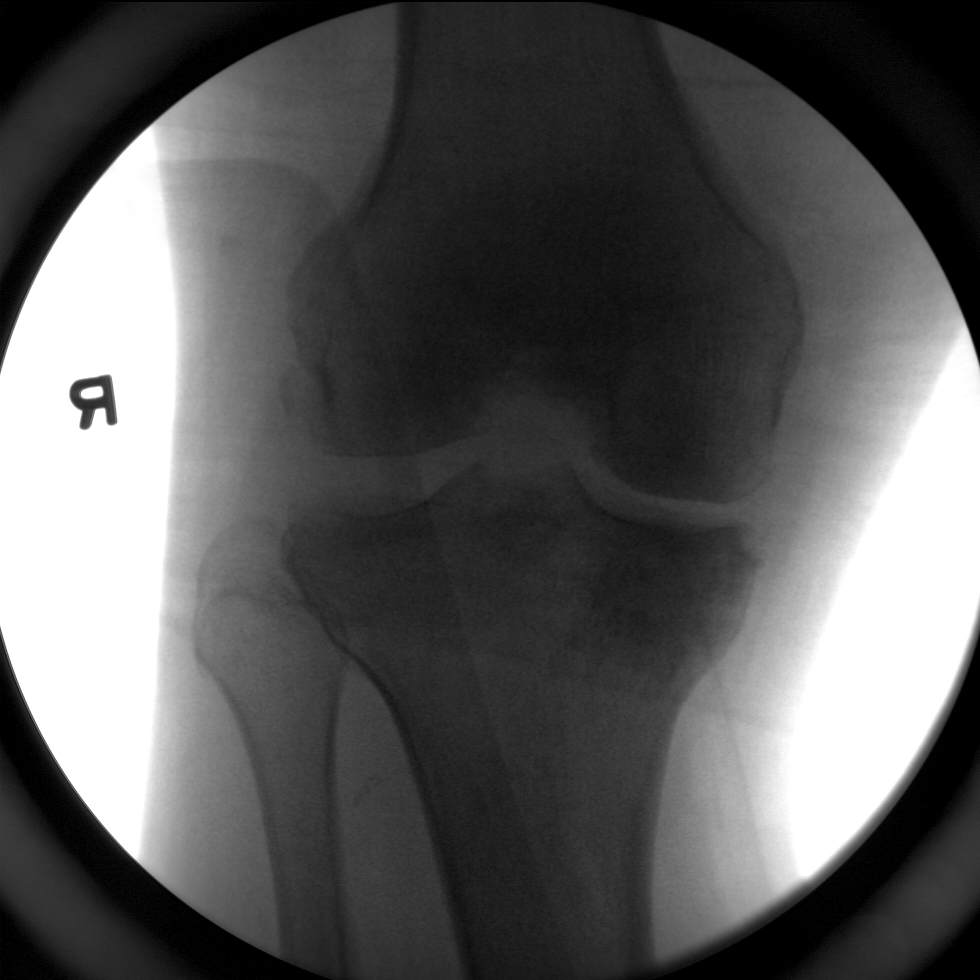
[im 2/2]
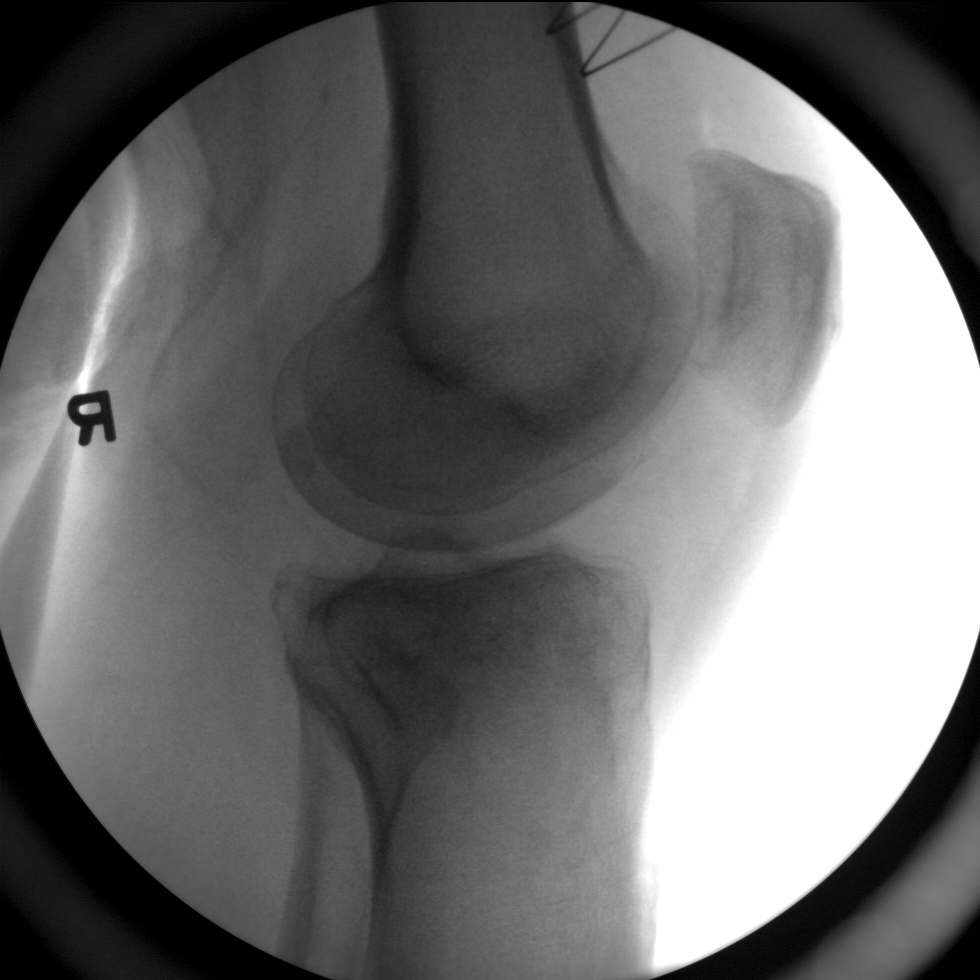

[2 of 2 positions shown; findings below may reference images not displayed]

FLUOROSCOPY TIME:  Radiation Exposure Index (as provided by the
fluoroscopic device): Not available

If the device does not provide the exposure index:

Fluoroscopy Time:  32 seconds

Number of Acquired Images:  2
FINDINGS: Right knee is well visualized. Some medial joint space narrowing is
noted. No acute bony or soft tissue abnormality is seen.
IMPRESSION: Mild medial joint space narrowing.

## 2019-07-22 ENCOUNTER — Ambulatory Visit: Payer: Self-pay | Admitting: Family Medicine

## 2019-07-22 NOTE — Progress Notes (Deleted)
There were no vitals taken for this visit.   Subjective:    Patient ID: Greg Wood, male    DOB: December 27, 1970, 48 y.o.   MRN: [id number]  HPI: Greg Wood is a 48 y.o. male  No chief complaint on file.  DIABETES Hypoglycemic episodes:{Blank single:19197::"yes","no"} Polydipsia/polyuria: {Blank single:19197::"yes","no"} Visual disturbance: {Blank single:19197::"yes","no"} Chest pain: {Blank single:19197::"yes","no"} Paresthesias: {Blank single:19197::"yes","no"} Glucose Monitoring: {Blank single:19197::"yes","no"}  Accucheck frequency: {Blank single:19197::"Not Checking","Daily","BID","TID"} Taking Insulin?: {Blank single:19197::"yes","no"} Blood Pressure Monitoring: {Blank single:19197::"not checking","rarely","daily","weekly","monthly","a few times a day","a few times a week","a few times a month"} Retinal Examination: {Blank single:19197::"Up to Date","Not up to Date"} Foot Exam: {Blank single:19197::"Up to Date","Not up to Date"} Diabetic Education: {Blank single:19197::"Completed","Not Completed"} Pneumovax: {Blank single:19197::"Up to Date","Not up to Date","unknown"} Influenza: {Blank single:19197::"Up to Date","Not up to Date","unknown"} Aspirin: {Blank single:19197::"yes","no"}  Relevant past medical, surgical, family and social history reviewed and updated as indicated. Interim medical history since our last visit reviewed. Allergies and medications reviewed and updated.  Review of Systems  Per HPI unless specifically indicated above     Objective:    There were no vitals taken for this visit.  Wt Readings from Last 3 Encounters:  04/07/19 222 lb (100.7 kg)  01/30/19 220 lb (99.8 kg)  09/03/18 223 lb (101.2 kg)    Physical Exam  Results for orders placed or performed in visit on 05/04/19  UA/M w/rflx Culture, Routine   Specimen: Urine   URINE  Result Value Ref Range   Specific Gravity, UA 1.020 1.005 - 1.030   pH, UA 5.5 5.0 - 7.5   Color, UA Yellow  Yellow   Appearance Ur Clear Clear   Leukocytes,UA Negative Negative   Protein,UA Negative Negative/Trace   Glucose, UA 3+ (A) Negative   Ketones, UA Trace (A) Negative   RBC, UA Negative Negative   Bilirubin, UA Negative Negative   Urobilinogen, Ur 0.2 0.2 - 1.0 mg/dL   Nitrite, UA Negative Negative  TSH  Result Value Ref Range   TSH 1.400 0.450 - 4.500 uIU/mL  PSA  Result Value Ref Range   Prostate Specific Ag, Serum 0.5 0.0 - 4.0 ng/mL  Microalbumin, Urine Waived  Result Value Ref Range   Microalb, Ur Waived 30 (H) 0 - 19 mg/L   Creatinine, Urine Waived 100 10 - 300 mg/dL   Microalb/Creat Ratio <30 <30 mg/g  Lipid Panel w/o Chol/HDL Ratio  Result Value Ref Range   Cholesterol, Total 131 100 - 199 mg/dL   Triglycerides 141 0 - 149 mg/dL   HDL 40 >39 mg/dL   VLDL Cholesterol Cal 28 5 - 40 mg/dL   LDL Calculated 63 0 - 99 mg/dL  Comprehensive metabolic panel  Result Value Ref Range   Glucose 109 (H) 65 - 99 mg/dL   BUN 14 6 - 24 mg/dL   Creatinine, Ser 1.01 0.76 - 1.27 mg/dL   GFR calc non Af Amer 86 >59 mL/min/1.73   GFR calc Af Amer 100 >59 mL/min/1.73   BUN/Creatinine Ratio 14 9 - 20   Sodium 140 134 - 144 mmol/L   Potassium 4.4 3.5 - 5.2 mmol/L   Chloride 103 96 - 106 mmol/L   CO2 20 20 - 29 mmol/L   Calcium 9.8 8.7 - 10.2 mg/dL   Total Protein 7.2 6.0 - 8.5 g/dL   Albumin 4.8 4.0 - 5.0 g/dL   Globulin, Total 2.4 1.5 - 4.5 g/dL   Albumin/Globulin Ratio 2.0 1.2 - 2.2   Bilirubin Total 0.4 0.0 -  1.2 mg/dL   Alkaline Phosphatase 55 39 - 117 IU/L   AST 18 0 - 40 IU/L   ALT 17 0 - 44 IU/L  Bayer DCA Hb A1c Waived  Result Value Ref Range   HB A1C (BAYER DCA - WAIVED) 6.6 <7.0 %      Assessment & Plan:   Problem List Items Addressed This Visit    None       Follow up plan: No follow-ups on file.

## 2019-08-05 ENCOUNTER — Other Ambulatory Visit: Payer: Self-pay | Admitting: Family Medicine

## 2019-08-05 NOTE — Telephone Encounter (Signed)
Requested medication (s) are due for refill today: yes  Requested medication (s) are on the active medication list: yes  Last refill:  07/03/2019  Future visit scheduled: no  Notes to clinic:  Review for refill   Requested Prescriptions  Pending Prescriptions Disp Refills   JARDIANCE 25 MG TABS tablet [Pharmacy Med Name: JARDIANCE 25MG TABLETS] 90 tablet 0    Sig: TAKE 1 TABLET BY MOUTH DAILY     Endocrinology:  Diabetes - SGLT2 Inhibitors Passed - 08/05/2019  7:05 AM      Passed - Cr in normal range and within 360 days    Creatinine, Ser  Date Value Ref Range Status  05/04/2019 1.01 0.76 - 1.27 mg/dL Final         Passed - LDL in normal range and within 360 days    LDL Calculated  Date Value Ref Range Status  05/04/2019 63 0 - 99 mg/dL Final         Passed - HBA1C is between 0 and 7.9 and within 180 days    HB A1C (BAYER DCA - WAIVED)  Date Value Ref Range Status  05/04/2019 6.6 <7.0 % Final    Comment:                                          Diabetic Adult            <7.0                                       Healthy Adult        4.3 - 5.7                                                           (DCCT/NGSP) American Diabetes Association's Summary of Glycemic Recommendations for Adults with Diabetes: Hemoglobin A1c <7.0%. More stringent glycemic goals (A1c <6.0%) may further reduce complications at the cost of increased risk of hypoglycemia.          Passed - eGFR in normal range and within 360 days    GFR calc Af Amer  Date Value Ref Range Status  05/04/2019 100 >59 mL/min/1.73 Final   GFR calc non Af Amer  Date Value Ref Range Status  05/04/2019 86 >59 mL/min/1.73 Final         Passed - Valid encounter within last 6 months    Recent Outpatient Visits          4 months ago Type 2 diabetes mellitus with hyperglycemia, without long-term current use of insulin ([place])   [facility], Megan P, DO   11 months ago Type 2 diabetes mellitus with  hyperglycemia, without long-term current use of insulin ([place])   [place], Megan P, DO   1 year ago Type 2 diabetes mellitus with hyperglycemia, without long-term current use of insulin ([place])   [place], Megan P, DO   1 year ago Type 2 diabetes mellitus with hyperglycemia, without long-term current use of insulin ([place])   [place], [place], DO   2 years  ago Mixed hyperlipidemia   [facility] Ro[facility] [place], [place]

## 2019-08-05 NOTE — Telephone Encounter (Signed)
Routing to provider  

## 2019-08-07 NOTE — Telephone Encounter (Signed)
Needs appointment

## 2019-08-08 NOTE — Telephone Encounter (Signed)
Called pt no answer, lvm to call back.

## 2019-08-09 ENCOUNTER — Encounter: Payer: Self-pay | Admitting: Family Medicine

## 2019-08-09 NOTE — Telephone Encounter (Signed)
Letter mailed

## 2019-08-18 ENCOUNTER — Encounter: Payer: Self-pay | Admitting: Family Medicine

## 2019-08-22 ENCOUNTER — Other Ambulatory Visit: Payer: Self-pay | Admitting: Family Medicine

## 2019-08-22 NOTE — Telephone Encounter (Signed)
Needs appointment

## 2019-08-22 NOTE — Telephone Encounter (Signed)
Patient would like a 90 day supply.

## 2019-08-22 NOTE — Telephone Encounter (Signed)
Requested medication (s) are due for refill today: yes  Requested medication (s) are on the active medication list: yes  Last refill:  07/21/2019  Future visit scheduled: no  Notes to clinic:  Requesting 90 day supply   Requested Prescriptions  Pending Prescriptions Disp Refills   metFORMIN (GLUCOPHAGE-XR) 500 MG 24 hr tablet [Pharmacy Med Name: METFORMIN ER '500MG'$  24HR TABS] 360 tablet 0    Sig: TAKE 2 TABLETS BY [place]     Endocrinology:  Diabetes - Biguanides Passed - 08/22/2019  7:44 AM      Passed - Cr in normal range and within 360 days    Creatinine, Ser  Date Value Ref Range Status  05/04/2019 1.01 0.76 - 1.27 mg/dL Final         Passed - HBA1C is between 0 and 7.9 and within 180 days    HB A1C (BAYER DCA - WAIVED)  Date Value Ref Range Status  05/04/2019 6.6 <7.0 % Final    Comment:                                          Diabetic Adult            <7.0                                       Healthy Adult        4.3 - 5.7                                                           (DCCT/NGSP) American Diabetes Association's Summary of Glycemic Recommendations for Adults with Diabetes: Hemoglobin A1c <7.0%. More stringent glycemic goals (A1c <6.0%) may further reduce complications at the cost of increased risk of hypoglycemia.          Passed - eGFR in normal range and within 360 days    GFR calc Af Amer  Date Value Ref Range Status  05/04/2019 100 >59 mL/min/1.73 Final   GFR calc non Af Amer  Date Value Ref Range Status  05/04/2019 86 >59 mL/min/1.73 Final         Passed - Valid encounter within last 6 months    Recent Outpatient Visits          4 months ago Type 2 diabetes mellitus with hyperglycemia, without long-term current use of insulin ([place])   [facility], Megan P, DO   11 months ago Type 2 diabetes mellitus with hyperglycemia, without long-term current use of insulin ([place])   [place], Megan P, DO    1 year ago Type 2 diabetes mellitus with hyperglycemia, without long-term current use of insulin ([place])   [place], Megan P, DO   2 years ago Type 2 diabetes mellitus with hyperglycemia, without long-term current use of insulin ([place])   [place], [place], DO   2 years ago Mixed hyperlipidemia   [facility] Ro[facility] [place], [place]

## 2019-08-22 NOTE — Telephone Encounter (Signed)
Routing to provider  

## 2019-08-22 NOTE — Telephone Encounter (Signed)
Requested medication (s) are due for refill today: yes  Requested medication (s) are on the active medication list: yes  Last refill:  08/22/2019  Future visit scheduled: no  Notes to clinic:  Requesting 90 day    Requested Prescriptions  Pending Prescriptions Disp Refills   metFORMIN (GLUCOPHAGE-XR) 500 MG 24 hr tablet [Pharmacy Med Name: METFORMIN ER 500MG 24HR TABS] 360 tablet     Sig: TAKE 2 TABLETS BY MOUTH TWICE DAILY     Endocrinology:  Diabetes - Biguanides Passed - 08/22/2019 12:54 PM      Passed - Cr in normal range and within 360 days    Creatinine, Ser  Date Value Ref Range Status  05/04/2019 1.01 0.76 - 1.27 mg/dL Final         Passed - HBA1C is between 0 and 7.9 and within 180 days    HB A1C (BAYER DCA - WAIVED)  Date Value Ref Range Status  05/04/2019 6.6 <7.0 % Final    Comment:                                          Diabetic Adult            <7.0                                       Healthy Adult        4.3 - 5.7                                                           (DCCT/NGSP) American Diabetes Association's Summary of Glycemic Recommendations for Adults with Diabetes: Hemoglobin A1c <7.0%. More stringent glycemic goals (A1c <6.0%) may further reduce complications at the cost of increased risk of hypoglycemia.          Passed - eGFR in normal range and within 360 days    GFR calc Af Amer  Date Value Ref Range Status  05/04/2019 100 >59 mL/min/1.73 Final   GFR calc non Af Amer  Date Value Ref Range Status  05/04/2019 86 >59 mL/min/1.73 Final         Passed - Valid encounter within last 6 months    Recent Outpatient Visits          4 months ago Type 2 diabetes mellitus with hyperglycemia, without long-term current use of insulin ([place])   [facility], Megan P, DO   11 months ago Type 2 diabetes mellitus with hyperglycemia, without long-term current use of insulin ([place])   [place], Megan P, DO   1  year ago Type 2 diabetes mellitus with hyperglycemia, without long-term current use of insulin ([place])   [place], Megan P, DO   2 years ago Type 2 diabetes mellitus with hyperglycemia, without long-term current use of insulin ([place])   [place], [place], DO   2 years ago Mixed hyperlipidemia   [facility] Ro[facility] [place], [place]

## 2019-08-22 NOTE — Telephone Encounter (Signed)
Called and left a message for patient to call the office to schedule an appt.

## 2019-09-03 DIAGNOSIS — Z23 Encounter for immunization: Secondary | ICD-10-CM | POA: Diagnosis not present

## 2019-09-04 ENCOUNTER — Other Ambulatory Visit: Payer: Self-pay | Admitting: Family Medicine

## 2019-09-08 ENCOUNTER — Other Ambulatory Visit: Payer: Self-pay | Admitting: Family Medicine

## 2019-09-27 ENCOUNTER — Other Ambulatory Visit: Payer: Self-pay | Admitting: Family Medicine

## 2019-09-27 NOTE — Telephone Encounter (Signed)
Requested medication (s) are due for refill today: yes  Requested medication (s) are on the active medication list: yes  Last refill: 08/22/2019  Future visit scheduled: no  Notes to clinic: refill /28/2020 states pt needs appt   Requested Prescriptions  Pending Prescriptions Disp Refills   metFORMIN (GLUCOPHAGE-XR) 500 MG 24 hr tablet [Pharmacy Med Name: METFORMIN ER '500MG'$  24HR TABS] 360 tablet     Sig: TAKE 2 TABLETS BY [place]     Endocrinology:  Diabetes - Biguanides Passed - 09/27/2019  3:23 AM      Passed - Cr in normal range and within 360 days    Creatinine, Ser  Date Value Ref Range Status  05/04/2019 1.01 0.76 - 1.27 mg/dL Final         Passed - HBA1C is between 0 and 7.9 and within 180 days    HB A1C (BAYER DCA - WAIVED)  Date Value Ref Range Status  05/04/2019 6.6 <7.0 % Final    Comment:                                          Diabetic Adult            <7.0                                       Healthy Adult        4.3 - 5.7                                                           (DCCT/NGSP) American Diabetes Association's Summary of Glycemic Recommendations for Adults with Diabetes: Hemoglobin A1c <7.0%. More stringent glycemic goals (A1c <6.0%) may further reduce complications at the cost of increased risk of hypoglycemia.          Passed - eGFR in normal range and within 360 days    GFR calc Af Amer  Date Value Ref Range Status  05/04/2019 100 >59 mL/min/1.73 Final   GFR calc non Af Amer  Date Value Ref Range Status  05/04/2019 86 >59 mL/min/1.73 Final         Passed - Valid encounter within last 6 months    Recent Outpatient Visits          5 months ago Type 2 diabetes mellitus with hyperglycemia, without long-term current use of insulin ([place])   [place], Megan P, DO   1 year ago Type 2 diabetes mellitus with hyperglycemia, without long-term current use of insulin ([place])   [place], Megan  P, DO   1 year ago Type 2 diabetes mellitus with hyperglycemia, without long-term current use of insulin ([place])   [place], Megan P, DO   2 years ago Type 2 diabetes mellitus with hyperglycemia, without long-term current use of insulin ([place])   [place], [place], DO   2 years ago Mixed hyperlipidemia   [place], [place], [place]

## 2019-09-27 NOTE — Telephone Encounter (Signed)
Routing to provider  

## 2019-09-29 NOTE — Telephone Encounter (Signed)
Called patient, Left detailed, VM for patient to return call to schedule follow up. (DPR reviewed.

## 2019-09-30 ENCOUNTER — Other Ambulatory Visit: Payer: Self-pay | Admitting: Family Medicine

## 2019-09-30 ENCOUNTER — Telehealth: Payer: Self-pay | Admitting: Family Medicine

## 2019-09-30 MED ORDER — METFORMIN HCL ER 500 MG PO TB24: ORAL_TABLET | ORAL | 0 refills | Status: DC

## 2019-09-30 NOTE — Telephone Encounter (Signed)
Called patient. Left VM for patient to return call to the office. 

## 2019-09-30 NOTE — Telephone Encounter (Signed)
Call pt to set up medication refill appt. He scheduled for 11/20 due to work schedule, however he states that he will be out of his metformin before then. Please advise.

## 2019-10-03 NOTE — Telephone Encounter (Signed)
Called patient. Left VM for patient to return call to the office. 

## 2019-10-09 ENCOUNTER — Other Ambulatory Visit: Payer: Self-pay | Admitting: Family Medicine

## 2019-10-11 NOTE — Telephone Encounter (Signed)
Patient scheduled for 10/14/19, per documentation

## 2019-10-14 ENCOUNTER — Ambulatory Visit: Payer: BC Managed Care – PPO | Admitting: Family Medicine

## 2019-10-14 ENCOUNTER — Other Ambulatory Visit: Payer: Self-pay

## 2019-10-14 ENCOUNTER — Encounter: Payer: Self-pay | Admitting: Family Medicine

## 2019-10-14 VITALS — BP 115/72 | HR 86 | Temp 99.2°F | Ht 72.0 in | Wt 221.0 lb

## 2019-10-14 DIAGNOSIS — I129 Hypertensive chronic kidney disease with stage 1 through stage 4 chronic kidney disease, or unspecified chronic kidney disease: Secondary | ICD-10-CM | POA: Diagnosis not present

## 2019-10-14 DIAGNOSIS — E782 Mixed hyperlipidemia: Secondary | ICD-10-CM

## 2019-10-14 DIAGNOSIS — Z125 Encounter for screening for malignant neoplasm of prostate: Secondary | ICD-10-CM

## 2019-10-14 DIAGNOSIS — E1165 Type 2 diabetes mellitus with hyperglycemia: Secondary | ICD-10-CM

## 2019-10-14 LAB — UA/M W/RFLX CULTURE, ROUTINE
Bilirubin, UA: NEGATIVE
Ketones, UA: NEGATIVE
Leukocytes,UA: NEGATIVE
Nitrite, UA: NEGATIVE
Protein,UA: NEGATIVE
RBC, UA: NEGATIVE
Specific Gravity, UA: 1.015 (ref 1.005–1.030)
Urobilinogen, Ur: 0.2 mg/dL (ref 0.2–1.0)
pH, UA: 5.5 (ref 5.0–7.5)

## 2019-10-14 LAB — MICROALBUMIN, URINE WAIVED
Creatinine, Urine Waived: 10 mg/dL (ref 10–300)
Microalb, Ur Waived: 10 mg/L (ref 0–19)
Microalb/Creat Ratio: <30 mg/g (ref <30)

## 2019-10-14 LAB — BAYER DCA HB A1C WAIVED: HB A1C (BAYER DCA - WAIVED): 6.4 % (ref <7.0)

## 2019-10-14 MED ORDER — ROSUVASTATIN CALCIUM 20 MG PO TABS: ORAL_TABLET | ORAL | 1 refills | Status: DC

## 2019-10-14 MED ORDER — LISINOPRIL-HYDROCHLOROTHIAZIDE 20-12.5 MG PO TABS: 1.0000 | ORAL_TABLET | Freq: Every day | ORAL | 1 refills | Status: DC

## 2019-10-14 MED ORDER — JARDIANCE 25 MG PO TABS: 25.0000 mg | ORAL_TABLET | Freq: Every day | ORAL | 1 refills | Status: DC

## 2019-10-14 MED ORDER — METFORMIN HCL ER 500 MG PO TB24: ORAL_TABLET | ORAL | 1 refills | Status: DC

## 2019-10-14 NOTE — Assessment & Plan Note (Signed)
Under good control with A1c of 6.4- continue current regimen. Continue to monitor. Call with any concerns. Refills given today.

## 2019-10-14 NOTE — Assessment & Plan Note (Signed)
Under good control on current regimen. Continue current regimen. Continue to monitor. Call with any concerns. Refills given. Labs drawn today.   

## 2019-10-14 NOTE — Assessment & Plan Note (Signed)
Under good control on current regimen. Continue current regimen. Continue to monitor. Call with any concerns. Refills given. Labs checked today.  

## 2019-10-14 NOTE — Progress Notes (Signed)
BP 115/72   Pulse 86   Temp 99.2 F (37.3 C) (Oral)   Ht 6' (1.829 m)   Wt 221 lb (100.2 kg)   SpO2 98%   BMI 29.97 kg/m    Subjective:    Patient ID: Greg Wood, male    DOB: February 26, 1971, 48 y.o.   MRN: [id number]  HPI: Greg Wood is a 48 y.o. male  Chief Complaint  Patient presents with  . Diabetes  . Hypertension  . Hyperlipidemia   DIABETES Hypoglycemic episodes:no Polydipsia/polyuria: no Visual disturbance: no Chest pain: no Paresthesias: no Glucose Monitoring: no  Accucheck frequency: Not Checking  Fasting glucose: Taking Insulin?: no Blood Pressure Monitoring: not checking Retinal Examination: Not up to Date Foot Exam: done today Diabetic Education: Completed Pneumovax: Up to Date Influenza: Up to Date Aspirin: yes  HYPERTENSION / HYPERLIPIDEMIA- has been taking his cholesterol medicine about 1/2 the time Satisfied with current treatment? yes Duration of hypertension: chronic BP monitoring frequency: not checking BP medication side effects: no Past BP meds: lisinopril-HCTZ Duration of hyperlipidemia: chronic Cholesterol medication side effects: no Cholesterol supplements: none Past cholesterol medications: crestor Medication compliance: excellent compliance Aspirin: no Recent stressors: no Recurrent headaches: no Visual changes: no Palpitations: no Dyspnea: no Chest pain: no Lower extremity edema: no Dizzy/lightheaded: no   Relevant past medical, surgical, family and social history reviewed and updated as indicated. Interim medical history since our last visit reviewed. Allergies and medications reviewed and updated.  Review of Systems  Constitutional: Negative.   Respiratory: Negative.   Cardiovascular: Negative.   Gastrointestinal: Negative.   Musculoskeletal: Negative.   Psychiatric/Behavioral: Negative.     Per HPI unless specifically indicated above     Objective:    BP 115/72   Pulse 86   Temp 99.2 F (37.3 C)  (Oral)   Ht 6' (1.829 m)   Wt 221 lb (100.2 kg)   SpO2 98%   BMI 29.97 kg/m   Wt Readings from Last 3 Encounters:  10/14/19 221 lb (100.2 kg)  04/07/19 222 lb (100.7 kg)  01/30/19 220 lb (99.8 kg)    Physical Exam  Results for orders placed or performed in visit on 05/04/19  UA/M w/rflx Culture, Routine   Specimen: Urine   URINE  Result Value Ref Range   Specific Gravity, UA 1.020 1.005 - 1.030   pH, UA 5.5 5.0 - 7.5   Color, UA Yellow Yellow   Appearance Ur Clear Clear   Leukocytes,UA Negative Negative   Protein,UA Negative Negative/Trace   Glucose, UA 3+ (A) Negative   Ketones, UA Trace (A) Negative   RBC, UA Negative Negative   Bilirubin, UA Negative Negative   Urobilinogen, Ur 0.2 0.2 - 1.0 mg/dL   Nitrite, UA Negative Negative  TSH  Result Value Ref Range   TSH 1.400 0.450 - 4.500 uIU/mL  PSA  Result Value Ref Range   Prostate Specific Ag, Serum 0.5 0.0 - 4.0 ng/mL  Microalbumin, Urine Waived  Result Value Ref Range   Microalb, Ur Waived 30 (H) 0 - 19 mg/L   Creatinine, Urine Waived 100 10 - 300 mg/dL   Microalb/Creat Ratio <30 <30 mg/g  Lipid Panel w/o Chol/HDL Ratio  Result Value Ref Range   Cholesterol, Total 131 100 - 199 mg/dL   Triglycerides 141 0 - 149 mg/dL   HDL 40 >39 mg/dL   VLDL Cholesterol Cal 28 5 - 40 mg/dL   LDL Calculated 63 0 - 99 mg/dL  Comprehensive  metabolic panel  Result Value Ref Range   Glucose 109 (H) 65 - 99 mg/dL   BUN 14 6 - 24 mg/dL   Creatinine, Ser 1.01 0.76 - 1.27 mg/dL   GFR calc non Af Amer 86 >59 mL/min/1.73   GFR calc Af Amer 100 >59 mL/min/1.73   BUN/Creatinine Ratio 14 9 - 20   Sodium 140 134 - 144 mmol/L   Potassium 4.4 3.5 - 5.2 mmol/L   Chloride 103 96 - 106 mmol/L   CO2 20 20 - 29 mmol/L   Calcium 9.8 8.7 - 10.2 mg/dL   Total Protein 7.2 6.0 - 8.5 g/dL   Albumin 4.8 4.0 - 5.0 g/dL   Globulin, Total 2.4 1.5 - 4.5 g/dL   Albumin/Globulin Ratio 2.0 1.2 - 2.2   Bilirubin Total 0.4 0.0 - 1.2 mg/dL   Alkaline  Phosphatase 55 39 - 117 IU/L   AST 18 0 - 40 IU/L   ALT 17 0 - 44 IU/L  Bayer DCA Hb A1c Waived  Result Value Ref Range   HB A1C (BAYER DCA - WAIVED) 6.6 <7.0 %      Assessment & Plan:   Problem List Items Addressed This Visit      Endocrine   Diabetes mellitus with hyperglycemia (HCC) - Primary    Under good control with A1c of 6.4- continue current regimen. Continue to monitor. Call with any concerns. Refills given today.      Relevant Medications   empagliflozin (JARDIANCE) 25 MG TABS tablet   lisinopril-hydrochlorothiazide (ZESTORETIC) 20-12.5 MG tablet   metFORMIN (GLUCOPHAGE-XR) 500 MG 24 hr tablet   rosuvastatin (CRESTOR) 20 MG tablet   Other Relevant Orders   Bayer DCA Hb A1c Waived   CBC with Differential OUT   Comp Met (CMET)   Microalbumin, Urine Waived   UA/M w/rflx Culture, Routine     Genitourinary   Benign hypertensive renal disease    Under good control on current regimen. Continue current regimen. Continue to monitor. Call with any concerns. Refills given. Labs checked today.        Relevant Orders   CBC with Differential OUT   Comp Met (CMET)   Microalbumin, Urine Waived   TSH   UA/M w/rflx Culture, Routine     Other   Hyperlipidemia    Under good control on current regimen. Continue current regimen. Continue to monitor. Call with any concerns. Refills given. Labs drawn today.       Relevant Medications   lisinopril-hydrochlorothiazide (ZESTORETIC) 20-12.5 MG tablet   rosuvastatin (CRESTOR) 20 MG tablet   Other Relevant Orders   CBC with Differential OUT   Comp Met (CMET)   Lipid Panel w/o Chol/HDL Ratio OUT    Other Visit Diagnoses    Screening for prostate cancer       Labs drawn today.   Relevant Orders   PSA       Follow up plan: Return in about 6 months (around 04/12/2020) for Physical.      

## 2019-10-15 LAB — COMPREHENSIVE METABOLIC PANEL
ALT: 18 IU/L (ref 0–44)
AST: 18 IU/L (ref 0–40)
Albumin/Globulin Ratio: 1.7 (ref 1.2–2.2)
Albumin: 4.6 g/dL (ref 4.0–5.0)
Alkaline Phosphatase: 58 IU/L (ref 39–117)
BUN/Creatinine Ratio: 9 (ref 9–20)
BUN: 10 mg/dL (ref 6–24)
Bilirubin Total: 0.4 mg/dL (ref 0.0–1.2)
CO2: 24 mmol/L (ref 20–29)
Calcium: 9.9 mg/dL (ref 8.7–10.2)
Chloride: 100 mmol/L (ref 96–106)
Creatinine, Ser: 1.14 mg/dL (ref 0.76–1.27)
GFR calc Af Amer: 86 mL/min/{1.73_m2} (ref >59)
GFR calc non Af Amer: 75 mL/min/{1.73_m2} (ref >59)
Globulin, Total: 2.7 g/dL (ref 1.5–4.5)
Glucose: 216 mg/dL — ABNORMAL HIGH (ref 65–99)
Potassium: 4.2 mmol/L (ref 3.5–5.2)
Sodium: 143 mmol/L (ref 134–144)
Total Protein: 7.3 g/dL (ref 6.0–8.5)

## 2019-10-15 LAB — CBC WITH DIFFERENTIAL/PLATELET
Basophils Absolute: 0.1 10*3/uL (ref 0.0–0.2)
Basos: 1 %
EOS (ABSOLUTE): 0.1 10*3/uL (ref 0.0–0.4)
Eos: 2 %
Hematocrit: 44.4 % (ref 37.5–51.0)
Hemoglobin: 14.6 g/dL (ref 13.0–17.7)
Immature Grans (Abs): 0 10*3/uL (ref 0.0–0.1)
Immature Granulocytes: 0 %
Lymphocytes Absolute: 1.7 10*3/uL (ref 0.7–3.1)
Lymphs: 42 %
MCH: 27.7 pg (ref 26.6–33.0)
MCHC: 32.9 g/dL (ref 31.5–35.7)
MCV: 84 fL (ref 79–97)
Monocytes Absolute: 0.4 10*3/uL (ref 0.1–0.9)
Monocytes: 11 %
Neutrophils Absolute: 1.7 10*3/uL (ref 1.4–7.0)
Neutrophils: 44 %
Platelets: 200 10*3/uL (ref 150–450)
RBC: 5.28 x10E6/uL (ref 4.14–5.80)
RDW: 12.7 % (ref 11.6–15.4)
WBC: 4 10*3/uL (ref 3.4–10.8)

## 2019-10-15 LAB — LIPID PANEL W/O CHOL/HDL RATIO
Cholesterol, Total: 150 mg/dL (ref 100–199)
HDL: 36 mg/dL — ABNORMAL LOW (ref >39)
LDL Chol Calc (NIH): 91 mg/dL (ref 0–99)
Triglycerides: 127 mg/dL (ref 0–149)
VLDL Cholesterol Cal: 23 mg/dL (ref 5–40)

## 2019-10-15 LAB — TSH: TSH: 0.562 u[IU]/mL (ref 0.450–4.500)

## 2019-10-15 LAB — PSA: Prostate Specific Ag, Serum: 0.4 ng/mL (ref 0.0–4.0)

## 2019-11-01 ENCOUNTER — Other Ambulatory Visit: Payer: Self-pay | Admitting: Family Medicine

## 2019-11-01 NOTE — Telephone Encounter (Signed)
Requested Prescriptions  Pending Prescriptions Disp Refills  . rosuvastatin (CRESTOR) 20 MG tablet [Pharmacy Med Name: ROSUVASTATIN 20MG TABLETS] 90 tablet 1    Sig: TAKE 1 TABLET(20 MG) BY MOUTH DAILY     Cardiovascular:  Antilipid - Statins Failed - 11/01/2019 10:40 AM      Failed - HDL in normal range and within 360 days    HDL  Date Value Ref Range Status  10/14/2019 36 (L) >39 mg/dL Final         Passed - Total Cholesterol in normal range and within 360 days    Cholesterol, Total  Date Value Ref Range Status  10/14/2019 150 100 - 199 mg/dL Final   Cholesterol [place], Waived  Date Value Ref Range Status  06/03/2017 WILL FOLLOW  Preliminary         Passed - LDL in normal range and within 360 days    LDL Chol Calc (NIH)  Date Value Ref Range Status  10/14/2019 91 0 - 99 mg/dL Final         Passed - Triglycerides in normal range and within 360 days    Triglycerides  Date Value Ref Range Status  10/14/2019 127 0 - 149 mg/dL Final   Triglycerides Piccolo,Waived  Date Value Ref Range Status  06/03/2017 WILL FOLLOW  Preliminary         Passed - Patient is not pregnant      Passed - Valid encounter within last 12 months    Recent Outpatient Visits          2 weeks ago Type 2 diabetes mellitus with hyperglycemia, without long-term current use of insulin ([place])   [place], Megan P, DO   6 months ago Type 2 diabetes mellitus with hyperglycemia, without long-term current use of insulin ([place])   [place], Megan P, DO   1 year ago Type 2 diabetes mellitus with hyperglycemia, without long-term current use of insulin ([place])   [place], Megan P, DO   1 year ago Type 2 diabetes mellitus with hyperglycemia, without long-term current use of insulin ([place])   [place], Megan P, DO   2 years ago Type 2 diabetes mellitus with hyperglycemia, without long-term current use of insulin ([place])   [facility] [place], [place], DO      Future Appointments            In 5 months [place], Megan P, DO [facility]           . metFORMIN (GLUCOPHAGE-XR) 500 MG 24 hr tablet [Pharmacy Med Name: METFORMIN ER 500MG 24HR TABS] 120 tablet     Sig: TAKE 2 TABLETS BY MOUTH TWICE DAILY     Endocrinology:  Diabetes - Biguanides Passed - 11/01/2019 10:40 AM      Passed - Cr in normal range and within 360 days    Creatinine, Ser  Date Value Ref Range Status  10/14/2019 1.14 0.76 - 1.27 mg/dL Final         Passed - HBA1C is between 0 and 7.9 and within 180 days    HB A1C (BAYER DCA - WAIVED)  Date Value Ref Range Status  10/14/2019 6.4 <7.0 % Final    Comment:  Diabetic Adult            <7.0                                       Healthy Adult        4.3 - 5.7                                                           (DCCT/NGSP) American Diabetes Association's Summary of Glycemic Recommendations for Adults with Diabetes: Hemoglobin A1c <7.0%. More stringent glycemic goals (A1c <6.0%) may further reduce complications at the cost of increased risk of hypoglycemia.          Passed - eGFR in normal range and within 360 days    GFR calc Af Amer  Date Value Ref Range Status  10/14/2019 86 >59 mL/min/1.73 Final   GFR calc non Af Amer  Date Value Ref Range Status  10/14/2019 75 >59 mL/min/1.73 Final         Passed - Valid encounter within last 6 months    Recent Outpatient Visits          2 weeks ago Type 2 diabetes mellitus with hyperglycemia, without long-term current use of insulin ([place])   [facility], Megan P, DO   6 months ago Type 2 diabetes mellitus with hyperglycemia, without long-term current use of insulin ([place])   [place], Megan P, DO   1 year ago Type 2 diabetes mellitus with hyperglycemia, without long-term current use of insulin ([place])   [place], Megan P, DO   1 year ago Type 2 diabetes mellitus with hyperglycemia, without long-term current use of insulin ([place])   [place], Megan P, DO   2 years ago Type 2 diabetes mellitus with hyperglycemia, without long-term current use of insulin ([place])   [place], [place], DO      Future Appointments            In 5 months Wynetta Emery, Barb Merino, DO [facility], [facility]

## 2019-11-19 ENCOUNTER — Emergency Department
Admission: EM | Admit: 2019-11-19 | Discharge: 2019-11-19 | Disposition: A | Discharge status: Home / Self Care | Payer: BC Managed Care – PPO | Attending: Emergency Medicine | Admitting: Emergency Medicine

## 2019-11-19 ENCOUNTER — Other Ambulatory Visit: Payer: Self-pay

## 2019-11-19 ENCOUNTER — Encounter: Payer: Self-pay | Admitting: Emergency Medicine

## 2019-11-19 DIAGNOSIS — Z79899 Other long term (current) drug therapy: Secondary | ICD-10-CM | POA: Insufficient documentation

## 2019-11-19 DIAGNOSIS — E119 Type 2 diabetes mellitus without complications: Secondary | ICD-10-CM | POA: Diagnosis not present

## 2019-11-19 DIAGNOSIS — K047 Periapical abscess without sinus: Secondary | ICD-10-CM | POA: Insufficient documentation

## 2019-11-19 DIAGNOSIS — I10 Essential (primary) hypertension: Secondary | ICD-10-CM | POA: Insufficient documentation

## 2019-11-19 DIAGNOSIS — K0889 Other specified disorders of teeth and supporting structures: Secondary | ICD-10-CM | POA: Diagnosis present

## 2019-11-19 DIAGNOSIS — Z7984 Long term (current) use of oral hypoglycemic drugs: Secondary | ICD-10-CM | POA: Diagnosis not present

## 2019-11-19 MED ORDER — AMOXICILLIN 875 MG PO TABS: 875.0000 mg | ORAL_TABLET | Freq: Two times a day (BID) | ORAL | 0 refills | Status: DC

## 2019-11-19 MED ORDER — TRAMADOL HCL 50 MG PO TABS: 50.0000 mg | ORAL_TABLET | Freq: Four times a day (QID) | ORAL | 0 refills | Status: DC | PRN

## 2019-11-19 NOTE — ED Notes (Signed)
C/o dental pain since weds.

## 2019-11-19 NOTE — Discharge Instructions (Addendum)
Follow-up with a dentist that is approved by your insurance or one of the following dental clinics [place]  [place] [facility] and [place] [facility] [phone])  [facility] [phone])  [place] [phone])  [place] [phone])  [facility] [phone]) This clinic caters to the indigent population and is on a lottery system. Location: [employer] [facility], [place], [place], [place] Clinic Hours: Wednesdays from 6pm - 9pm, patients seen by a lottery system. For dates, call or go to [email or web address] Services: Cleanings, fillings and simple extractions. Payment Options: DENTAL WORK IS FREE OF CHARGE. Bring proof of income or support. Best way to get seen: Arrive at 5:15 pm - this is a lottery, NOT first come/first serve, so arriving earlier will not increase your chances of being seen.     [facility] [phone] Select option 1 for emergencies   Location: [facility], [place], [address], [place] Clinic Hours: No walk-ins accepted - call the day before to schedule an appointment. Check in times are 9:30 am and 1:30 pm. Services: Simple extractions, temporary fillings, pulpectomy/pulp debridement, uncomplicated abscess drainage. Payment Options: PAYMENT IS DUE AT THE TIME OF SERVICE.  Fee is usually $100-200, additional surgical procedures (e.g. abscess drainage) may be extra. Cash, checks, Visa/MasterCard accepted.  Can file Medicaid if patient is covered for dental - patient should call case worker to check. No discount for [facility] patients. Best way to get seen: MUST call the day before and get onto the schedule. Can usually be seen  the next 1-2 days. No walk-ins accepted.     [facility] [phone]   Location: [place], [place] Clinic Hours: M, W, Th, F 8am or 1:30pm, Tues 9a or 1:30 - first come/first served. Services: Simple extractions, temporary fillings, uncomplicated abscess drainage.  You do not need to be an [facility] resident. Payment Options: PAYMENT IS DUE AT THE TIME OF SERVICE. Dental insurance, otherwise sliding scale - bring proof of income or support. Depending on income and treatment needed, cost is usually $50-200. Best way to get seen: Arrive early as it is first come/first served.     [facility] [phone]   Location: [place] [facility] Hours: Mon-Thu 8a-5p Services: Most basic dental services including extractions and fillings. Payment Options: PAYMENT IS DUE AT THE TIME OF SERVICE. Sliding scale, up to 50% off - bring proof if income or support. Medicaid with dental option accepted. Best way to get seen: Call to schedule an appointment, can usually be seen within 2 weeks OR they will try to see walk-ins - show up at [place] or 2p (you may have to wait).     [facility] RESIDENTS ONLY   Location: [facility], [place] [address], [place], [place] [address] Clinic Hours: By appointment only. Monday - Thursday 8am-5pm, Friday 8am-12pm Services: Cleanings, fillings, extractions. Payment Options: PAYMENT IS DUE AT THE TIME OF SERVICE. Cash, Visa or MasterCard. Sliding scale - $30 minimum per service. Best way to get seen: Come in to office, complete packet and make an appointment - need proof of income or support monies for each household member and proof of [facility] residence. Usually takes about a month to get  in.     [facility] [phone]   Location: [place].,  [place] Clinic Hours: Walk-in Urgent Care Dental Services are offered Monday-Friday mornings only. The numbers of emergencies accepted daily is limited to the number of providers available. Maximum 15 - Mondays, Wednesdays & Thursdays Maximum 10 - Tuesdays & Fridays Services: You do not need to be a [facility] resident to be seen for a dental emergency. Emergencies are defined as pain, swelling, abnormal bleeding, or dental trauma. Walkins will receive x-rays if needed. NOTE: Dental cleaning is not an emergency. Payment Options: PAYMENT IS DUE AT THE TIME OF SERVICE. Minimum co-pay is $40.00 for uninsured patients. Minimum co-pay is $3.00 for Medicaid with dental coverage. Dental Insurance is accepted and must be presented at time of visit. Medicare does not cover dental. Forms of payment: Cash, credit card, checks. Best way to get seen: If not previously registered with the clinic, walk-in dental registration begins at 7:15 am and is on a first come/first serve basis. If previously registered with the clinic, call to make an appointment.     [facility] [place] ONLY   Location: [address] [place], [place] Clinic Hours: Mon-Thu 10a-2p Services: Extractions only! Payment Options: FREE (donations accepted) - bring proof of income or support Best way to get seen: Call and schedule an appointment OR come at 8am on the 1st Monday of every month (except for holidays) when it is first come/first served.     [facility] [phone]   Location: [place], [place] Clinic Hours: Friday mornings Services, Payment Options, Best way to get seen: Call for info

## 2019-11-19 NOTE — ED Triage Notes (Signed)
Upper L dental pain x 3 days.

## 2019-11-19 NOTE — ED Provider Notes (Signed)
[facility] Emergency Department Provider Note  ____________________________________________   First MD Initiated Contact with Patient 11/19/19 1145     (approximate)  I have reviewed the triage vital signs and the nursing notes.   HISTORY  Chief Complaint Dental Pain    HPI Greg Wood is a 48 y.o. male presents emergency department complaint dental pain.  Symptoms for 3 days.  Took over-the-counter meds without any relief.  Has also been applying compresses.  Has not seen a dentist in quite a while.  No fever or chills.    Past Medical History:  Diagnosis Date  . Acute bronchitis   . Acute lateral meniscus tear of right knee   . Acute medial meniscus tear of right knee   . Allergic rhinitis due to pollen   . Blood in the urine   . Diabetes mellitus without complication (HCC)   . Hypercalcemia    Resolved on recheck  . Hyperlipidemia   . Hypertension   . Right medial tibial plateau fracture, closed, initial encounter     Patient Active Problem List   Diagnosis Date Noted  . Acute medial meniscus tear of right knee   . Acute lateral meniscus tear of right knee   . Right medial tibial plateau fracture, closed, initial encounter   . Noncompliance 01/28/2016  . Blood in the urine   . Allergic rhinitis due to pollen   . Diabetes mellitus with hyperglycemia (HCC)   . Hyperlipidemia   . Benign hypertensive renal disease     Past Surgical History:  Procedure Laterality Date  . CHONDROPLASTY Right 08/04/2018   Procedure: CHONDROPLASTY;  Surgeon: Salvatore MarvelWainer, Robert, MD;  Location: [facility];  Service: Orthopedics;  Laterality: Right;  . KNEE ARTHROSCOPY WITH LATERAL MENISECTOMY Right 08/04/2018   Procedure: KNEE ARTHROSCOPY WITH LATERAL MENISECTOMY;  Surgeon: Salvatore MarvelWainer, Robert, MD;  Location: [facility];  Service: Orthopedics;  Laterality: Right;  . KNEE ARTHROSCOPY WITH MEDIAL MENISECTOMY Right 08/04/2018   Procedure:  KNEE ARTHROSCOPY WITH MEDIAL MENISECTOMY;  Surgeon: Salvatore MarvelWainer, Robert, MD;  Location: [facility];  Service: Orthopedics;  Laterality: Right;  . KNEE ARTHROSCOPY WITH SUBCHONDROPLASTY Right 08/04/2018   Procedure: CHONDROPLASTY AND SUBCHONDROPLASTY MEDIAL TIBIAL PLATEAU;  Surgeon: Salvatore MarvelWainer, Robert, MD;  Location: [facility];  Service: Orthopedics;  Laterality: Right;    Prior to Admission medications   Medication Sig Start Date End Date Taking? Authorizing Provider  amoxicillin (AMOXIL) 875 MG tablet Take 1 tablet (875 mg total) by mouth 2 (two) times daily. 11/19/19   [place] Troia, Roselyn BeringSusan W, PA-C  empagliflozin (JARDIANCE) 25 MG TABS tablet Take 25 mg by mouth daily. 10/14/19   Johnson, Megan P, DO  glucose blood test strip 1 each by Other route as needed for other. Use as instructed    Laural BenesJohnson, Megan P, DO  lisinopril-hydrochlorothiazide (ZESTORETIC) 20-12.5 MG tablet Take 1 tablet by mouth daily. 10/14/19   Johnson, Megan P, DO  metFORMIN (GLUCOPHAGE-XR) 500 MG 24 hr tablet TAKE 2 TABLETS BY MOUTH TWICE DAILY 11/01/19   Johnson, Megan P, DO  rosuvastatin (CRESTOR) 20 MG tablet TAKE 1 TABLET(20 MG) BY MOUTH DAILY 11/01/19   Johnson, Megan P, DO  traMADol (ULTRAM) 50 MG tablet Take 1 tablet (50 mg total) by mouth every 6 (six) hours as needed. 11/19/19   Faythe GheeFisher, Tyus Kallam W, PA-C    Allergies Patient has no known allergies.  Family History  Problem Relation Age of Onset  . Diabetes Mother   .  Diabetes Maternal Grandmother     Social History Social History   Tobacco Use  . Smoking status: Never Smoker  . Smokeless tobacco: Never Used  Substance Use Topics  . Alcohol use: No  . Drug use: No    Review of Systems  Constitutional: No fever/chills Eyes: No visual changes. ENT: No sore throat.  Positive dental pain Respiratory: Denies cough Genitourinary: Negative for dysuria. Musculoskeletal: Negative for back pain. Skin: Negative for  rash.    ____________________________________________   PHYSICAL EXAM:  VITAL SIGNS: ED Triage Vitals  Enc Vitals Group     BP 11/19/19 1053 (!) 148/92     Pulse Rate 11/19/19 1053 78     Resp 11/19/19 1053 15     Temp 11/19/19 1053 99 F (37.2 C)     Temp Source 11/19/19 1053 Oral     SpO2 11/19/19 1053 99 %     Weight 11/19/19 1102 218 lb (98.9 kg)     Height 11/19/19 1102 6' (1.829 m)     Head Circumference --      Peak Flow --      Pain Score 11/19/19 1102 10     Pain Loc --      Pain Edu? --      Excl. in [place]? --     Constitutional: Alert and oriented. Well appearing and in no acute distress. Eyes: Conjunctivae are normal.  Head: Atraumatic. Nose: No congestion/rhinnorhea. Mouth/Throat: Mucous membranes are moist.  Poor dentition with swelling noted around the left upper molars. Neck:  supple no lymphadenopathy noted Cardiovascular: Normal rate, regular rhythm. Heart sounds are normal Respiratory: Normal respiratory effort.  No retractions, lungs c t a  GU: deferred Musculoskeletal: FROM all extremities, warm and well perfused Neurologic:  Normal speech and language.  Skin:  Skin is warm, dry and intact. No rash noted. Psychiatric: Mood and affect are normal. Speech and behavior are normal.  ____________________________________________   LABS (all labs ordered are listed, but only abnormal results are displayed)  Labs Reviewed - No data to display ____________________________________________   ____________________________________________  RADIOLOGY    ____________________________________________   PROCEDURES  Procedure(s) performed: No  Procedures    ____________________________________________   INITIAL IMPRESSION / ASSESSMENT AND PLAN / ED COURSE  Pertinent labs & imaging results that were available during my care of the patient were reviewed by me and considered in my medical decision making (see chart for details).   Patient is a  48 year old male presents emergency department with left-sided dental pain.  Denies chest pain or shortness of breath.  Physical exam patient has poor dentition with dental caries and gum swelling on the left upper molars.  Explained findings to the patient.  He was given prescription for amoxicillin and tramadol.  Follow-up with his regular doctor/dentist or he could see one of the dentist on the discounted dental clinics.  He was discharged stable condition.    Greg Wood was evaluated in Emergency Department on 11/19/2019 for the symptoms described in the history of present illness. He was evaluated in the context of the global COVID-19 pandemic, which necessitated consideration that the patient might be at risk for infection with the SARS-CoV-2 virus that causes COVID-19. Institutional protocols and algorithms that pertain to the evaluation of patients at risk for COVID-19 are in a state of rapid change based on information released by regulatory bodies including the CDC and federal and state organizations. These policies and algorithms were followed during the patient's care in  the ED.   As part of my medical decision making, I reviewed the following data within the electronic MEDICAL RECORD NUMBER Nursing notes reviewed and incorporated, Old chart reviewed, Notes from prior ED visits and [place] Controlled Substance Database  ____________________________________________   FINAL CLINICAL IMPRESSION(S) / ED DIAGNOSES  Final diagnoses:  Dental abscess      NEW MEDICATIONS STARTED DURING THIS VISIT:  New Prescriptions   AMOXICILLIN (AMOXIL) 875 MG TABLET    Take 1 tablet (875 mg total) by mouth 2 (two) times daily.   TRAMADOL (ULTRAM) 50 MG TABLET    Take 1 tablet (50 mg total) by mouth every 6 (six) hours as needed.     Note:  This document was prepared using Dragon voice recognition software and may include unintentional dictation errors.    Faythe Ghee, PA-C 11/19/19 1206     Phineas Semen, MD 11/19/19 [phone]

## 2019-12-02 ENCOUNTER — Other Ambulatory Visit: Payer: Self-pay | Admitting: Family Medicine

## 2019-12-02 NOTE — Telephone Encounter (Signed)
Forwarding medication refill requests to PCP for review. 

## 2020-04-13 ENCOUNTER — Encounter: Payer: Self-pay | Admitting: Family Medicine

## 2020-05-12 ENCOUNTER — Other Ambulatory Visit: Payer: Self-pay | Admitting: Family Medicine

## 2020-05-12 NOTE — Telephone Encounter (Signed)
Requested Prescriptions  Pending Prescriptions Disp Refills   rosuvastatin (CRESTOR) 20 MG tablet [Pharmacy Med Name: ROSUVASTATIN 20MG  TABLETS] 90 tablet 1    Sig: TAKE 1 TABLET(20 MG) BY MOUTH DAILY     Cardiovascular:  Antilipid - Statins Failed - 05/12/2020  9:11 AM      Failed - LDL in normal range and within 360 days    LDL Chol Calc (NIH)  Date Value Ref Range Status  10/14/2019 91 0 - 99 mg/dL Final         Failed - HDL in normal range and within 360 days    HDL  Date Value Ref Range Status  10/14/2019 36 (L) >39 mg/dL Final         Passed - Total Cholesterol in normal range and within 360 days    Cholesterol, Total  Date Value Ref Range Status  10/14/2019 150 100 - 199 mg/dL Final   Cholesterol Piccolo, Waived  Date Value Ref Range Status  06/03/2017 WILL FOLLOW  Preliminary         Passed - Triglycerides in normal range and within 360 days    Triglycerides  Date Value Ref Range Status  10/14/2019 127 0 - 149 mg/dL Final   Triglycerides Piccolo,Waived  Date Value Ref Range Status  06/03/2017 WILL FOLLOW  Preliminary         Passed - Patient is not pregnant      Passed - Valid encounter within last 12 months    Recent Outpatient Visits          7 months ago Type 2 diabetes mellitus with hyperglycemia, without long-term current use of insulin (HCC)   [facility] [place], Megan P, DO   1 year ago Type 2 diabetes mellitus with hyperglycemia, without long-term current use of insulin (HCC)   [facility] [place], Megan P, DO   1 year ago Type 2 diabetes mellitus with hyperglycemia, without long-term current use of insulin (HCC)   [facility] [place], Megan P, DO   2 years ago Type 2 diabetes mellitus with hyperglycemia, without long-term current use of insulin (HCC)   [facility] [place], Megan P, DO   2 years ago Type 2 diabetes mellitus with hyperglycemia, without long-term current use of insulin (HCC)    [facility] [place], [place], DO

## 2020-05-22 ENCOUNTER — Other Ambulatory Visit: Payer: Self-pay | Admitting: Family Medicine

## 2020-05-22 NOTE — Telephone Encounter (Signed)
Requested medications are due for refill today?  Yes  Requested medications are on active medication list? Yes  Last Refill:  10/14/2019  # 90 with one refill   Future visit scheduled? No   Notes to Clinic:  Medication failed Refill RX protocol due to no valid encounter in the past 6 months and no labs in the past 180 days.  Last visit was 7 months ago.

## 2020-05-23 NOTE — Telephone Encounter (Signed)
Routing to provider  

## 2020-05-24 NOTE — Telephone Encounter (Signed)
Needs appt then I'll get him enough tot get there.

## 2020-05-25 NOTE — Telephone Encounter (Signed)
Message sent through my chart

## 2020-05-25 NOTE — Telephone Encounter (Signed)
Lvm to make med refill apt.

## 2020-05-29 ENCOUNTER — Encounter: Payer: Self-pay | Admitting: Family Medicine

## 2020-05-29 NOTE — Telephone Encounter (Signed)
Sent letter and lvm to make this apt.

## 2020-05-29 NOTE — Telephone Encounter (Signed)
Lvm to make this med refill apt. 

## 2020-06-12 ENCOUNTER — Other Ambulatory Visit: Payer: Self-pay | Admitting: Family Medicine

## 2020-06-12 NOTE — Telephone Encounter (Signed)
Requested medications are due for refill today?  Yes  Requested medications are on active medication list? Yes  Last Refill:    Metformin ER 500 mg 24HR Tabs    11/01/2019  # 120 with no refills  Lisinopril-HCTZ 20/12.5 mg tablets      10/14/2019  # 90 with one refill    Future visit scheduled?  No   Notes to Clinic:  Medications failed RX refill protocol due to no valid encounter in the past 6 months and no labs within the past 180 days.  Last labs were performed on 10/14/2019.  Last office visit was 8 months ago.

## 2020-06-15 ENCOUNTER — Other Ambulatory Visit: Payer: Self-pay | Admitting: Family Medicine

## 2020-06-15 MED ORDER — LISINOPRIL-HYDROCHLOROTHIAZIDE 20-12.5 MG PO TABS: 1.0000 | ORAL_TABLET | Freq: Every day | ORAL | 0 refills | Status: DC

## 2020-06-15 NOTE — Telephone Encounter (Signed)
Requested Prescriptions  Pending Prescriptions Disp Refills  . lisinopril-hydrochlorothiazide (ZESTORETIC) 20-12.5 MG tablet 7 tablet 0    Sig: Take 1 tablet by mouth daily.     Cardiovascular:  ACEI + Diuretic Combos Failed - 06/15/2020  5:03 PM      Failed - Na in normal range and within 180 days    Sodium  Date Value Ref Range Status  10/14/2019 143 134 - 144 mmol/L Final         Failed - K in normal range and within 180 days    Potassium  Date Value Ref Range Status  10/14/2019 4.2 3.5 - 5.2 mmol/L Final         Failed - Cr in normal range and within 180 days    Creatinine, Ser  Date Value Ref Range Status  10/14/2019 1.14 0.76 - 1.27 mg/dL Final         Failed - Ca in normal range and within 180 days    Calcium  Date Value Ref Range Status  10/14/2019 9.9 8.7 - 10.2 mg/dL Final         Failed - Last BP in normal range    BP Readings from Last 1 Encounters:  11/19/19 (!) 148/92         Failed - Valid encounter within last 6 months    Recent Outpatient Visits          8 months ago Type 2 diabetes mellitus with hyperglycemia, without long-term current use of insulin (HCC)   [facility] [place], Megan P, DO   1 year ago Type 2 diabetes mellitus with hyperglycemia, without long-term current use of insulin (HCC)   [facility] [place], Megan P, DO   1 year ago Type 2 diabetes mellitus with hyperglycemia, without long-term current use of insulin (HCC)   [facility] [place], Megan P, DO   2 years ago Type 2 diabetes mellitus with hyperglycemia, without long-term current use of insulin (HCC)   [facility] [place], Megan P, DO   2 years ago Type 2 diabetes mellitus with hyperglycemia, without long-term current use of insulin [facility])   [facility] [place], [place], DO      Future Appointments            In 4 days Dorcas Carrow, DO [facility], [facility]           Passed - Patient is not pregnant       Patient has scheduled appointment 06/19/20. Courtesy refill given.

## 2020-06-15 NOTE — Telephone Encounter (Signed)
Medication Refill - Medication: lisinopril-hydrochlorothiazide (ZESTORETIC) 20-12.5 MG tablet (Patient is completely out of medication and would like a short supply sent to pharmacy to hold him until his upcoming appt.)   Has the patient contacted their pharmacy? yes (Agent: If no, request that the patient contact the pharmacy for the refill.) (Agent: If yes, when and what did the pharmacy advise?)Contact PCP  Preferred Pharmacy (with phone number or street name):  [id number] [place] D[place],  - [address] AT [facility] [address] [facility] Phone:  [phone]  Fax:  [phone]       Agent: Please be advised that RX refills may take up to 3 business days. We ask that you follow-up with your pharmacy.

## 2020-06-19 ENCOUNTER — Telehealth: Payer: Self-pay | Admitting: Family Medicine

## 2020-06-20 ENCOUNTER — Other Ambulatory Visit: Payer: Self-pay | Admitting: Family Medicine

## 2020-06-20 NOTE — Telephone Encounter (Signed)
Routing to provider  

## 2020-06-20 NOTE — Telephone Encounter (Signed)
Requested medications are due for refill today?  Yes  Requested medications are on active medication list?  Yes  Last Refill:   06/15/2020  # 7 tablets which were provided to get patient through to his appointment on 06/19/2020.  Patient cancelled this appointment.    Future visit scheduled?  Yes on 06/29/2020.    Notes to Clinic:  Medication failed RX refill protocol due to no valid encounter in the past 6 months and no labs within the past 180 days.  Last office visit was 8 months ago.  Last labs were performed on 10/14/2019.

## 2020-06-29 ENCOUNTER — Encounter: Payer: Self-pay | Admitting: Nurse Practitioner

## 2020-06-29 ENCOUNTER — Ambulatory Visit (INDEPENDENT_AMBULATORY_CARE_PROVIDER_SITE_OTHER): Payer: BC Managed Care – PPO | Admitting: Nurse Practitioner

## 2020-06-29 ENCOUNTER — Other Ambulatory Visit: Payer: Self-pay

## 2020-06-29 VITALS — BP 132/86 | HR 71 | Temp 98.8°F | Ht 71.26 in | Wt 225.6 lb

## 2020-06-29 DIAGNOSIS — I129 Hypertensive chronic kidney disease with stage 1 through stage 4 chronic kidney disease, or unspecified chronic kidney disease: Secondary | ICD-10-CM

## 2020-06-29 DIAGNOSIS — Z1329 Encounter for screening for other suspected endocrine disorder: Secondary | ICD-10-CM

## 2020-06-29 DIAGNOSIS — E782 Mixed hyperlipidemia: Secondary | ICD-10-CM | POA: Diagnosis not present

## 2020-06-29 DIAGNOSIS — E1165 Type 2 diabetes mellitus with hyperglycemia: Secondary | ICD-10-CM | POA: Diagnosis not present

## 2020-06-29 MED ORDER — LISINOPRIL-HYDROCHLOROTHIAZIDE 20-12.5 MG PO TABS: 1.0000 | ORAL_TABLET | Freq: Every day | ORAL | 1 refills | Status: DC

## 2020-06-29 MED ORDER — METFORMIN HCL ER 500 MG PO TB24: ORAL_TABLET | ORAL | 1 refills | Status: DC

## 2020-06-29 MED ORDER — EMPAGLIFLOZIN 25 MG PO TABS: 25.0000 mg | ORAL_TABLET | Freq: Every day | ORAL | 1 refills | Status: DC

## 2020-06-29 MED ORDER — ROSUVASTATIN CALCIUM 20 MG PO TABS: ORAL_TABLET | ORAL | 1 refills | Status: DC

## 2020-06-29 NOTE — Assessment & Plan Note (Addendum)
Chronic, ongoing.  Last A1c in Nov. 2020 6.4%.  Reports has been out of diabetes medications for months.  Will resume diabetes medications today and check A1c.  Follow up in 1-2 months at physical.  CMP checked today.  Refills given for metformin XR 500mg  two tabs twice daily and jardiance 25 mg daily.  Start baby aspirin 81 mg daily.

## 2020-06-29 NOTE — Progress Notes (Signed)
BP 132/86 (BP Location: Left Arm, Cuff Size: Large)   Pulse 71   Temp 98.8 F (37.1 C) (Oral)   Ht 5' 11.26" (1.81 m)   Wt 225 lb 9.6 oz (102.3 kg)   SpO2 100%   BMI 31.24 kg/m    Subjective:    Patient ID: Greg Wood, male    DOB: Feb 15, 1971, 49 y.o.   MRN: [id number]  HPI: Greg Wood is a 49 y.o. male presenting for follow up.  Chief Complaint  Patient presents with  . Diabetes  . Hyperlipidemia  . Hypertension   DIABETES Currently out of all diabetes medications.  Was taking 2 Metformin XR 500 mg twice daily and jardiance 25 mg.  Tries to eat "good" and eats "a little bit of everything." Hypoglycemic episodes:no Polydipsia/polyuria: no Visual disturbance: no Chest pain: no Paresthesias: no Glucose Monitoring: yes  Accucheck frequency: sporadically  Fasting glucose:   Post prandial: 112  Evening: 140  Before meals: Taking Insulin?: no Blood Pressure Monitoring: rarely Retinal Examination: Not up to Date Foot Exam: Up to Date Diabetic Education: Completed Pneumovax: Not up to Date Influenza: Up to Date Aspirin: no  HYPERTENSION / HYPERLIPIDEMIA Patient reports he is taking lisinopril-HCTZ 20-12.5.   Satisfied with current treatment? yes Duration of hypertension: chronic BP monitoring frequency: rarely BP range: 112/70 - at work  BP medication side effects: no Past BP meds: lisinopril-hctz Duration of hyperlipidemia: chronic Cholesterol medication side effects: yes Cholesterol supplements: none Past cholesterol medications: rosuvastatin Medication compliance:  Good compliance Aspirin: no Recent stressors: no Recurrent headaches: no Visual changes: no Palpitations: no Dyspnea: no Chest pain: no Lower extremity edema: no Dizzy/lightheaded: no  No Known Allergies  Outpatient Encounter Medications as of 06/29/2020  Medication Sig  . lisinopril-hydrochlorothiazide (ZESTORETIC) 20-12.5 MG tablet Take 1 tablet by mouth daily.  . metFORMIN  (GLUCOPHAGE-XR) 500 MG 24 hr tablet TAKE 2 TABLETS BY MOUTH TWICE DAILY  . rosuvastatin (CRESTOR) 20 MG tablet Take 1 tablet by mouth daily.  . [DISCONTINUED] lisinopril-hydrochlorothiazide (ZESTORETIC) 20-12.5 MG tablet TAKE 1 TABLET BY MOUTH DAILY  . [DISCONTINUED] metFORMIN (GLUCOPHAGE-XR) 500 MG 24 hr tablet TAKE 2 TABLETS BY MOUTH TWICE DAILY  . [DISCONTINUED] rosuvastatin (CRESTOR) 20 MG tablet TAKE 1 TABLET(20 MG) BY MOUTH DAILY  . empagliflozin (JARDIANCE) 25 MG TABS tablet Take 1 tablet (25 mg total) by mouth daily.  Marland Kitchen glucose blood test strip 1 each by Other route as needed for other. Use as instructed (Patient not taking: Reported on 06/29/2020)  . [DISCONTINUED] amoxicillin (AMOXIL) 875 MG tablet Take 1 tablet (875 mg total) by mouth 2 (two) times daily.  . [DISCONTINUED] empagliflozin (JARDIANCE) 25 MG TABS tablet Take 25 mg by mouth daily. (Patient not taking: Reported on 06/29/2020)  . [DISCONTINUED] traMADol (ULTRAM) 50 MG tablet Take 1 tablet (50 mg total) by mouth every 6 (six) hours as needed.   No facility-administered encounter medications on file as of 06/29/2020.   Patient Active Problem List   Diagnosis Date Noted  . Noncompliance 01/28/2016  . Blood in the urine   . Allergic rhinitis due to pollen   . Diabetes mellitus with hyperglycemia (HCC)   . Hyperlipidemia   . Benign hypertensive renal disease    Past Medical History:  Diagnosis Date  . Acute bronchitis   . Acute lateral meniscus tear of right knee   . Acute medial meniscus tear of right knee   . Allergic rhinitis due to pollen   . Blood in  the urine   . Diabetes mellitus without complication (HCC)   . Hypercalcemia    Resolved on recheck  . Hyperlipidemia   . Hypertension   . Right medial tibial plateau fracture, closed, initial encounter    Relevant past medical, surgical, family and social history reviewed and updated as indicated. Interim medical history since our last visit reviewed.  Review of  Systems  Constitutional: Negative.  Negative for activity change, appetite change, fatigue and fever.  HENT: Negative.   Eyes: Negative.  Negative for visual disturbance.  Respiratory: Negative.  Negative for chest tightness, shortness of breath and wheezing.   Cardiovascular: Negative.  Negative for chest pain, palpitations and leg swelling.  Gastrointestinal: Negative.  Negative for nausea and vomiting.  Endocrine: Negative.  Negative for polydipsia and polyuria.  Musculoskeletal: Negative.   Skin: Negative.  Negative for color change and rash.  Neurological: Negative.  Negative for dizziness, light-headedness and headaches.  Psychiatric/Behavioral: Negative.     Per HPI unless specifically indicated above     Objective:    BP 132/86 (BP Location: Left Arm, Cuff Size: Large)   Pulse 71   Temp 98.8 F (37.1 C) (Oral)   Ht 5' 11.26" (1.81 m)   Wt 225 lb 9.6 oz (102.3 kg)   SpO2 100%   BMI 31.24 kg/m   Wt Readings from Last 3 Encounters:  06/29/20 225 lb 9.6 oz (102.3 kg)  11/19/19 218 lb (98.9 kg)  10/14/19 221 lb (100.2 kg)    Physical Exam Vitals and nursing note reviewed.  Constitutional:      General: He is not in acute distress.    Appearance: Normal appearance. He is obese.  Eyes:     General: No scleral icterus.    Extraocular Movements: Extraocular movements intact.  Neck:     Vascular: No carotid bruit.  Cardiovascular:     Rate and Rhythm: Normal rate.     Heart sounds: Normal heart sounds. No murmur heard.   Pulmonary:     Effort: Pulmonary effort is normal. No respiratory distress.     Breath sounds: Normal breath sounds. No wheezing or rhonchi.  Abdominal:     General: Abdomen is flat. Bowel sounds are normal. There is no distension.  Musculoskeletal:        General: Normal range of motion.     Cervical back: Normal range of motion.     Right lower leg: No edema.     Left lower leg: No edema.  Skin:    General: Skin is warm and dry.      Coloration: Skin is not jaundiced or pale.  Neurological:     General: No focal deficit present.     Mental Status: He is alert and oriented to person, place, and time.     Motor: No weakness.     Gait: Gait normal.  Psychiatric:        Mood and Affect: Mood normal.        Behavior: Behavior normal.        Thought Content: Thought content normal.        Judgment: Judgment normal.       Assessment & Plan:   Problem List Items Addressed This Visit      Endocrine   Diabetes mellitus with hyperglycemia (HCC) - Primary    Chronic, ongoing.  Last A1c in Nov. 2020 6.4%.  Reports has been out of diabetes medications for months.  Will resume diabetes medications today and check A1c.  Follow up in 1-2 months at physical.  CMP checked today.  Refills given for metformin XR 500mg  two tabs twice daily and jardiance 25 mg daily.  Start baby aspirin 81 mg daily.        Relevant Medications   empagliflozin (JARDIANCE) 25 MG TABS tablet   lisinopril-hydrochlorothiazide (ZESTORETIC) 20-12.5 MG tablet   metFORMIN (GLUCOPHAGE-XR) 500 MG 24 hr tablet   rosuvastatin (CRESTOR) 20 MG tablet   Other Relevant Orders   CBC with Differential/Platelet   TSH   HgB A1c     Genitourinary   Benign hypertensive renal disease    Chronic, ongoing.  Currently taking lisinopril-HCTZ 20-12.5.  BP improved upon recheck in office today.  BP cuff with large cuff ordered for patient today.  Start checking BP at home and notify/return to clinic if stays higher than 140/90.  CMP, CBC checked today.  Refills given for lisinopril-HCTZ      Relevant Orders   CBC with Differential/Platelet   Comprehensive metabolic panel     Other   Hyperlipidemia    Chronic, stable.  Continue rosuvastatin 20 mg for now.  Lipids checked today although patient not fasting.  CMP also checked.  Refills given.  Follow up 6 months.      Relevant Medications   lisinopril-hydrochlorothiazide (ZESTORETIC) 20-12.5 MG tablet   rosuvastatin  (CRESTOR) 20 MG tablet   Other Relevant Orders   CBC with Differential/Platelet   Lipid Panel w/o Chol/HDL Ratio    Other Visit Diagnoses    Screening for thyroid disorder       Relevant Orders   TSH       Follow up plan: Return in about 2 months (around 08/29/2020) for physical if patient wants, 6 months for f/u.

## 2020-06-29 NOTE — Patient Instructions (Addendum)
Nice meeting you, Greg Wood.  We will send you a letter with your lab results on Monday.   I have sent refills into your pharmacy for 6 months.   Be sure to pick up the baby aspriin (81 mg) and start taking that daily.  If you would like, go ahead and schedule your physical exam.  If not, we will see you in about 6 months.  Take care and please let us know if you need anything.  Carbohydrate Counting for Diabetes Mellitus, Adult  Carbohydrate counting is a method of keeping track of how many carbohydrates you eat. Eating carbohydrates naturally increases the amount of sugar (glucose) in the blood. Counting how many carbohydrates you eat helps keep your blood glucose within normal limits, which helps you manage your diabetes (diabetes mellitus). It is important to know how many carbohydrates you can safely have in each meal. This is different for every person. A diet and nutrition specialist (registered dietitian) can help you make a meal plan and calculate how many carbohydrates you should have at each meal and snack. Carbohydrates are found in the following foods:  Grains, such as breads and cereals.  Dried beans and soy products.  Starchy vegetables, such as potatoes, peas, and corn.  Fruit and fruit juices.  Milk and yogurt.  Sweets and snack foods, such as cake, cookies, candy, chips, and soft drinks. How do I count carbohydrates? There are two ways to count carbohydrates in food. You can use either of the methods or a combination of both. Reading "Nutrition Facts" on packaged food The "Nutrition Facts" list is included on the labels of almost all packaged foods and beverages in the [place] It includes:  The serving size.  Information about nutrients in each serving, including the grams (g) of carbohydrate per serving. To use the "Nutrition Facts":  Decide how many servings you will have.  Multiply the number of servings by the number of carbohydrates per serving.  The resulting  number is the total amount of carbohydrates that you will be having. Learning standard serving sizes of other foods When you eat carbohydrate foods that are not packaged or do not include "Nutrition Facts" on the label, you need to measure the servings in order to count the amount of carbohydrates:  Measure the foods that you will eat with a food scale or measuring cup, if needed.  Decide how many standard-size servings you will eat.  Multiply the number of servings by 15. Most carbohydrate-rich foods have about 15 g of carbohydrates per serving. ? For example, if you eat 8 oz (170 g) of strawberries, you will have eaten 2 servings and 30 g of carbohydrates (2 servings x 15 g = 30 g).  For foods that have more than one food mixed, such as soups and casseroles, you must count the carbohydrates in each food that is included. The following list contains standard serving sizes of common carbohydrate-rich foods. Each of these servings has about 15 g of carbohydrates:   hamburger bun or  English muffin.   oz (15 mL) syrup.   oz (14 g) jelly.  1 slice of bread.  1 six-inch tortilla.  3 oz (85 g) cooked rice or pasta.  4 oz (113 g) cooked dried beans.  4 oz (113 g) starchy vegetable, such as peas, corn, or potatoes.  4 oz (113 g) hot cereal.  4 oz (113 g) mashed potatoes or  of a large baked potato.  4 oz (113 g) canned or  frozen fruit.  4 oz (120 mL) fruit juice.  4-6 crackers.  6 chicken nuggets.  6 oz (170 g) unsweetened dry cereal.  6 oz (170 g) plain fat-free yogurt or yogurt sweetened with artificial sweeteners.  8 oz (240 mL) milk.  8 oz (170 g) fresh fruit or one small piece of fruit.  24 oz (680 g) popped popcorn. Example of carbohydrate counting Sample meal  3 oz (85 g) chicken breast.  6 oz (170 g) Lamia rice.  4 oz (113 g) corn.  8 oz (240 mL) milk.  8 oz (170 g) strawberries with sugar-free whipped topping. Carbohydrate calculation 1. Identify  the foods that contain carbohydrates: ? Rice. ? Corn. ? Milk. ? Strawberries. 2. Calculate how many servings you have of each food: ? 2 servings rice. ? 1 serving corn. ? 1 serving milk. ? 1 serving strawberries. 3. Multiply each number of servings by 15 g: ? 2 servings rice x 15 g = 30 g. ? 1 serving corn x 15 g = 15 g. ? 1 serving milk x 15 g = 15 g. ? 1 serving strawberries x 15 g = 15 g. 4. Add together all of the amounts to find the total grams of carbohydrates eaten: ? 30 g + 15 g + 15 g + 15 g = 75 g of carbohydrates total. Summary  Carbohydrate counting is a method of keeping track of how many carbohydrates you eat.  Eating carbohydrates naturally increases the amount of sugar (glucose) in the blood.  Counting how many carbohydrates you eat helps keep your blood glucose within normal limits, which helps you manage your diabetes.  A diet and nutrition specialist (registered dietitian) can help you make a meal plan and calculate how many carbohydrates you should have at each meal and snack. This information is not intended to replace advice given to you by your health care provider. Make sure you discuss any questions you have with your health care provider. Document Revised: 06/04/2017 Document Reviewed: 04/23/2016 Elsevier Patient Education  2020 [employer].

## 2020-06-29 NOTE — Assessment & Plan Note (Signed)
Chronic, stable.  Continue rosuvastatin 20 mg for now.  Lipids checked today although patient not fasting.  CMP also checked.  Refills given.  Follow up 6 months.

## 2020-06-29 NOTE — Assessment & Plan Note (Signed)
Chronic, ongoing.  Currently taking lisinopril-HCTZ 20-12.5.  BP improved upon recheck in office today.  BP cuff with large cuff ordered for patient today.  Start checking BP at home and notify/return to clinic if stays higher than 140/90.  CMP, CBC checked today.  Refills given for lisinopril-HCTZ

## 2020-06-30 LAB — HEMOGLOBIN A1C
Est. average glucose Bld gHb Est-mCnc: 148 mg/dL
Hgb A1c MFr Bld: 6.8 % — ABNORMAL HIGH (ref 4.8–5.6)

## 2020-06-30 LAB — COMPREHENSIVE METABOLIC PANEL
ALT: 16 IU/L (ref 0–44)
AST: 16 IU/L (ref 0–40)
Albumin/Globulin Ratio: 2.1 (ref 1.2–2.2)
Albumin: 4.6 g/dL (ref 3.8–4.9)
Alkaline Phosphatase: 61 IU/L (ref 48–121)
BUN/Creatinine Ratio: 14 (ref 9–20)
BUN: 12 mg/dL (ref 6–24)
Bilirubin Total: 0.3 mg/dL (ref 0.0–1.2)
CO2: 22 mmol/L (ref 20–29)
Calcium: 9.6 mg/dL (ref 8.7–10.2)
Chloride: 103 mmol/L (ref 96–106)
Creatinine, Ser: 0.87 mg/dL (ref 0.76–1.27)
GFR calc Af Amer: 116 mL/min/{1.73_m2} (ref >59)
GFR calc non Af Amer: 100 mL/min/{1.73_m2} (ref >59)
Globulin, Total: 2.2 g/dL (ref 1.5–4.5)
Glucose: 137 mg/dL — ABNORMAL HIGH (ref 65–99)
Potassium: 4.3 mmol/L (ref 3.5–5.2)
Sodium: 138 mmol/L (ref 134–144)
Total Protein: 6.8 g/dL (ref 6.0–8.5)

## 2020-06-30 LAB — CBC WITH DIFFERENTIAL/PLATELET
Basophils Absolute: 0 10*3/uL (ref 0.0–0.2)
Basos: 1 %
EOS (ABSOLUTE): 0.1 10*3/uL (ref 0.0–0.4)
Eos: 3 %
Hematocrit: 44.6 % (ref 37.5–51.0)
Hemoglobin: 14.2 g/dL (ref 13.0–17.7)
Immature Grans (Abs): 0 10*3/uL (ref 0.0–0.1)
Immature Granulocytes: 0 %
Lymphocytes Absolute: 1.7 10*3/uL (ref 0.7–3.1)
Lymphs: 34 %
MCH: 27.5 pg (ref 26.6–33.0)
MCHC: 31.8 g/dL (ref 31.5–35.7)
MCV: 86 fL (ref 79–97)
Monocytes Absolute: 0.6 10*3/uL (ref 0.1–0.9)
Monocytes: 12 %
Neutrophils Absolute: 2.4 10*3/uL (ref 1.4–7.0)
Neutrophils: 50 %
Platelets: 187 10*3/uL (ref 150–450)
RBC: 5.17 x10E6/uL (ref 4.14–5.80)
RDW: 12.9 % (ref 11.6–15.4)
WBC: 4.9 10*3/uL (ref 3.4–10.8)

## 2020-06-30 LAB — LIPID PANEL W/O CHOL/HDL RATIO
Cholesterol, Total: 147 mg/dL (ref 100–199)
HDL: 41 mg/dL (ref >39)
LDL Chol Calc (NIH): 76 mg/dL (ref 0–99)
Triglycerides: 174 mg/dL — ABNORMAL HIGH (ref 0–149)
VLDL Cholesterol Cal: 30 mg/dL (ref 5–40)

## 2020-06-30 LAB — TSH: TSH: 0.792 u[IU]/mL (ref 0.450–4.500)

## 2020-07-02 ENCOUNTER — Encounter: Payer: Self-pay | Admitting: Nurse Practitioner

## 2020-07-06 ENCOUNTER — Telehealth: Payer: Self-pay | Admitting: Family Medicine

## 2020-07-06 NOTE — Telephone Encounter (Signed)
Copied from CRM [id number]. Topic: General - Inquiry >> Jul 06, 2020 11:09 AM Greg Wood wrote: Reason for CRM: pt states he was to have bp cuff order sent in to the pharmacy.  Pharmacy told him his insurance will not cover. Please send to any medical supply in [place] in hopes his insurance pay for.

## 2020-07-07 ENCOUNTER — Other Ambulatory Visit: Payer: Self-pay | Admitting: Family Medicine

## 2020-07-09 NOTE — Telephone Encounter (Signed)
RX filled out for BP cuff. Will fax to [facility] after provider signature.

## 2020-07-11 NOTE — Telephone Encounter (Signed)
Order signed and faxed to [facility]. Called and LVM letting patient know this was done for him.

## 2020-09-28 ENCOUNTER — Encounter: Payer: BC Managed Care – PPO | Admitting: Family Medicine

## 2020-11-11 ENCOUNTER — Emergency Department
Admission: EM | Admit: 2020-11-11 | Discharge: 2020-11-11 | Disposition: A | Discharge status: Home / Self Care | Payer: BC Managed Care – PPO | Attending: Emergency Medicine | Admitting: Emergency Medicine

## 2020-11-11 ENCOUNTER — Encounter: Payer: Self-pay | Admitting: Emergency Medicine

## 2020-11-11 ENCOUNTER — Other Ambulatory Visit: Payer: Self-pay

## 2020-11-11 DIAGNOSIS — Z96651 Presence of right artificial knee joint: Secondary | ICD-10-CM | POA: Insufficient documentation

## 2020-11-11 DIAGNOSIS — Z79899 Other long term (current) drug therapy: Secondary | ICD-10-CM | POA: Insufficient documentation

## 2020-11-11 DIAGNOSIS — E119 Type 2 diabetes mellitus without complications: Secondary | ICD-10-CM | POA: Diagnosis not present

## 2020-11-11 DIAGNOSIS — Z794 Long term (current) use of insulin: Secondary | ICD-10-CM | POA: Insufficient documentation

## 2020-11-11 DIAGNOSIS — K0889 Other specified disorders of teeth and supporting structures: Secondary | ICD-10-CM | POA: Diagnosis not present

## 2020-11-11 DIAGNOSIS — I10 Essential (primary) hypertension: Secondary | ICD-10-CM | POA: Diagnosis not present

## 2020-11-11 MED ORDER — TRAMADOL HCL 50 MG PO TABS: 50.0000 mg | ORAL_TABLET | Freq: Four times a day (QID) | ORAL | 0 refills | Status: DC | PRN

## 2020-11-11 MED ORDER — AMOXICILLIN 875 MG PO TABS
875.0000 mg | ORAL_TABLET | Freq: Two times a day (BID) | ORAL | 0 refills | Status: DC
Start: 2020-11-11 — End: 2021-02-15

## 2020-11-11 NOTE — Discharge Instructions (Addendum)
Keep your next appointment with your dentist.  Begin taking antibiotics until completely finished.  Also tramadol was sent to your pharmacy if needed for severe pain.  You may also take Tylenol or ibuprofen with this medication for mild to moderate pain.  Soft foods are to on the opposite side.

## 2020-11-11 NOTE — ED Triage Notes (Signed)
Pt reports toothache to left upper jaw for a few weeks. Pt reports had an appt with his dentist but missed it and the next one is scheduled in a week

## 2020-11-11 NOTE — ED Provider Notes (Signed)
[facility] Emergency Department Provider Note  ____________________________________________   Event Date/Time   First MD Initiated Contact with Patient 11/11/20 1126     (approximate)  I have reviewed the triage vital signs and the nursing notes.   HISTORY  Chief Complaint Dental Pain   HPI Greg Wood is a 49 y.o. male Greg Wood to the ED with complaint of left upper jaw pain for several weeks.  Patient states AN appointment with his dentist 2 weeks ago and missed it and has another appointment scheduled.  Patient states that approximately 4 weeks ago this tooth began giving him problems.  He has taken over-the-counter medication without any improvement.       Past Medical History:  Diagnosis Date  . Acute bronchitis   . Acute lateral meniscus tear of right knee   . Acute medial meniscus tear of right knee   . Allergic rhinitis due to pollen   . Blood in the urine   . Diabetes mellitus without complication (HCC)   . Hypercalcemia    Resolved on recheck  . Hyperlipidemia   . Hypertension   . Right medial tibial plateau fracture, closed, initial encounter     Patient Active Problem List   Diagnosis Date Noted  . Noncompliance 01/28/2016  . Blood in the urine   . Allergic rhinitis due to pollen   . Diabetes mellitus with hyperglycemia (HCC)   . Hyperlipidemia   . Benign hypertensive renal disease     Past Surgical History:  Procedure Laterality Date  . CHONDROPLASTY Right 08/04/2018   Procedure: CHONDROPLASTY;  Surgeon: Salvatore Marvel, MD;  Location: [facility];  Service: Orthopedics;  Laterality: Right;  . KNEE ARTHROSCOPY WITH LATERAL MENISECTOMY Right 08/04/2018   Procedure: KNEE ARTHROSCOPY WITH LATERAL MENISECTOMY;  Surgeon: Salvatore Marvel, MD;  Location: [facility];  Service: Orthopedics;  Laterality: Right;  . KNEE ARTHROSCOPY WITH MEDIAL MENISECTOMY Right 08/04/2018   Procedure: KNEE ARTHROSCOPY WITH  MEDIAL MENISECTOMY;  Surgeon: Salvatore Marvel, MD;  Location: [facility];  Service: Orthopedics;  Laterality: Right;  . KNEE ARTHROSCOPY WITH SUBCHONDROPLASTY Right 08/04/2018   Procedure: CHONDROPLASTY AND SUBCHONDROPLASTY MEDIAL TIBIAL PLATEAU;  Surgeon: Salvatore Marvel, MD;  Location: [facility];  Service: Orthopedics;  Laterality: Right;    Prior to Admission medications   Medication Sig Start Date End Date Taking? Authorizing Provider  amoxicillin (AMOXIL) 875 MG tablet Take 1 tablet (875 mg total) by mouth 2 (two) times daily. 11/11/20   Tommi Rumps, PA-C  empagliflozin (JARDIANCE) 25 MG TABS tablet Take 1 tablet (25 mg total) by mouth daily. 06/29/20   Cathlean Marseilles A, NP  glucose blood test strip 1 each by Other route as needed for other. Use as instructed Patient not taking: Reported on 06/29/2020    Olevia Perches P, DO  lisinopril-hydrochlorothiazide (ZESTORETIC) 20-12.5 MG tablet Take 1 tablet by mouth daily. 06/29/20   Valentino Nose, NP  metFORMIN (GLUCOPHAGE-XR) 500 MG 24 hr tablet TAKE 2 TABLETS BY MOUTH TWICE DAILY 06/29/20   Cathlean Marseilles A, NP  rosuvastatin (CRESTOR) 20 MG tablet Take 1 tablet by mouth daily. 06/29/20   Valentino Nose, NP  traMADol (ULTRAM) 50 MG tablet Take 1 tablet (50 mg total) by mouth every 6 (six) hours as needed. 11/11/20   Tommi Rumps, PA-C    Allergies Patient has no known allergies.  Family History  Problem Relation Age of Onset  . Diabetes Mother   .  Diabetes Maternal Grandmother     Social History Social History   Tobacco Use  . Smoking status: Never Smoker  . Smokeless tobacco: Never Used  Vaping Use  . Vaping Use: Never used  Substance Use Topics  . Alcohol use: No  . Drug use: No    Review of Systems Constitutional: No fever/chills Eyes: No visual changes. ENT: No sore throat.  Positive for dental pain. Cardiovascular: Denies chest pain. Respiratory: Denies shortness of  breath. Gastrointestinal:  No nausea, no vomiting. Musculoskeletal: Negative for back pain. Skin: Negative for rash. Neurological: Negative for headaches, focal weakness or numbness. ____________________________________________   PHYSICAL EXAM:  VITAL SIGNS: ED Triage Vitals  Enc Vitals Group     BP 11/11/20 1114 (!) 141/87     Pulse Rate 11/11/20 1114 84     Resp 11/11/20 1114 20     Temp 11/11/20 1114 98.6 F (37 C)     Temp Source 11/11/20 1114 Oral     SpO2 11/11/20 1114 97 %     Weight 11/11/20 1113 220 lb (99.8 kg)     Height 11/11/20 1113 6' (1.829 m)     Head Circumference --      Peak Flow --      Pain Score 11/11/20 1112 8     Pain Loc --      Pain Edu? --      Excl. in GC? --     Constitutional: Alert and oriented. Well appearing and in no acute distress. Eyes: Conjunctivae are normal.  Head: Atraumatic. Mouth: Left upper molars with poor repair.  No obvious abscess or drainage is noted.  Gums are moderately tender to palpation. Neck: No stridor.   Cardiovascular: Normal rate, regular rhythm. Grossly normal heart sounds.  Good peripheral circulation. Respiratory: Normal respiratory effort.  No retractions. Lungs CTAB. Musculoskeletal: Moves upper and lower extremities they have difficulty normal gait was noted. Neurologic:  Normal speech and language. No gross focal neurologic deficits are appreciated. Skin:  Skin is warm, dry and intact. Psychiatric: Mood and affect are normal. Speech and behavior are normal.  ____________________________________________   LABS (all labs ordered are listed, but only abnormal results are displayed)  Labs Reviewed - No data to display ____________________________________________   PROCEDURES  Procedure(s) performed (including Critical Care):  Procedures   ____________________________________________   INITIAL IMPRESSION / ASSESSMENT AND PLAN / ED COURSE  As part of my medical decision making, I reviewed the  following data within the electronic MEDICAL RECORD NUMBER Notes from prior ED visits and [place] Controlled Substance Database  49 year old male presents to the ED with complaint of dental pain for approximately 4 weeks.  States that he missed his dental appointment and now has another appointment scheduled in 1 week.  Left upper dental area is moderately tender to light palpation but no actual abscess or drainage is noted.  Patient was placed on tramadol 50 mg 1 every 6 hours as needed for pain #8 and amoxicillin 875 twice daily for 10 days.  He is encouraged to keep this appointment at his dentist.  ____________________________________________   FINAL CLINICAL IMPRESSION(S) / ED DIAGNOSES  Final diagnoses:  Pain, dental     ED Discharge Orders         Ordered    amoxicillin (AMOXIL) 875 MG tablet  2 times daily        11/11/20 1136    traMADol (ULTRAM) 50 MG tablet  Every 6 hours PRN  11/11/20 1136          *Please note:  REISE GLADNEY was evaluated in Emergency Department on 11/11/2020 for the symptoms described in the history of present illness. He was evaluated in the context of the global COVID-19 pandemic, which necessitated consideration that the patient might be at risk for infection with the SARS-CoV-2 virus that causes COVID-19. Institutional protocols and algorithms that pertain to the evaluation of patients at risk for COVID-19 are in a state of rapid change based on information released by regulatory bodies including the CDC and federal and state organizations. These policies and algorithms were followed during the patient's care in the ED.  Some ED evaluations and interventions may be delayed as a result of limited staffing during and the pandemic.*   Note:  This document was prepared using Dragon voice recognition software and may include unintentional dictation errors.    Tommi Rumps, PA-C 11/11/20 1447    Shaune Pollack, MD 11/11/20 [phone]

## 2020-11-12 ENCOUNTER — Other Ambulatory Visit: Payer: Self-pay | Admitting: Family Medicine

## 2020-11-12 NOTE — Telephone Encounter (Signed)
Patient has refill at pharmacy. If taking as prescribed there should be enough medication thru 12/31/19. Refusing at this time-not needed-requested too early.

## 2020-11-18 ENCOUNTER — Other Ambulatory Visit: Payer: Self-pay

## 2020-11-18 ENCOUNTER — Encounter: Payer: Self-pay | Admitting: Emergency Medicine

## 2020-11-18 ENCOUNTER — Ambulatory Visit
Admission: EM | Admit: 2020-11-18 | Discharge: 2020-11-18 | Disposition: A | Discharge status: Home / Self Care | Payer: BC Managed Care – PPO | Attending: Sports Medicine | Admitting: Sports Medicine

## 2020-11-18 DIAGNOSIS — U071 COVID-19: Secondary | ICD-10-CM | POA: Diagnosis not present

## 2020-11-18 DIAGNOSIS — Z20822 Contact with and (suspected) exposure to covid-19: Secondary | ICD-10-CM | POA: Diagnosis not present

## 2020-11-18 NOTE — ED Triage Notes (Signed)
Patient here for nurse visit for COVID Test.  Patient denies any symptoms.  Patient states that his wife tested positive for COVID today.

## 2020-11-18 NOTE — Discharge Instructions (Signed)

## 2020-11-19 LAB — SARS CORONAVIRUS 2 (TAT 6-24 HRS): SARS Coronavirus 2: POSITIVE — AB

## 2021-01-04 ENCOUNTER — Other Ambulatory Visit: Payer: Self-pay

## 2021-01-04 NOTE — Telephone Encounter (Signed)
Patient last seen 06/29/20, no appointment scheduled. He is due for 6 month f/up visit. Please schedule.   Routing to provider for refill.

## 2021-01-04 NOTE — Telephone Encounter (Signed)
Called pt to schedule f/u no answer left vm

## 2021-01-05 ENCOUNTER — Other Ambulatory Visit: Payer: Self-pay | Admitting: Family Medicine

## 2021-01-05 MED ORDER — EMPAGLIFLOZIN 25 MG PO TABS: 25.0000 mg | ORAL_TABLET | Freq: Every day | ORAL | 0 refills | Status: DC

## 2021-01-05 MED ORDER — METFORMIN HCL ER 500 MG PO TB24: ORAL_TABLET | ORAL | 0 refills | Status: DC

## 2021-01-11 ENCOUNTER — Other Ambulatory Visit: Payer: Self-pay

## 2021-01-11 NOTE — Telephone Encounter (Signed)
Called pt no answer left vm 

## 2021-01-11 NOTE — Telephone Encounter (Signed)
Patient overdue for 6 month follow up. Please call to schedule. Patient last seen 06/29/20.

## 2021-01-11 NOTE — Telephone Encounter (Signed)
Pt has been called twice no answer left vm

## 2021-01-13 ENCOUNTER — Other Ambulatory Visit: Payer: Self-pay | Admitting: Family Medicine

## 2021-01-13 MED ORDER — LISINOPRIL-HYDROCHLOROTHIAZIDE 20-12.5 MG PO TABS: 1.0000 | ORAL_TABLET | Freq: Every day | ORAL | 0 refills | Status: DC

## 2021-01-14 ENCOUNTER — Encounter: Payer: Self-pay | Admitting: Family Medicine

## 2021-01-14 NOTE — Telephone Encounter (Signed)
Lvm to make apt, sent letter. 

## 2021-01-25 ENCOUNTER — Ambulatory Visit: Payer: BC Managed Care – PPO | Admitting: Family Medicine

## 2021-02-08 ENCOUNTER — Other Ambulatory Visit: Payer: Self-pay | Admitting: Family Medicine

## 2021-02-08 ENCOUNTER — Other Ambulatory Visit: Payer: Self-pay | Admitting: Nurse Practitioner

## 2021-02-08 NOTE — Telephone Encounter (Signed)
Lvm to schedule apt/  

## 2021-02-08 NOTE — Telephone Encounter (Signed)
Requested medications are due for refill today yes  Requested medications are on the active medication list yes  Last refill 2/12  Last visit 06/2020  Future visit scheduled No, was asked to return in 6 months  Notes to clinic Failed protocol due to no valid visit within 6  months, no upcoming visit scheduled.

## 2021-02-08 NOTE — Telephone Encounter (Signed)
Patient does not have future appointment scheduled. Last seen 06/2020

## 2021-02-11 ENCOUNTER — Encounter: Payer: Self-pay | Admitting: Family Medicine

## 2021-02-11 ENCOUNTER — Telehealth: Payer: Self-pay | Admitting: Family Medicine

## 2021-02-11 NOTE — Telephone Encounter (Signed)
Unable to close due to unassigned

## 2021-02-11 NOTE — Telephone Encounter (Signed)
Please get patient scheduled for a follow up, patient has already had a courtesy refill

## 2021-02-11 NOTE — Telephone Encounter (Signed)
Sent pt mychart letter for refill and mailed letter

## 2021-02-11 NOTE — Telephone Encounter (Signed)
Lvm to schedule apt/  

## 2021-02-11 NOTE — Telephone Encounter (Signed)
Have called X3 no answer to schedule apt.

## 2021-02-11 NOTE — Telephone Encounter (Signed)
Requested medication (s) are due for refill today: yes  Requested medication (s) are on the active medication list: yes  Last refill:  01/13/2021  Future visit scheduled: no  Notes to clinic:  Patient has been giving courtesy refill Patient canceled last appt    Requested Prescriptions  Pending Prescriptions Disp Refills   lisinopril-hydrochlorothiazide (ZESTORETIC) 20-12.5 MG tablet [Pharmacy Med Name: LISINOPRIL-HCTZ 20/12.5MG  TABLETS] 30 tablet 0    Sig: TAKE 1 TABLET BY MOUTH DAILY      Cardiovascular:  ACEI + Diuretic Combos Failed - 02/11/2021  3:17 AM      Failed - Na in normal range and within 180 days    Sodium  Date Value Ref Range Status  06/29/2020 138 134 - 144 mmol/L Final          Failed - K in normal range and within 180 days    Potassium  Date Value Ref Range Status  06/29/2020 4.3 3.5 - 5.2 mmol/L Final          Failed - Cr in normal range and within 180 days    Creatinine, Ser  Date Value Ref Range Status  06/29/2020 0.87 0.76 - 1.27 mg/dL Final          Failed - Ca in normal range and within 180 days    Calcium  Date Value Ref Range Status  06/29/2020 9.6 8.7 - 10.2 mg/dL Final          Failed - Last BP in normal range    BP Readings from Last 1 Encounters:  11/18/20 140/86          Failed - Valid encounter within last 6 months    Recent Outpatient Visits           7 months ago Type 2 diabetes mellitus with hyperglycemia, without long-term current use of insulin (HCC)   [facility] Valentino Nose, NP   1 year ago Type 2 diabetes mellitus with hyperglycemia, without long-term current use of insulin (HCC)   [facility] [place], Megan P, DO   1 year ago Type 2 diabetes mellitus with hyperglycemia, without long-term current use of insulin (HCC)   [facility] [place], Megan P, DO   2 years ago Type 2 diabetes mellitus with hyperglycemia, without long-term current use of insulin (HCC)   [facility] [place], Megan P, DO   3 years ago Type 2 diabetes mellitus with hyperglycemia, without long-term current use of insulin [facility])   [facility] Corr[place], [place], DO                Passed - Patient is not pregnant

## 2021-02-12 NOTE — Telephone Encounter (Signed)
Pt has been called X3 and sent letter.

## 2021-02-13 NOTE — Telephone Encounter (Signed)
Pt returned office call. Assist pt with scheduled ov for med refills. Pt says that he is completely out of his medication and would like to know if provider is able to assist until his apt?  Please advise.

## 2021-02-14 MED ORDER — ROSUVASTATIN CALCIUM 20 MG PO TABS: ORAL_TABLET | ORAL | 0 refills | Status: DC

## 2021-02-14 MED ORDER — LISINOPRIL-HYDROCHLOROTHIAZIDE 20-12.5 MG PO TABS: 1.0000 | ORAL_TABLET | Freq: Every day | ORAL | 0 refills | Status: DC

## 2021-02-14 NOTE — Addendum Note (Signed)
Addended by: Dorcas Carrow on: 02/14/2021 08:18 AM   Modules accepted: Orders

## 2021-02-15 ENCOUNTER — Ambulatory Visit: Payer: BC Managed Care – PPO | Admitting: Family Medicine

## 2021-02-15 ENCOUNTER — Encounter: Payer: Self-pay | Admitting: Family Medicine

## 2021-02-15 ENCOUNTER — Other Ambulatory Visit: Payer: Self-pay

## 2021-02-15 VITALS — BP 124/79 | HR 72 | Temp 98.2°F | Wt 221.0 lb

## 2021-02-15 DIAGNOSIS — I129 Hypertensive chronic kidney disease with stage 1 through stage 4 chronic kidney disease, or unspecified chronic kidney disease: Secondary | ICD-10-CM | POA: Diagnosis not present

## 2021-02-15 DIAGNOSIS — E782 Mixed hyperlipidemia: Secondary | ICD-10-CM | POA: Diagnosis not present

## 2021-02-15 DIAGNOSIS — E1165 Type 2 diabetes mellitus with hyperglycemia: Secondary | ICD-10-CM

## 2021-02-15 DIAGNOSIS — Z125 Encounter for screening for malignant neoplasm of prostate: Secondary | ICD-10-CM | POA: Diagnosis not present

## 2021-02-15 LAB — MICROALBUMIN, URINE WAIVED
Creatinine, Urine Waived: 200 mg/dL (ref 10–300)
Microalb, Ur Waived: 30 mg/L — ABNORMAL HIGH (ref 0–19)
Microalb/Creat Ratio: <30 mg/g (ref <30)

## 2021-02-15 LAB — URINALYSIS, ROUTINE W REFLEX MICROSCOPIC
Bilirubin, UA: NEGATIVE
Ketones, UA: NEGATIVE
Leukocytes,UA: NEGATIVE
Nitrite, UA: NEGATIVE
Protein,UA: NEGATIVE
RBC, UA: NEGATIVE
Specific Gravity, UA: 1.02 (ref 1.005–1.030)
Urobilinogen, Ur: 0.2 mg/dL (ref 0.2–1.0)
pH, UA: 7 (ref 5.0–7.5)

## 2021-02-15 LAB — BAYER DCA HB A1C WAIVED: HB A1C (BAYER DCA - WAIVED): 6.4 % (ref <7.0)

## 2021-02-15 MED ORDER — METFORMIN HCL ER 500 MG PO TB24: 1000.0000 mg | ORAL_TABLET | Freq: Two times a day (BID) | ORAL | 1 refills | Status: DC

## 2021-02-15 MED ORDER — EMPAGLIFLOZIN 25 MG PO TABS: 25.0000 mg | ORAL_TABLET | Freq: Every day | ORAL | 1 refills | Status: DC

## 2021-02-15 MED ORDER — ROSUVASTATIN CALCIUM 20 MG PO TABS: ORAL_TABLET | ORAL | 1 refills | Status: DC

## 2021-02-15 MED ORDER — LISINOPRIL-HYDROCHLOROTHIAZIDE 20-12.5 MG PO TABS: 1.0000 | ORAL_TABLET | Freq: Every day | ORAL | 1 refills | Status: DC

## 2021-02-15 NOTE — Progress Notes (Signed)
BP 124/79   Pulse 72   Temp 98.2 F (36.8 C)   Wt 221 lb (100.2 kg)   SpO2 97%   BMI 29.97 kg/m    Subjective:    Patient ID: Greg Wood, male    DOB: May 05, 1971, 50 y.o.   MRN: [id number]  HPI: Greg Wood is a 50 y.o. male  Chief Complaint  Patient presents with  . Diabetes  . Hypertension  . Hyperlipidemia   HYPERTENSION / HYPERLIPIDEMIA Satisfied with current treatment? yes Duration of hypertension: chronic BP monitoring frequency: not checking BP medication side effects: no Past BP meds: lisinopril-HCTZ Duration of hyperlipidemia: chronic Cholesterol medication side effects: no Cholesterol supplements: none Past cholesterol medications: crestor Medication compliance: excellent compliance Aspirin: no Recent stressors: no Recurrent headaches: no Visual changes: no Palpitations: no Dyspnea: no Chest pain: no Lower extremity edema: no Dizzy/lightheaded: no  DIABETES Hypoglycemic episodes:no Polydipsia/polyuria: no Visual disturbance: no Chest pain: no Paresthesias: no Glucose Monitoring: no  Accucheck frequency: Not Checking Taking Insulin?: no Blood Pressure Monitoring: not checking Retinal Examination: Not up to Date Foot Exam: Up to Date Diabetic Education: Completed Pneumovax: Not up to Date Influenza: Up to Date Aspirin: yes  Relevant past medical, surgical, family and social history reviewed and updated as indicated. Interim medical history since our last visit reviewed. Allergies and medications reviewed and updated.  Review of Systems  Constitutional: Negative.   Respiratory: Negative.   Cardiovascular: Negative.   Gastrointestinal: Negative.   Musculoskeletal: Negative.   Psychiatric/Behavioral: Negative.     Per HPI unless specifically indicated above     Objective:    BP 124/79   Pulse 72   Temp 98.2 F (36.8 C)   Wt 221 lb (100.2 kg)   SpO2 97%   BMI 29.97 kg/m   Wt Readings from Last 3 Encounters:  02/15/21 221  lb (100.2 kg)  11/18/20 220 lb (99.8 kg)  11/11/20 220 lb (99.8 kg)    Physical Exam Vitals and nursing note reviewed.  Constitutional:      General: He is not in acute distress.    Appearance: Normal appearance. He is not ill-appearing, toxic-appearing or diaphoretic.  HENT:     Head: Normocephalic and atraumatic.     Right Ear: External ear normal.     Left Ear: External ear normal.     Nose: Nose normal.     Mouth/Throat:     Mouth: Mucous membranes are moist.     Pharynx: Oropharynx is clear.  Eyes:     General: No scleral icterus.       Right eye: No discharge.        Left eye: No discharge.     Extraocular Movements: Extraocular movements intact.     Conjunctiva/sclera: Conjunctivae normal.     Pupils: Pupils are equal, round, and reactive to light.  Cardiovascular:     Rate and Rhythm: Normal rate and regular rhythm.     Pulses: Normal pulses.     Heart sounds: Normal heart sounds. No murmur heard. No friction rub. No gallop.   Pulmonary:     Effort: Pulmonary effort is normal. No respiratory distress.     Breath sounds: Normal breath sounds. No stridor. No wheezing, rhonchi or rales.  Chest:     Chest wall: No tenderness.  Musculoskeletal:        General: Normal range of motion.     Cervical back: Normal range of motion and neck supple.  Skin:  General: Skin is warm and dry.     Capillary Refill: Capillary refill takes less than 2 seconds.     Coloration: Skin is not jaundiced or pale.     Findings: No bruising, erythema, lesion or rash.  Neurological:     General: No focal deficit present.     Mental Status: He is alert and oriented to person, place, and time. Mental status is at baseline.  Psychiatric:        Mood and Affect: Mood normal.        Behavior: Behavior normal.        Thought Content: Thought content normal.        Judgment: Judgment normal.     Results for orders placed or performed during the hospital encounter of 11/18/20  SARS  CORONAVIRUS 2 (TAT 6-24 HRS) Nasopharyngeal Nasopharyngeal Swab   Specimen: Nasopharyngeal Swab  Result Value Ref Range   SARS Coronavirus 2 POSITIVE (A) NEGATIVE      Assessment & Plan:   Problem List Items Addressed This Visit      Endocrine   Diabetes mellitus with hyperglycemia (HCC) - Primary    Doing well with A1c of 6.4- continue current regimen. Continue to monitor. Call with any concerns. Refills given.       Relevant Medications   rosuvastatin (CRESTOR) 20 MG tablet   metFORMIN (GLUCOPHAGE-XR) 500 MG 24 hr tablet   lisinopril-hydrochlorothiazide (ZESTORETIC) 20-12.5 MG tablet   empagliflozin (JARDIANCE) 25 MG TABS tablet   Other Relevant Orders   Comprehensive metabolic panel   Bayer DCA Hb E7O Waived   Urinalysis, Routine w reflex microscopic     Genitourinary   Benign hypertensive renal disease    Under good control on current regimen. Continue current regimen. Continue to monitor. Call with any concerns. Refills given. Labs drawn today.        Relevant Orders   Comprehensive metabolic panel   CBC with Differential/Platelet   Microalbumin, Urine Waived   TSH   Urinalysis, Routine w reflex microscopic     Other   Hyperlipidemia    Under good control on current regimen. Continue current regimen. Continue to monitor. Call with any concerns. Refills given. Labs drawn today.        Relevant Medications   rosuvastatin (CRESTOR) 20 MG tablet   lisinopril-hydrochlorothiazide (ZESTORETIC) 20-12.5 MG tablet   Other Relevant Orders   Comprehensive metabolic panel   Lipid Panel w/o Chol/HDL Ratio    Other Visit Diagnoses    Screening for prostate cancer       Labs drawn today. Await results.    Relevant Orders   PSA       Follow up plan: Return in about 6 months (around 08/18/2021) for physical.

## 2021-02-15 NOTE — Assessment & Plan Note (Signed)
Under good control on current regimen. Continue current regimen. Continue to monitor. Call with any concerns. Refills given. Labs drawn today.   

## 2021-02-15 NOTE — Assessment & Plan Note (Signed)
Doing well with A1c of 6.4- continue current regimen. Continue to monitor. Call with any concerns. Refills given.

## 2021-02-16 LAB — CBC WITH DIFFERENTIAL/PLATELET
Basophils Absolute: 0 10*3/uL (ref 0.0–0.2)
Basos: 1 %
EOS (ABSOLUTE): 0.2 10*3/uL (ref 0.0–0.4)
Eos: 4 %
Hematocrit: 45.9 % (ref 37.5–51.0)
Hemoglobin: 14.8 g/dL (ref 13.0–17.7)
Immature Grans (Abs): 0 10*3/uL (ref 0.0–0.1)
Immature Granulocytes: 0 %
Lymphocytes Absolute: 1.6 10*3/uL (ref 0.7–3.1)
Lymphs: 39 %
MCH: 28.1 pg (ref 26.6–33.0)
MCHC: 32.2 g/dL (ref 31.5–35.7)
MCV: 87 fL (ref 79–97)
Monocytes Absolute: 0.4 10*3/uL (ref 0.1–0.9)
Monocytes: 11 %
Neutrophils Absolute: 1.9 10*3/uL (ref 1.4–7.0)
Neutrophils: 45 %
Platelets: 205 10*3/uL (ref 150–450)
RBC: 5.27 x10E6/uL (ref 4.14–5.80)
RDW: 13.2 % (ref 11.6–15.4)
WBC: 4.2 10*3/uL (ref 3.4–10.8)

## 2021-02-16 LAB — TSH: TSH: 0.883 u[IU]/mL (ref 0.450–4.500)

## 2021-02-16 LAB — COMPREHENSIVE METABOLIC PANEL
ALT: 18 IU/L (ref 0–44)
AST: 22 IU/L (ref 0–40)
Albumin/Globulin Ratio: 1.8 (ref 1.2–2.2)
Albumin: 4.6 g/dL (ref 3.8–4.9)
Alkaline Phosphatase: 60 IU/L (ref 44–121)
BUN/Creatinine Ratio: 18 (ref 9–20)
BUN: 19 mg/dL (ref 6–24)
Bilirubin Total: 0.3 mg/dL (ref 0.0–1.2)
CO2: 19 mmol/L — ABNORMAL LOW (ref 20–29)
Calcium: 9.5 mg/dL (ref 8.7–10.2)
Chloride: 102 mmol/L (ref 96–106)
Creatinine, Ser: 1.05 mg/dL (ref 0.76–1.27)
Globulin, Total: 2.6 g/dL (ref 1.5–4.5)
Glucose: 105 mg/dL — ABNORMAL HIGH (ref 65–99)
Potassium: 4.1 mmol/L (ref 3.5–5.2)
Sodium: 136 mmol/L (ref 134–144)
Total Protein: 7.2 g/dL (ref 6.0–8.5)
eGFR: 85 mL/min/{1.73_m2} (ref >59)

## 2021-02-16 LAB — LIPID PANEL W/O CHOL/HDL RATIO
Cholesterol, Total: 150 mg/dL (ref 100–199)
HDL: 45 mg/dL (ref >39)
LDL Chol Calc (NIH): 83 mg/dL (ref 0–99)
Triglycerides: 124 mg/dL (ref 0–149)
VLDL Cholesterol Cal: 22 mg/dL (ref 5–40)

## 2021-02-16 LAB — PSA: Prostate Specific Ag, Serum: 0.4 ng/mL (ref 0.0–4.0)

## 2021-02-17 ENCOUNTER — Other Ambulatory Visit: Payer: Self-pay | Admitting: Family Medicine

## 2021-02-17 NOTE — Telephone Encounter (Signed)
Last ordered on 02/15/21 with refills. Refused-not needed.

## 2021-03-13 ENCOUNTER — Other Ambulatory Visit: Payer: Self-pay | Admitting: Family Medicine

## 2021-03-13 ENCOUNTER — Other Ambulatory Visit: Payer: Self-pay | Admitting: Nurse Practitioner

## 2021-04-26 ENCOUNTER — Ambulatory Visit
Admission: EM | Admit: 2021-04-26 | Discharge: 2021-04-26 | Disposition: A | Discharge status: Home / Self Care | Payer: BC Managed Care – PPO | Attending: Emergency Medicine | Admitting: Emergency Medicine

## 2021-04-26 ENCOUNTER — Encounter: Payer: Self-pay | Admitting: Emergency Medicine

## 2021-04-26 ENCOUNTER — Other Ambulatory Visit: Payer: Self-pay

## 2021-04-26 DIAGNOSIS — K0889 Other specified disorders of teeth and supporting structures: Secondary | ICD-10-CM | POA: Diagnosis not present

## 2021-04-26 MED ORDER — IBUPROFEN 800 MG PO TABS: 800.0000 mg | ORAL_TABLET | Freq: Three times a day (TID) | ORAL | 0 refills | Status: DC

## 2021-04-26 MED ORDER — IBUPROFEN 800 MG PO TABS
800.0000 mg | ORAL_TABLET | Freq: Once | ORAL | Status: AC
  Administered 2021-04-26: 800 mg via ORAL

## 2021-04-26 MED ORDER — AMOXICILLIN-POT CLAVULANATE 875-125 MG PO TABS: 1.0000 | ORAL_TABLET | Freq: Two times a day (BID) | ORAL | 0 refills | Status: DC

## 2021-04-26 NOTE — ED Triage Notes (Signed)
Patient c/o left sided dental pain that started over a week ago.  Patient denies fevers.

## 2021-04-26 NOTE — ED Provider Notes (Signed)
[facility]    CSN: [id number] Arrival date & time: 04/26/21  0810      History   Chief Complaint Chief Complaint  Patient presents with  . Dental Pain    HPI Greg Wood is a 50 y.o. male.   Greg Wood presents with complaints of left upper dental pain which started around a week ago and acutely worsened last night. No known fevers. Some left facial swelling. Doesn't follow regularly with a dentist. Took tylenol which hasn't helped with pain. No difficulty opening jaw. Tenderness to touch.     ROS per HPI, negative if not otherwise mentioned.      Past Medical History:  Diagnosis Date  . Acute bronchitis   . Acute lateral meniscus tear of right knee   . Acute medial meniscus tear of right knee   . Allergic rhinitis due to pollen   . Blood in the urine   . Diabetes mellitus without complication ([place])   . Hypercalcemia    Resolved on recheck  . Hyperlipidemia   . Hypertension   . Right medial tibial plateau fracture, closed, initial encounter     Patient Active Problem List   Diagnosis Date Noted  . Noncompliance 01/28/2016  . Blood in the urine   . Allergic rhinitis due to pollen   . Diabetes mellitus with hyperglycemia ([place])   . Hyperlipidemia   . Benign hypertensive renal disease     Past Surgical History:  Procedure Laterality Date  . CHONDROPLASTY Right 08/04/2018   Procedure: CHONDROPLASTY;  Surgeon: Elsie Saas, MD;  Location: [place];  Service: Orthopedics;  Laterality: Right;  . KNEE ARTHROSCOPY WITH LATERAL MENISECTOMY Right 08/04/2018   Procedure: KNEE ARTHROSCOPY WITH LATERAL MENISECTOMY;  Surgeon: Elsie Saas, MD;  Location: [place];  Service: Orthopedics;  Laterality: Right;  . KNEE ARTHROSCOPY WITH MEDIAL MENISECTOMY Right 08/04/2018   Procedure: KNEE ARTHROSCOPY WITH MEDIAL MENISECTOMY;  Surgeon: Elsie Saas, MD;  Location: [place];  Service: Orthopedics;   Laterality: Right;  . KNEE ARTHROSCOPY WITH SUBCHONDROPLASTY Right 08/04/2018   Procedure: CHONDROPLASTY AND SUBCHONDROPLASTY MEDIAL TIBIAL PLATEAU;  Surgeon: Elsie Saas, MD;  Location: [place];  Service: Orthopedics;  Laterality: Right;       Home Medications    Prior to Admission medications   Medication Sig Start Date End Date Taking? Authorizing Provider  amoxicillin-clavulanate (AUGMENTIN) 875-125 MG tablet Take 1 tablet by mouth every 12 (twelve) hours. 04/26/21  Yes Augusto Gamble B, NP  ibuprofen (ADVIL) 800 MG tablet Take 1 tablet (800 mg total) by mouth 3 (three) times daily. 04/26/21  Yes Vasily Fedewa, Lanelle Bal B, NP  JARDIANCE 25 MG TABS tablet TAKE 1 TABLET(25 MG) BY MOUTH DAILY 03/13/21  Yes Johnson, Megan P, DO  lisinopril-hydrochlorothiazide (ZESTORETIC) 20-12.5 MG tablet Take 1 tablet by mouth daily. 02/15/21  Yes Johnson, Megan P, DO  metFORMIN (GLUCOPHAGE-XR) 500 MG 24 hr tablet TAKE 2 TABLETS BY MOUTH TWICE DAILY 03/13/21  Yes Johnson, Megan P, DO  rosuvastatin (CRESTOR) 20 MG tablet Take 1 tablet by mouth daily. 02/15/21  Yes Johnson, Megan P, DO  Blood Glucose Monitoring Suppl (CONTOUR NEXT MONITOR) w/Device KIT USE PER PACKAGE INSTRUCTIONS 12/07/20   [provider]  glucose blood test strip 1 each by Other route as needed for other. Use as instructed    Valerie Roys, DO    Family History Family History  Problem Relation Age of Onset  . Diabetes Mother   .  Diabetes Maternal Grandmother     Social History Social History   Tobacco Use  . Smoking status: Never Smoker  . Smokeless tobacco: Never Used  Vaping Use  . Vaping Use: Never used  Substance Use Topics  . Alcohol use: No  . Drug use: No     Allergies   Patient has no known allergies.   Review of Systems Review of Systems   Physical Exam Triage Vital Signs ED Triage Vitals  Enc Vitals Group     BP 04/26/21 0825 131/85     Pulse Rate 04/26/21 0825 66     Resp 04/26/21  0825 16     Temp 04/26/21 0825 98.4 F (36.9 C)     Temp Source 04/26/21 0825 Oral     SpO2 04/26/21 0825 99 %     Weight 04/26/21 0823 220 lb (99.8 kg)     Height 04/26/21 0823 6' (1.829 m)     Head Circumference --      Peak Flow --      Pain Score 04/26/21 0822 8     Pain Loc --      Pain Edu? --      Excl. in [place]? --    No data found.  Updated Vital Signs BP 131/85 (BP Location: Right Arm)   Pulse 66   Temp 98.4 F (36.9 C) (Oral)   Resp 16   Ht 6' (1.829 m)   Wt 220 lb (99.8 kg)   SpO2 99%   BMI 29.84 kg/m    Physical Exam Constitutional:      Appearance: He is well-developed.  HENT:     Head:     Jaw: There is normal jaw occlusion. No trismus.     Mouth/Throat:     Mouth: Mucous membranes are moist.     Dentition: Dental tenderness present.     Pharynx: Oropharynx is clear.      Comments: Left upper gumline and jaw line with tenderness without visible abscess; previously filled cavities noted without otherwise any gross abnormalities Cardiovascular:     Rate and Rhythm: Normal rate.  Pulmonary:     Effort: Pulmonary effort is normal.  Skin:    General: Skin is warm and dry.  Neurological:     Mental Status: He is alert and oriented to person, place, and time.      UC Treatments / Results  Labs (all labs ordered are listed, but only abnormal results are displayed) Labs Reviewed - No data to display  EKG   Radiology No results found.  Procedures Procedures (including critical care time)  Medications Ordered in UC Medications  ibuprofen (ADVIL) tablet 800 mg (800 mg Oral Given 04/26/21 0841)    Initial Impression / Assessment and Plan / UC Course  I have reviewed the triage vital signs and the nursing notes.  Pertinent labs & imaging results that were available during my care of the patient were reviewed by me and considered in my medical decision making (see chart for details).     Worsening dental pain without red flag findings.  Antibiotics and pain management provided, emphasized dental follow up. Patient verbalized understanding and agreeable to plan.   Final Clinical Impressions(s) / UC Diagnoses   Final diagnoses:  Pain, dental     Discharge Instructions     Please reach out today to set up a dental appointment for definitive treatment.  Ibuprofen every 8 hours to help with pain, take with food and drink plenty  of water.  Complete course of antibiotics.     ED Prescriptions    Medication Sig Dispense Auth. Provider   ibuprofen (ADVIL) 800 MG tablet Take 1 tablet (800 mg total) by mouth 3 (three) times daily. 30 tablet Augusto Gamble B, NP   amoxicillin-clavulanate (AUGMENTIN) 875-125 MG tablet Take 1 tablet by mouth every 12 (twelve) hours. 14 tablet Zigmund Gottron, NP     PDMP not reviewed this encounter.   Zigmund Gottron, NP 04/26/21 [phone]

## 2021-04-26 NOTE — Discharge Instructions (Addendum)
Please reach out today to set up a dental appointment for definitive treatment.  Ibuprofen every 8 hours to help with pain, take with food and drink plenty of water.  Complete course of antibiotics.

## 2021-06-21 ENCOUNTER — Other Ambulatory Visit: Payer: Self-pay | Admitting: Family Medicine

## 2021-08-22 ENCOUNTER — Other Ambulatory Visit: Payer: Self-pay | Admitting: Family Medicine

## 2021-08-22 NOTE — Telephone Encounter (Signed)
I called pt this morning and left a voicemail for him to call in and make an 6 mo check up appt.   A 30 day courtesy supply was given by Dr. Laural Benes earlier today for the Zestoretic 20/12.5 mg

## 2021-08-22 NOTE — Telephone Encounter (Signed)
I called pt and left a voicemail for him to call in and make an appt for his 6 mo. Check up.  He has been given a 30 day courtesy supply of the Zestoretic 20/12.5 mg on 08/22/2021.

## 2021-08-23 ENCOUNTER — Encounter: Payer: BC Managed Care – PPO | Admitting: Family Medicine

## 2021-09-05 DIAGNOSIS — Z23 Encounter for immunization: Secondary | ICD-10-CM | POA: Diagnosis not present

## 2021-09-24 ENCOUNTER — Other Ambulatory Visit: Payer: Self-pay | Admitting: Family Medicine

## 2021-09-24 NOTE — Telephone Encounter (Signed)
Lmom asking pt to call back to schedule an appt. °

## 2021-09-24 NOTE — Telephone Encounter (Signed)
Requested medications are due for refill today yes  Requested medications are on the active medication list yes  Last refill 08/22/21  Last visit 02/15/21  Future visit scheduled NO, already had curtesy refill on 08/22/21, canceled appt on 08/23/21.  Notes to clinic Has already had a curtesy refill and there is no upcoming appointment scheduled.  Requested Prescriptions  Pending Prescriptions Disp Refills   lisinopril-hydrochlorothiazide (ZESTORETIC) 20-12.5 MG tablet [Pharmacy Med Name: LISINOPRIL-HCTZ 20/12.5MG  TABLETS] 30 tablet 0    Sig: TAKE 1 TABLET BY MOUTH DAILY     Cardiovascular:  ACEI + Diuretic Combos Failed - 09/24/2021  3:18 AM      Failed - Na in normal range and within 180 days    Sodium  Date Value Ref Range Status  02/15/2021 136 134 - 144 mmol/L Final          Failed - K in normal range and within 180 days    Potassium  Date Value Ref Range Status  02/15/2021 4.1 3.5 - 5.2 mmol/L Final          Failed - Cr in normal range and within 180 days    Creatinine, Ser  Date Value Ref Range Status  02/15/2021 1.05 0.76 - 1.27 mg/dL Final          Failed - Ca in normal range and within 180 days    Calcium  Date Value Ref Range Status  02/15/2021 9.5 8.7 - 10.2 mg/dL Final          Failed - Valid encounter within last 6 months    Recent Outpatient Visits           7 months ago Type 2 diabetes mellitus with hyperglycemia, without long-term current use of insulin (HCC)   [facility] [place], Megan P, DO   1 year ago Type 2 diabetes mellitus with hyperglycemia, without long-term current use of insulin (HCC)   [facility] Valentino Nose, NP   1 year ago Type 2 diabetes mellitus with hyperglycemia, without long-term current use of insulin (HCC)   [facility] [place], Megan P, DO   2 years ago Type 2 diabetes mellitus with hyperglycemia, without long-term current use of insulin (HCC)   [facility] [place],  Megan P, DO   3 years ago Type 2 diabetes mellitus with hyperglycemia, without long-term current use of insulin [facility])   [facility] [place], [place], DO              Passed - Patient is not pregnant      Passed - Last BP in normal range    BP Readings from Last 1 Encounters:  04/26/21 131/85

## 2021-09-24 NOTE — Telephone Encounter (Signed)
Needs appt

## 2021-09-27 ENCOUNTER — Other Ambulatory Visit: Payer: Self-pay | Admitting: Family Medicine

## 2021-09-27 MED ORDER — ROSUVASTATIN CALCIUM 20 MG PO TABS: ORAL_TABLET | ORAL | 0 refills | Status: DC

## 2021-09-27 MED ORDER — EMPAGLIFLOZIN 25 MG PO TABS: ORAL_TABLET | ORAL | 0 refills | Status: DC

## 2021-09-27 MED ORDER — METFORMIN HCL ER 500 MG PO TB24: 1000.0000 mg | ORAL_TABLET | Freq: Two times a day (BID) | ORAL | 0 refills | Status: DC

## 2021-09-27 MED ORDER — LISINOPRIL-HYDROCHLOROTHIAZIDE 20-12.5 MG PO TABS: 1.0000 | ORAL_TABLET | Freq: Every day | ORAL | 0 refills | Status: DC

## 2021-09-27 NOTE — Telephone Encounter (Signed)
Medication: JARDIANCE 25 MG TABS tablet [[id number]]   Has the patient contacted their pharmacy? YES  (Agent: If no, request that the patient contact the pharmacy for the refill. If patient does not wish to contact the pharmacy document the reason why and proceed with request.) (Agent: If yes, when and what did the pharmacy advise?)   Preferred Pharmacy (with phone number or street name): [id number] [place], [place] - [address] AT [facility] [address] [place] [address] [place] [address] Phone: [phone] Fax: [phone] Hours: Not open 24 hours    Has the patient been seen for an appointment in the last year OR does the patient have an upcoming appointment? Yes 10/11/21  Agent: Please be advised that RX refills may take up to 3 business days. We ask that you follow-up with your pharmacy.

## 2021-09-27 NOTE — Telephone Encounter (Signed)
Upcoming appointment 10/11/21

## 2021-09-27 NOTE — Telephone Encounter (Signed)
Requested medication (s) are due for refill today: Yes  Requested medication (s) are on the active medication list: Yes  Last refill:  06/21/21 #30/2RF  Future visit scheduled: Yes  Notes to clinic:  Unable to refill per protocol due to failed labs, no updated results. Pt has upcoming appt 10/11/21.     Requested Prescriptions  Pending Prescriptions Disp Refills   empagliflozin (JARDIANCE) 25 MG TABS tablet 30 tablet 2     Endocrinology:  Diabetes - SGLT2 Inhibitors Failed - 09/27/2021 12:49 PM      Failed - HBA1C is between 0 and 7.9 and within 180 days    HB A1C (BAYER DCA - WAIVED)  Date Value Ref Range Status  02/15/2021 6.4 <7.0 % Final    Comment:                                          Diabetic Adult            <7.0                                       Healthy Adult        4.3 - 5.7                                                           (DCCT/NGSP) American Diabetes Association's Summary of Glycemic Recommendations for Adults with Diabetes: Hemoglobin A1c <7.0%. More stringent glycemic goals (A1c <6.0%) may further reduce complications at the cost of increased risk of hypoglycemia.           Failed - AA eGFR in normal range and within 360 days    GFR calc Af Amer  Date Value Ref Range Status  06/29/2020 116 >59 mL/min/1.73 Final    Comment:    **[facility] currently reports eGFR in compliance with the current**   recommendations of the [employer]. [facility] will   update reporting as new guidelines are published from the NKF-ASN   Task force.    GFR calc non Af Amer  Date Value Ref Range Status  06/29/2020 100 >59 mL/min/1.73 Final   eGFR  Date Value Ref Range Status  02/15/2021 85 >59 mL/min/1.73 Final          Failed - Valid encounter within last 6 months    Recent Outpatient Visits           7 months ago Type 2 diabetes mellitus with hyperglycemia, without long-term current use of insulin ([place])   [place],  Megan P, DO   1 year ago Type 2 diabetes mellitus with hyperglycemia, without long-term current use of insulin ([place])   [facility] Eulogio Bear, NP   1 year ago Type 2 diabetes mellitus with hyperglycemia, without long-term current use of insulin ([place])   [place], Megan P, DO   2 years ago Type 2 diabetes mellitus with hyperglycemia, without long-term current use of insulin ([place])   [place], Megan P, DO   3 years ago Type 2 diabetes mellitus with hyperglycemia, without long-term current use  of insulin [facility])   [place], [place], DO       Future Appointments             In 2 weeks Johnson, Megan P, DO [facility]            Passed - Cr in normal range and within 360 days    Creatinine, Ser  Date Value Ref Range Status  02/15/2021 1.05 0.76 - 1.27 mg/dL Final          Passed - LDL in normal range and within 360 days    LDL Chol Calc (NIH)  Date Value Ref Range Status  02/15/2021 83 0 - 99 mg/dL Final

## 2021-09-28 NOTE — Telephone Encounter (Signed)
Spoke with [facility] and advised last refill- Was advised to disregard all refill requests.  All were refilled yesterday. Requested Prescriptions  Pending Prescriptions Disp Refills   rosuvastatin (CRESTOR) 20 MG tablet [Pharmacy Med Name: ROSUVASTATIN 20MG  TABLETS] 90 tablet     Sig: TAKE 1 TABLET BY MOUTH DAILY     Cardiovascular:  Antilipid - Statins Passed - 09/27/2021  3:48 PM      Passed - Total Cholesterol in normal range and within 360 days    Cholesterol, Total  Date Value Ref Range Status  02/15/2021 150 100 - 199 mg/dL Final   Cholesterol Piccolo, Waived  Date Value Ref Range Status  06/03/2017 WILL FOLLOW  Preliminary          Passed - LDL in normal range and within 360 days    LDL Chol Calc (NIH)  Date Value Ref Range Status  02/15/2021 83 0 - 99 mg/dL Final          Passed - HDL in normal range and within 360 days    HDL  Date Value Ref Range Status  02/15/2021 45 >39 mg/dL Final          Passed - Triglycerides in normal range and within 360 days    Triglycerides  Date Value Ref Range Status  02/15/2021 124 0 - 149 mg/dL Final   Triglycerides Piccolo,Waived  Date Value Ref Range Status  06/03/2017 WILL FOLLOW  Preliminary          Passed - Patient is not pregnant      Passed - Valid encounter within last 12 months    Recent Outpatient Visits           7 months ago Type 2 diabetes mellitus with hyperglycemia, without long-term current use of insulin ([place])   [place], Megan P, DO   1 year ago Type 2 diabetes mellitus with hyperglycemia, without long-term current use of insulin ([place])   [place] Eulogio Bear, NP   1 year ago Type 2 diabetes mellitus with hyperglycemia, without long-term current use of insulin ([place])   [place], Megan P, DO   2 years ago Type 2 diabetes mellitus with hyperglycemia, without long-term current use of insulin ([place])   [place], Megan P, DO   3 years ago Type 2 diabetes mellitus with hyperglycemia, without long-term current use of insulin ([place])   [place], [place], DO       Future Appointments             In 1 week Johnson, Megan P, DO [facility]             lisinopril-hydrochlorothiazide (ZESTORETIC) 20-12.5 MG tablet [employer] Med Name: LISINOPRIL-HCTZ 20/12.5MG  TABLETS] 90 tablet     Sig: TAKE 1 TABLET BY MOUTH DAILY     Cardiovascular:  ACEI + Diuretic Combos Failed - 09/27/2021  3:48 PM      Failed - Na in normal range and within 180 days    Sodium  Date Value Ref Range Status  02/15/2021 136 134 - 144 mmol/L Final          Failed - K in normal range and within 180 days    Potassium  Date Value Ref Range Status  02/15/2021 4.1 3.5 - 5.2 mmol/L Final          Failed - Cr in normal range and within 180 days  Creatinine, Ser  Date Value Ref Range Status  02/15/2021 1.05 0.76 - 1.27 mg/dL Final          Failed - Ca in normal range and within 180 days    Calcium  Date Value Ref Range Status  02/15/2021 9.5 8.7 - 10.2 mg/dL Final          Failed - Valid encounter within last 6 months    Recent Outpatient Visits           7 months ago Type 2 diabetes mellitus with hyperglycemia, without long-term current use of insulin ([place])   [place], Megan P, DO   1 year ago Type 2 diabetes mellitus with hyperglycemia, without long-term current use of insulin ([place])   [place] Eulogio Bear, NP   1 year ago Type 2 diabetes mellitus with hyperglycemia, without long-term current use of insulin ([place])   [place], Megan P, DO   2 years ago Type 2 diabetes mellitus with hyperglycemia, without long-term current use of insulin ([place])   [place], Megan P, DO   3 years ago Type 2 diabetes mellitus with hyperglycemia, without long-term current use of insulin ([place])    [place], [place], DO       Future Appointments             In 1 week Johnson, Megan P, DO [place], [place] - Patient is not pregnant      Passed - Last BP in normal range    BP Readings from Last 1 Encounters:  04/26/21 131/85           metFORMIN (GLUCOPHAGE-XR) 500 MG 24 hr tablet [Pharmacy Med Name: METFORMIN ER $RemoveBefo'500MG'[id number]$  24HR TABS] 360 tablet     Sig: TAKE 2 TABLETS(1000 MG) BY MOUTH TWICE DAILY     Endocrinology:  Diabetes - Biguanides Failed - 09/27/2021  3:48 PM      Failed - HBA1C is between 0 and 7.9 and within 180 days    HB A1C (BAYER DCA - WAIVED)  Date Value Ref Range Status  02/15/2021 6.4 <7.0 % Final    Comment:                                          Diabetic Adult            <7.0                                       Healthy Adult        4.3 - 5.7                                                           (DCCT/NGSP) American Diabetes Association's Summary of Glycemic Recommendations for Adults with Diabetes: Hemoglobin A1c <7.0%. More stringent glycemic goals (A1c <6.0%) may further reduce complications at the cost of increased risk of hypoglycemia.           Failed - AA eGFR in  normal range and within 360 days    GFR calc Af Amer  Date Value Ref Range Status  06/29/2020 116 >59 mL/min/1.73 Final    Comment:    **[facility] currently reports eGFR in compliance with the current**   recommendations of the [employer]. [facility] will   update reporting as new guidelines are published from the NKF-ASN   Task force.    GFR calc non Af Amer  Date Value Ref Range Status  06/29/2020 100 >59 mL/min/1.73 Final   eGFR  Date Value Ref Range Status  02/15/2021 85 >59 mL/min/1.73 Final          Failed - Valid encounter within last 6 months    Recent Outpatient Visits           7 months ago Type 2 diabetes mellitus with hyperglycemia, without long-term current use of insulin ([place])    [place], Megan P, DO   1 year ago Type 2 diabetes mellitus with hyperglycemia, without long-term current use of insulin ([place])   [facility] Eulogio Bear, NP   1 year ago Type 2 diabetes mellitus with hyperglycemia, without long-term current use of insulin ([place])   [place], Megan P, DO   2 years ago Type 2 diabetes mellitus with hyperglycemia, without long-term current use of insulin ([place])   [place], Megan P, DO   3 years ago Type 2 diabetes mellitus with hyperglycemia, without long-term current use of insulin ([place])   [place], [place], DO       Future Appointments             In 1 week Johnson, Megan P, DO [facility]            Passed - Cr in normal range and within 360 days    Creatinine, Ser  Date Value Ref Range Status  02/15/2021 1.05 0.76 - 1.27 mg/dL Final

## 2021-10-11 ENCOUNTER — Other Ambulatory Visit: Payer: Self-pay

## 2021-10-11 ENCOUNTER — Ambulatory Visit: Payer: BC Managed Care – PPO | Admitting: Family Medicine

## 2021-10-11 ENCOUNTER — Encounter: Payer: Self-pay | Admitting: Family Medicine

## 2021-10-11 VITALS — BP 131/83 | HR 71 | Temp 99.2°F | Ht 72.0 in | Wt 218.8 lb

## 2021-10-11 DIAGNOSIS — E1165 Type 2 diabetes mellitus with hyperglycemia: Secondary | ICD-10-CM

## 2021-10-11 DIAGNOSIS — Z0000 Encounter for general adult medical examination without abnormal findings: Secondary | ICD-10-CM

## 2021-10-11 DIAGNOSIS — H6121 Impacted cerumen, right ear: Secondary | ICD-10-CM

## 2021-10-11 DIAGNOSIS — E782 Mixed hyperlipidemia: Secondary | ICD-10-CM

## 2021-10-11 DIAGNOSIS — Z23 Encounter for immunization: Secondary | ICD-10-CM

## 2021-10-11 DIAGNOSIS — I129 Hypertensive chronic kidney disease with stage 1 through stage 4 chronic kidney disease, or unspecified chronic kidney disease: Secondary | ICD-10-CM

## 2021-10-11 DIAGNOSIS — Z1211 Encounter for screening for malignant neoplasm of colon: Secondary | ICD-10-CM

## 2021-10-11 DIAGNOSIS — Z1159 Encounter for screening for other viral diseases: Secondary | ICD-10-CM

## 2021-10-11 LAB — MICROALBUMIN, URINE WAIVED
Creatinine, Urine Waived: 50 mg/dL (ref 10–300)
Microalb, Ur Waived: 10 mg/L (ref 0–19)
Microalb/Creat Ratio: <30 mg/g (ref <30)

## 2021-10-11 LAB — BAYER DCA HB A1C WAIVED: HB A1C (BAYER DCA - WAIVED): 6 % — ABNORMAL HIGH (ref 4.8–5.6)

## 2021-10-11 MED ORDER — LISINOPRIL-HYDROCHLOROTHIAZIDE 20-12.5 MG PO TABS: 1.0000 | ORAL_TABLET | Freq: Every day | ORAL | 1 refills | Status: DC

## 2021-10-11 MED ORDER — METFORMIN HCL ER 500 MG PO TB24: 1000.0000 mg | ORAL_TABLET | Freq: Two times a day (BID) | ORAL | 1 refills | Status: DC

## 2021-10-11 MED ORDER — ROSUVASTATIN CALCIUM 20 MG PO TABS: ORAL_TABLET | ORAL | 1 refills | Status: DC

## 2021-10-11 MED ORDER — EMPAGLIFLOZIN 25 MG PO TABS: ORAL_TABLET | ORAL | 1 refills | Status: DC

## 2021-10-11 MED ORDER — AMOXICILLIN-POT CLAVULANATE 875-125 MG PO TABS: 1.0000 | ORAL_TABLET | Freq: Two times a day (BID) | ORAL | 0 refills | Status: DC

## 2021-10-11 NOTE — Assessment & Plan Note (Signed)
Under good control on current regimen. Continue current regimen. Continue to monitor. Call with any concerns. Refills given. Labs drawn today.   

## 2021-10-11 NOTE — Progress Notes (Signed)
BP 131/83   Pulse 71   Temp 99.2 F (37.3 C)   Ht 6' (1.829 m)   Wt 218 lb 12.8 oz (99.2 kg)   SpO2 98%   BMI 29.67 kg/m    Subjective:    Patient ID: Greg Wood, male    DOB: 1971-11-04, 50 y.o.   MRN: [id number]  HPI: Greg Wood is a 50 y.o. male presenting on 10/11/2021 for comprehensive medical examination. Current medical complaints include:  HYPERTENSION / HYPERLIPIDEMIA Satisfied with current treatment? yes Duration of hypertension: chronic BP medication side effects: no Past BP meds: lisinopril-HCTZ Duration of hyperlipidemia: chronic Cholesterol medication side effects: no Cholesterol supplements: none Past cholesterol medications: atorvastatin Medication compliance: excellent compliance Aspirin: no Recent stressors: no Recurrent headaches: no Visual changes: no Palpitations: no Dyspnea: no Chest pain: no Lower extremity edema: no Dizzy/lightheaded: no  DIABETES Hypoglycemic episodes:no Polydipsia/polyuria: no Visual disturbance: no Chest pain: no Paresthesias: no Glucose Monitoring: no  Accucheck frequency: Not Checking Taking Insulin?: no Blood Pressure Monitoring: not checking Retinal Examination: Not up to Date Foot Exam: Up to Date Diabetic Education: Completed Pneumovax: Up to Date Influenza: Up to Date Aspirin: no  DENTAL PAIN Duration: months Involved teeth: L upper Dentist evaluation: yes Mechanism of injury:  no trauma Onset: sudden Aggravating factors: cold, heat, hard foods, and chewing Alleviating factors: nothing Status: worse Treatments attempted:  abx and NSAIDs Relief with NSAIDs?: mild Fevers: no Swelling: yes Redness: yes Paresthesias / decreased sensation: no Sinus pressure: no  Interim Problems from his last visit: no  Depression Screen done today and results listed below:  Depression screen [facility] 2/9 10/11/2021 06/29/2020 10/14/2019 09/03/2018 06/03/2017  Decreased Interest 0 0 0 0 0  Down, Depressed,  Hopeless 0 0 0 0 0  PHQ - 2 Score 0 0 0 0 0  Altered sleeping 0 0 0 0 -  Tired, decreased energy 0 0 0 0 -  Change in appetite 0 0 0 0 -  Feeling bad or failure about yourself  0 0 0 0 -  Trouble concentrating 0 0 0 0 -  Moving slowly or fidgety/restless 0 0 0 0 -  Suicidal thoughts 0 0 0 0 -  PHQ-9 Score 0 0 0 0 -  Difficult doing work/chores - - Not difficult at all - -    Past Medical History:  Past Medical History:  Diagnosis Date   Acute bronchitis    Acute lateral meniscus tear of right knee    Acute medial meniscus tear of right knee    Allergic rhinitis due to pollen    Blood in the urine    Diabetes mellitus without complication (HCC)    Hypercalcemia    Resolved on recheck   Hyperlipidemia    Hypertension    Right medial tibial plateau fracture, closed, initial encounter     Surgical History:  Past Surgical History:  Procedure Laterality Date   CHONDROPLASTY Right 08/04/2018   Procedure: CHONDROPLASTY;  Surgeon: Elsie Saas, MD;  Location: [place];  Service: Orthopedics;  Laterality: Right;   KNEE ARTHROSCOPY WITH LATERAL MENISECTOMY Right 08/04/2018   Procedure: KNEE ARTHROSCOPY WITH LATERAL MENISECTOMY;  Surgeon: Elsie Saas, MD;  Location: [facility];  Service: Orthopedics;  Laterality: Right;   KNEE ARTHROSCOPY WITH MEDIAL MENISECTOMY Right 08/04/2018   Procedure: KNEE ARTHROSCOPY WITH MEDIAL MENISECTOMY;  Surgeon: Elsie Saas, MD;  Location: [facility];  Service: Orthopedics;  Laterality: Right;   KNEE ARTHROSCOPY  WITH SUBCHONDROPLASTY Right 08/04/2018   Procedure: CHONDROPLASTY AND SUBCHONDROPLASTY MEDIAL TIBIAL PLATEAU;  Surgeon: Elsie Saas, MD;  Location: [facility];  Service: Orthopedics;  Laterality: Right;    Medications:  Current Outpatient Medications on File Prior to Visit  Medication Sig   Blood Glucose Monitoring Suppl (CONTOUR NEXT MONITOR) w/Device KIT USE PER PACKAGE  INSTRUCTIONS   glucose blood test strip 1 each by Other route as needed for other. Use as instructed   ibuprofen (ADVIL) 800 MG tablet Take 1 tablet (800 mg total) by mouth 3 (three) times daily.   No current facility-administered medications on file prior to visit.    Allergies:  No Known Allergies  Social History:  Social History   Socioeconomic History   Marital status: Single    Spouse name: Not on file   Number of children: Not on file   Years of education: Not on file   Highest education level: Not on file  Occupational History   Not on file  Tobacco Use   Smoking status: Never   Smokeless tobacco: Never  Vaping Use   Vaping Use: Never used  Substance and Sexual Activity   Alcohol use: No   Drug use: No   Sexual activity: Yes    Birth control/protection: None  Other Topics Concern   Not on file  Social History Narrative   Not on file   Social Determinants of Health   Financial Resource Strain: Not on file  Food Insecurity: Not on file  Transportation Needs: Not on file  Physical Activity: Not on file  Stress: Not on file  Social Connections: Not on file  Intimate Partner Violence: Not on file   Social History   Tobacco Use  Smoking Status Never  Smokeless Tobacco Never   Social History   Substance and Sexual Activity  Alcohol Use No    Family History:  Family History  Problem Relation Age of Onset   Diabetes Mother    Diabetes Maternal Grandmother     Past medical history, surgical history, medications, allergies, family history and social history reviewed with patient today and changes made to appropriate areas of the chart.   Review of Systems  Constitutional: Negative.   HENT: Negative.    Eyes: Negative.   Respiratory: Negative.    Cardiovascular: Negative.   Gastrointestinal: Negative.   Genitourinary: Negative.   Musculoskeletal: Negative.   Skin: Negative.   Neurological: Negative.   Endo/Heme/Allergies: Negative.    Psychiatric/Behavioral: Negative.    All other ROS negative except what is listed above and in the HPI.      Objective:    BP 131/83   Pulse 71   Temp 99.2 F (37.3 C)   Ht 6' (1.829 m)   Wt 218 lb 12.8 oz (99.2 kg)   SpO2 98%   BMI 29.67 kg/m   Wt Readings from Last 3 Encounters:  10/11/21 218 lb 12.8 oz (99.2 kg)  04/26/21 220 lb (99.8 kg)  02/15/21 221 lb (100.2 kg)    Physical Exam Vitals and nursing note reviewed.  Constitutional:      General: He is not in acute distress.    Appearance: Normal appearance. He is normal weight. He is not ill-appearing, toxic-appearing or diaphoretic.  HENT:     Head: Normocephalic and atraumatic.     Right Ear: Tympanic membrane, ear canal and external ear normal. There is impacted cerumen.     Left Ear: Tympanic membrane, ear canal and external ear normal.  There is no impacted cerumen.     Nose: Nose normal. No congestion or rhinorrhea.     Mouth/Throat:     Mouth: Mucous membranes are moist.     Pharynx: Oropharynx is clear. No oropharyngeal exudate or posterior oropharyngeal erythema.  Eyes:     General: No scleral icterus.       Right eye: No discharge.        Left eye: No discharge.     Extraocular Movements: Extraocular movements intact.     Conjunctiva/sclera: Conjunctivae normal.     Pupils: Pupils are equal, round, and reactive to light.  Neck:     Vascular: No carotid bruit.  Cardiovascular:     Rate and Rhythm: Normal rate and regular rhythm.     Pulses: Normal pulses.     Heart sounds: No murmur heard.   No friction rub. No gallop.  Pulmonary:     Effort: Pulmonary effort is normal. No respiratory distress.     Breath sounds: Normal breath sounds. No stridor. No wheezing, rhonchi or rales.  Chest:     Chest wall: No tenderness.  Abdominal:     General: Abdomen is flat. Bowel sounds are normal. There is no distension.     Palpations: Abdomen is soft. There is no mass.     Tenderness: There is no abdominal  tenderness. There is no right CVA tenderness, left CVA tenderness, guarding or rebound.     Hernia: No hernia is present.  Genitourinary:    Comments: Genital exam deferred with shared decision making Musculoskeletal:        General: No swelling, tenderness, deformity or signs of injury.     Cervical back: Normal range of motion and neck supple. No rigidity. No muscular tenderness.     Right lower leg: No edema.     Left lower leg: No edema.  Lymphadenopathy:     Cervical: No cervical adenopathy.  Skin:    General: Skin is warm and dry.     Capillary Refill: Capillary refill takes less than 2 seconds.     Coloration: Skin is not jaundiced or pale.     Findings: No bruising, erythema, lesion or rash.  Neurological:     General: No focal deficit present.     Mental Status: He is alert and oriented to person, place, and time.     Cranial Nerves: No cranial nerve deficit.     Sensory: No sensory deficit.     Motor: No weakness.     Coordination: Coordination normal.     Gait: Gait normal.     Deep Tendon Reflexes: Reflexes normal.  Psychiatric:        Mood and Affect: Mood normal.        Behavior: Behavior normal.        Thought Content: Thought content normal.        Judgment: Judgment normal.    Results for orders placed or performed in visit on 02/15/21  Comprehensive metabolic panel  Result Value Ref Range   Glucose 105 (H) 65 - 99 mg/dL   BUN 19 6 - 24 mg/dL   Creatinine, Ser 1.05 0.76 - 1.27 mg/dL   eGFR 85 >59 mL/min/1.73   BUN/Creatinine Ratio 18 9 - 20   Sodium 136 134 - 144 mmol/L   Potassium 4.1 3.5 - 5.2 mmol/L   Chloride 102 96 - 106 mmol/L   CO2 19 (L) 20 - 29 mmol/L   Calcium 9.5 8.7 - 10.2 mg/dL  Total Protein 7.2 6.0 - 8.5 g/dL   Albumin 4.6 3.8 - 4.9 g/dL   Globulin, Total 2.6 1.5 - 4.5 g/dL   Albumin/Globulin Ratio 1.8 1.2 - 2.2   Bilirubin Total 0.3 0.0 - 1.2 mg/dL   Alkaline Phosphatase 60 44 - 121 IU/L   AST 22 0 - 40 IU/L   ALT 18 0 - 44 IU/L   Bayer DCA Hb A1c Waived  Result Value Ref Range   HB A1C (BAYER DCA - WAIVED) 6.4 <7.0 %  CBC with Differential/Platelet  Result Value Ref Range   WBC 4.2 3.4 - 10.8 x10E3/uL   RBC 5.27 4.14 - 5.80 x10E6/uL   Hemoglobin 14.8 13.0 - 17.7 g/dL   Hematocrit 45.9 37.5 - 51.0 %   MCV 87 79 - 97 fL   MCH 28.1 26.6 - 33.0 pg   MCHC 32.2 31.5 - 35.7 g/dL   RDW 13.2 11.6 - 15.4 %   Platelets 205 150 - 450 x10E3/uL   Neutrophils 45 Not Estab. %   Lymphs 39 Not Estab. %   Monocytes 11 Not Estab. %   Eos 4 Not Estab. %   Basos 1 Not Estab. %   Neutrophils Absolute 1.9 1.4 - 7.0 x10E3/uL   Lymphocytes Absolute 1.6 0.7 - 3.1 x10E3/uL   Monocytes Absolute 0.4 0.1 - 0.9 x10E3/uL   EOS (ABSOLUTE) 0.2 0.0 - 0.4 x10E3/uL   Basophils Absolute 0.0 0.0 - 0.2 x10E3/uL   Immature Granulocytes 0 Not Estab. %   Immature Grans (Abs) 0.0 0.0 - 0.1 x10E3/uL  Lipid Panel w/o Chol/HDL Ratio  Result Value Ref Range   Cholesterol, Total 150 100 - 199 mg/dL   Triglycerides 124 0 - 149 mg/dL   HDL 45 >39 mg/dL   VLDL Cholesterol Cal 22 5 - 40 mg/dL   LDL Chol Calc (NIH) 83 0 - 99 mg/dL  Microalbumin, Urine Waived  Result Value Ref Range   Microalb, Ur Waived 30 (H) 0 - 19 mg/L   Creatinine, Urine Waived 200 10 - 300 mg/dL   Microalb/Creat Ratio <30 <30 mg/g  PSA  Result Value Ref Range   Prostate Specific Ag, Serum 0.4 0.0 - 4.0 ng/mL  TSH  Result Value Ref Range   TSH 0.883 0.450 - 4.500 uIU/mL  Urinalysis, Routine w reflex microscopic  Result Value Ref Range   Specific Gravity, UA 1.020 1.005 - 1.030   pH, UA 7.0 5.0 - 7.5   Color, UA Yellow Yellow   Appearance Ur Clear Clear   Leukocytes,UA Negative Negative   Protein,UA Negative Negative/Trace   Glucose, UA 3+ (A) Negative   Ketones, UA Negative Negative   RBC, UA Negative Negative   Bilirubin, UA Negative Negative   Urobilinogen, Ur 0.2 0.2 - 1.0 mg/dL   Nitrite, UA Negative Negative      Assessment & Plan:   Problem List Items  Addressed This Visit       Endocrine   Diabetes mellitus with hyperglycemia ([place])    Doing well with A1c of 6.0. Continue current regimen. Continue to monitor. Call with any concerns.       Relevant Medications   empagliflozin (JARDIANCE) 25 MG TABS tablet   lisinopril-hydrochlorothiazide (ZESTORETIC) 20-12.5 MG tablet   metFORMIN (GLUCOPHAGE-XR) 500 MG 24 hr tablet   rosuvastatin (CRESTOR) 20 MG tablet   Other Relevant Orders   Bayer DCA Hb A1c Waived   Comprehensive metabolic panel   CBC with Differential/Platelet   Microalbumin, Urine Waived  Ambulatory referral to Ophthalmology     Genitourinary   Benign hypertensive renal disease    Under good control on current regimen. Continue current regimen. Continue to monitor. Call with any concerns. Refills given. Labs drawn today.        Relevant Orders   Comprehensive metabolic panel   CBC with Differential/Platelet   Microalbumin, Urine Waived     Other   Hyperlipidemia    Under good control on current regimen. Continue current regimen. Continue to monitor. Call with any concerns. Refills given. Labs drawn today.        Relevant Medications   lisinopril-hydrochlorothiazide (ZESTORETIC) 20-12.5 MG tablet   rosuvastatin (CRESTOR) 20 MG tablet   Other Relevant Orders   Comprehensive metabolic panel   CBC with Differential/Platelet   Lipid Panel w/o Chol/HDL Ratio   Other Visit Diagnoses     Routine general medical examination at a health care facility    -  Primary   Vaccines up to date. Screening labs checked today/last visit. Cologuard ordered. Continue diet and exercise. Call with any concerns.    Hearing loss due to cerumen impaction, right       Flushed today with good results.    Need for hepatitis C screening test       Labs drawn today. Await results.    Relevant Orders   Hepatitis C Antibody   Screening for colon cancer       Cologuard ordered today.    Relevant Orders   Cologuard        Discussed  aspirin prophylaxis for myocardial infarction prevention and decision was it was not indicated  LABORATORY TESTING:  Health maintenance labs ordered today as discussed above.   IMMUNIZATIONS:   - Tdap: Tetanus vaccination status reviewed: Td vaccination indicated and given today. - Influenza: Up to date - Pneumovax: Up to date - Prevnar: Not applicable - COVID: Up to date - HPV: Not applicable - Shingrix vaccine: Given elsewhere  SCREENING: - Colonoscopy: Ordered today  Discussed with patient purpose of the colonoscopy is to detect colon cancer at curable precancerous or early stages    PATIENT COUNSELING:    Sexuality: Discussed sexually transmitted diseases, partner selection, use of condoms, avoidance of unintended pregnancy  and contraceptive alternatives.   Advised to avoid cigarette smoking.  I discussed with the patient that most people either abstain from alcohol or drink within safe limits (<=14/week and <=4 drinks/occasion for males, <=7/weeks and <= 3 drinks/occasion for females) and that the risk for alcohol disorders and other health effects rises proportionally with the number of drinks per week and how often a drinker exceeds daily limits.  Discussed cessation/primary prevention of drug use and availability of treatment for abuse.   Diet: Encouraged to adjust caloric intake to maintain  or achieve ideal body weight, to reduce intake of dietary saturated fat and total fat, to limit sodium intake by avoiding high sodium foods and not adding table salt, and to maintain adequate dietary potassium and calcium preferably from fresh fruits, vegetables, and low-fat dairy products.    stressed the importance of regular exercise  Injury prevention: Discussed safety belts, safety helmets, smoke detector, smoking near bedding or upholstery.   Dental health: Discussed importance of regular tooth brushing, flossing, and dental visits.   Follow up plan: NEXT PREVENTATIVE  PHYSICAL DUE IN 1 YEAR. Return in about 6 months (around 04/10/2022).

## 2021-10-11 NOTE — Assessment & Plan Note (Signed)
Doing well with A1c of 6.0. Continue current regimen. Continue to monitor. Call with any concerns.

## 2021-10-15 LAB — CBC WITH DIFFERENTIAL/PLATELET
Basophils Absolute: 0 10*3/uL (ref 0.0–0.2)
Basos: 1 %
EOS (ABSOLUTE): 0.1 10*3/uL (ref 0.0–0.4)
Eos: 2 %
Hematocrit: 45.8 % (ref 37.5–51.0)
Hemoglobin: 14.8 g/dL (ref 13.0–17.7)
Immature Grans (Abs): 0 10*3/uL (ref 0.0–0.1)
Immature Granulocytes: 0 %
Lymphocytes Absolute: 2.1 10*3/uL (ref 0.7–3.1)
Lymphs: 44 %
MCH: 28.1 pg (ref 26.6–33.0)
MCHC: 32.3 g/dL (ref 31.5–35.7)
MCV: 87 fL (ref 79–97)
Monocytes Absolute: 0.5 10*3/uL (ref 0.1–0.9)
Monocytes: 10 %
Neutrophils Absolute: 2 10*3/uL (ref 1.4–7.0)
Neutrophils: 43 %
Platelets: 210 10*3/uL (ref 150–450)
RBC: 5.27 x10E6/uL (ref 4.14–5.80)
RDW: 12.8 % (ref 11.6–15.4)
WBC: 4.7 10*3/uL (ref 3.4–10.8)

## 2021-10-15 LAB — LIPID PANEL W/O CHOL/HDL RATIO
Cholesterol, Total: 172 mg/dL (ref 100–199)
HDL: 41 mg/dL (ref >39)
LDL Chol Calc (NIH): 112 mg/dL — ABNORMAL HIGH (ref 0–99)
Triglycerides: 105 mg/dL (ref 0–149)
VLDL Cholesterol Cal: 19 mg/dL (ref 5–40)

## 2021-10-15 LAB — COMPREHENSIVE METABOLIC PANEL
ALT: 19 IU/L (ref 0–44)
AST: 24 IU/L (ref 0–40)
Albumin/Globulin Ratio: 1.8 (ref 1.2–2.2)
Albumin: 4.8 g/dL (ref 3.8–4.9)
Alkaline Phosphatase: 59 IU/L (ref 44–121)
BUN/Creatinine Ratio: 13 (ref 9–20)
BUN: 14 mg/dL (ref 6–24)
Bilirubin Total: 0.3 mg/dL (ref 0.0–1.2)
CO2: 22 mmol/L (ref 20–29)
Calcium: 9.7 mg/dL (ref 8.7–10.2)
Chloride: 101 mmol/L (ref 96–106)
Creatinine, Ser: 1.04 mg/dL (ref 0.76–1.27)
Globulin, Total: 2.6 g/dL (ref 1.5–4.5)
Glucose: 166 mg/dL — ABNORMAL HIGH (ref 70–99)
Potassium: 4.4 mmol/L (ref 3.5–5.2)
Sodium: 139 mmol/L (ref 134–144)
Total Protein: 7.4 g/dL (ref 6.0–8.5)
eGFR: 86 mL/min/{1.73_m2} (ref >59)

## 2021-10-15 LAB — HEPATITIS C ANTIBODY: Hep C Virus Ab: 0.3 s/co ratio (ref 0.0–0.9)

## 2021-11-02 ENCOUNTER — Other Ambulatory Visit: Payer: Self-pay | Admitting: Family Medicine

## 2021-11-02 NOTE — Telephone Encounter (Signed)
All meds filled on same day (10/11/21) 6 month fill  Requested too early Same pharmacy that meds were filled as is the request.  Requested Prescriptions  Refused Prescriptions Disp Refills  . JARDIANCE 25 MG TABS tablet [Pharmacy Med Name: JARDIANCE 25MG TABLETS] 30 tablet     Sig: TAKE 1 TABLET(25 MG) BY MOUTH DAILY     Endocrinology:  Diabetes - SGLT2 Inhibitors Failed - 11/02/2021  2:03 PM      Failed - LDL in normal range and within 360 days    LDL Chol Calc (NIH)  Date Value Ref Range Status  10/11/2021 112 (H) 0 - 99 mg/dL Final         Failed - AA eGFR in normal range and within 360 days    GFR calc Af Amer  Date Value Ref Range Status  06/29/2020 116 >59 mL/min/1.73 Final    Comment:    **[facility] currently reports eGFR in compliance with the current**   recommendations of the [employer]. [facility] will   update reporting as new guidelines are published from the NKF-ASN   Task force.    GFR calc non Af Amer  Date Value Ref Range Status  06/29/2020 100 >59 mL/min/1.73 Final   eGFR  Date Value Ref Range Status  10/11/2021 86 >59 mL/min/1.73 Final         Passed - Cr in normal range and within 360 days    Creatinine, Ser  Date Value Ref Range Status  10/11/2021 1.04 0.76 - 1.27 mg/dL Final         Passed - HBA1C is between 0 and 7.9 and within 180 days    HB A1C (BAYER DCA - WAIVED)  Date Value Ref Range Status  10/11/2021 6.0 (H) 4.8 - 5.6 % Final    Comment:             Prediabetes: 5.7 - 6.4          Diabetes: >6.4          Glycemic control for adults with diabetes: <7.0          Passed - Valid encounter within last 6 months    Recent Outpatient Visits          3 weeks ago Routine general medical examination at a health care facility   [facility], [place] P, DO   8 months ago Type 2 diabetes mellitus with hyperglycemia, without long-term current use of insulin [facility])   [place], [place] P, DO    1 year ago Type 2 diabetes mellitus with hyperglycemia, without long-term current use of insulin ([place])   [facility] Noemi Chapel A, NP   2 years ago Type 2 diabetes mellitus with hyperglycemia, without long-term current use of insulin ([place])   [place], [place] P, DO   2 years ago Type 2 diabetes mellitus with hyperglycemia, without long-term current use of insulin ([place])   [place], [place], DO      Future Appointments            In 5 months Johnson, Barb Merino, DO [place], [facility]           . lisinopril-hydrochlorothiazide (ZESTORETIC) 20-12.5 MG tablet [Pharmacy Med Name: LISINOPRIL-HCTZ 20/12.5MG TABLETS] 30 tablet     Sig: TAKE 1 TABLET BY MOUTH DAILY     Cardiovascular:  ACEI + Diuretic Combos Passed - 11/02/2021  2:03 PM  Passed - Na in normal range and within 180 days    Sodium  Date Value Ref Range Status  10/11/2021 139 134 - 144 mmol/L Final         Passed - K in normal range and within 180 days    Potassium  Date Value Ref Range Status  10/11/2021 4.4 3.5 - 5.2 mmol/L Final         Passed - Cr in normal range and within 180 days    Creatinine, Ser  Date Value Ref Range Status  10/11/2021 1.04 0.76 - 1.27 mg/dL Final         Passed - Ca in normal range and within 180 days    Calcium  Date Value Ref Range Status  10/11/2021 9.7 8.7 - 10.2 mg/dL Final         Passed - Patient is not pregnant      Passed - Last BP in normal range    BP Readings from Last 1 Encounters:  10/11/21 131/83         Passed - Valid encounter within last 6 months    Recent Outpatient Visits          3 weeks ago Routine general medical examination at a health care facility   [facility], [place] P, DO   8 months ago Type 2 diabetes mellitus with hyperglycemia, without long-term current use of insulin ([place])   [place], Megan P, DO   1 year ago Type 2 diabetes  mellitus with hyperglycemia, without long-term current use of insulin ([place])   [place] Noemi Chapel A, NP   2 years ago Type 2 diabetes mellitus with hyperglycemia, without long-term current use of insulin ([place])   [place], Megan P, DO   2 years ago Type 2 diabetes mellitus with hyperglycemia, without long-term current use of insulin ([place])   [place], [place], DO      Future Appointments            In 5 months Johnson, Megan P, DO [facility], [facility]           . metFORMIN (GLUCOPHAGE-XR) 500 MG 24 hr tablet [Pharmacy Med Name: METFORMIN ER 500MG 24HR TABS] 120 tablet     Sig: TAKE 2 TABLETS(1000 MG) BY MOUTH TWICE DAILY     Endocrinology:  Diabetes - Biguanides Failed - 11/02/2021  2:03 PM      Failed - AA eGFR in normal range and within 360 days    GFR calc Af Amer  Date Value Ref Range Status  06/29/2020 116 >59 mL/min/1.73 Final    Comment:    **[facility] currently reports eGFR in compliance with the current**   recommendations of the [employer]. [facility] will   update reporting as new guidelines are published from the NKF-ASN   Task force.    GFR calc non Af Amer  Date Value Ref Range Status  06/29/2020 100 >59 mL/min/1.73 Final   eGFR  Date Value Ref Range Status  10/11/2021 86 >59 mL/min/1.73 Final         Passed - Cr in normal range and within 360 days    Creatinine, Ser  Date Value Ref Range Status  10/11/2021 1.04 0.76 - 1.27 mg/dL Final         Passed - HBA1C is between 0 and 7.9 and within 180 days    HB A1C (BAYER DCA - WAIVED)  Date Value Ref Range Status  10/11/2021 6.0 (H) 4.8 - 5.6 % Final    Comment:             Prediabetes: 5.7 - 6.4          Diabetes: >6.4          Glycemic control for adults with diabetes: <7.0          Passed - Valid encounter within last 6 months    Recent Outpatient Visits          3 weeks ago Routine general medical  examination at a health care facility   [facility], [place] P, DO   8 months ago Type 2 diabetes mellitus with hyperglycemia, without long-term current use of insulin [facility])   [place], Megan P, DO   1 year ago Type 2 diabetes mellitus with hyperglycemia, without long-term current use of insulin ([place])   [facility] Noemi Chapel A, NP   2 years ago Type 2 diabetes mellitus with hyperglycemia, without long-term current use of insulin ([place])   [place], Megan P, DO   2 years ago Type 2 diabetes mellitus with hyperglycemia, without long-term current use of insulin ([place])   [place], [place], DO      Future Appointments            In 5 months Johnson, Barb Merino, DO [place], [facility]           . rosuvastatin (CRESTOR) 20 MG tablet [Pharmacy Med Name: ROSUVASTATIN 20MG TABLETS] 30 tablet     Sig: TAKE 1 TABLET BY MOUTH DAILY     Cardiovascular:  Antilipid - Statins Failed - 11/02/2021  2:03 PM      Failed - LDL in normal range and within 360 days    LDL Chol Calc (NIH)  Date Value Ref Range Status  10/11/2021 112 (H) 0 - 99 mg/dL Final         Passed - Total Cholesterol in normal range and within 360 days    Cholesterol, Total  Date Value Ref Range Status  10/11/2021 172 100 - 199 mg/dL Final   Cholesterol Piccolo, Waived  Date Value Ref Range Status  06/03/2017 WILL FOLLOW  Preliminary         Passed - HDL in normal range and within 360 days    HDL  Date Value Ref Range Status  10/11/2021 41 >39 mg/dL Final         Passed - Triglycerides in normal range and within 360 days    Triglycerides  Date Value Ref Range Status  10/11/2021 105 0 - 149 mg/dL Final   Triglycerides Piccolo,Waived  Date Value Ref Range Status  06/03/2017 WILL FOLLOW  Preliminary         Passed - Patient is not pregnant      Passed - Valid encounter within last 12 months     Recent Outpatient Visits          3 weeks ago Routine general medical examination at a health care facility   [facility], [place] P, DO   8 months ago Type 2 diabetes mellitus with hyperglycemia, without long-term current use of insulin ([place])   [place], Megan P, DO   1 year ago Type 2 diabetes mellitus with hyperglycemia, without long-term current use of insulin ([place])   [facility] Eulogio Bear, NP  2 years ago Type 2 diabetes mellitus with hyperglycemia, without long-term current use of insulin ([place])   [place], Megan P, DO   2 years ago Type 2 diabetes mellitus with hyperglycemia, without long-term current use of insulin [facility])   [place], [place], DO      Future Appointments            In 5 months Wynetta Emery, Barb Merino, DO [facility], [facility]

## 2021-11-04 ENCOUNTER — Other Ambulatory Visit: Payer: Self-pay | Admitting: Family Medicine

## 2021-11-04 NOTE — Telephone Encounter (Signed)
Medication Refill - Medication:  lisinopril-hydrochlorothiazide (ZESTORETIC) 20-12.5 MG tablet  empagliflozin (JARDIANCE) 25 MG TABS tablet  Has the patient contacted their pharmacy? Yes.   Was refused the first time due to being requested to soon, but pt states out of the medication is out of these two  Preferred Pharmacy (with phone number or street name):  [id number] [place] D[place], [place] - [address] AT [facility] [address]  [address] [place], [place] [address]  Phone:  [phone]  Fax:  [phone]  Has the patient been seen for an appointment in the last year OR does the patient have an upcoming appointment? Yes.    Agent: Please be advised that RX refills may take up to 3 business days. We ask that you follow-up with your pharmacy.

## 2021-11-04 NOTE — Telephone Encounter (Signed)
Called pharmacy. There are refills available for both of these medications. Called pt and LMOM. When pt returns call please let him know that rx's are available for him.

## 2021-11-05 NOTE — Telephone Encounter (Signed)
Both meds were refilled on the same day 10/11/21 #90 1 RF Requested Prescriptions  Refused Prescriptions Disp Refills  . empagliflozin (JARDIANCE) 25 MG TABS tablet 90 tablet 1    Sig: TAKE 1 TABLET(25 MG) BY MOUTH DAILY     Endocrinology:  Diabetes - SGLT2 Inhibitors Failed - 11/04/2021 12:19 PM      Failed - LDL in normal range and within 360 days    LDL Chol Calc (NIH)  Date Value Ref Range Status  10/11/2021 112 (H) 0 - 99 mg/dL Final         Failed - AA eGFR in normal range and within 360 days    GFR calc Af Amer  Date Value Ref Range Status  06/29/2020 116 >59 mL/min/1.73 Final    Comment:    **[facility] currently reports eGFR in compliance with the current**   recommendations of the [employer]. [facility] will   update reporting as new guidelines are published from the NKF-ASN   Task force.    GFR calc non Af Amer  Date Value Ref Range Status  06/29/2020 100 >59 mL/min/1.73 Final   eGFR  Date Value Ref Range Status  10/11/2021 86 >59 mL/min/1.73 Final         Passed - Cr in normal range and within 360 days    Creatinine, Ser  Date Value Ref Range Status  10/11/2021 1.04 0.76 - 1.27 mg/dL Final         Passed - HBA1C is between 0 and 7.9 and within 180 days    HB A1C (BAYER DCA - WAIVED)  Date Value Ref Range Status  10/11/2021 6.0 (H) 4.8 - 5.6 % Final    Comment:             Prediabetes: 5.7 - 6.4          Diabetes: >6.4          Glycemic control for adults with diabetes: <7.0          Passed - Valid encounter within last 6 months    Recent Outpatient Visits          3 weeks ago Routine general medical examination at a health care facility   [facility], [place] P, DO   8 months ago Type 2 diabetes mellitus with hyperglycemia, without long-term current use of insulin [facility])   [place], [place] P, DO   1 year ago Type 2 diabetes mellitus with hyperglycemia, without long-term current use of insulin  ([place])   [facility] Noemi Chapel A, NP   2 years ago Type 2 diabetes mellitus with hyperglycemia, without long-term current use of insulin ([place])   [place], [place] P, DO   2 years ago Type 2 diabetes mellitus with hyperglycemia, without long-term current use of insulin ([place])   [place], [place], DO      Future Appointments            In 5 months Johnson, Barb Merino, DO [place], [facility]           . lisinopril-hydrochlorothiazide (ZESTORETIC) 20-12.5 MG tablet 90 tablet 1    Sig: Take 1 tablet by mouth daily.     Cardiovascular:  ACEI + Diuretic Combos Passed - 11/04/2021 12:19 PM      Passed - Na in normal range and within 180 days    Sodium  Date Value Ref Range Status  10/11/2021 139 134 -  144 mmol/L Final         Passed - K in normal range and within 180 days    Potassium  Date Value Ref Range Status  10/11/2021 4.4 3.5 - 5.2 mmol/L Final         Passed - Cr in normal range and within 180 days    Creatinine, Ser  Date Value Ref Range Status  10/11/2021 1.04 0.76 - 1.27 mg/dL Final         Passed - Ca in normal range and within 180 days    Calcium  Date Value Ref Range Status  10/11/2021 9.7 8.7 - 10.2 mg/dL Final         Passed - Patient is not pregnant      Passed - Last BP in normal range    BP Readings from Last 1 Encounters:  10/11/21 131/83         Passed - Valid encounter within last 6 months    Recent Outpatient Visits          3 weeks ago Routine general medical examination at a health care facility   [facility], [place] P, DO   8 months ago Type 2 diabetes mellitus with hyperglycemia, without long-term current use of insulin ([place])   [place], Megan P, DO   1 year ago Type 2 diabetes mellitus with hyperglycemia, without long-term current use of insulin ([place])   [facility] Eulogio Bear, NP   2 years ago Type 2  diabetes mellitus with hyperglycemia, without long-term current use of insulin ([place])   [place], Megan P, DO   2 years ago Type 2 diabetes mellitus with hyperglycemia, without long-term current use of insulin ([place])   [place], [place], DO      Future Appointments            In 5 months Johnson, Barb Merino, DO [facility], [facility]

## 2021-11-06 ENCOUNTER — Emergency Department
Admission: EM | Admit: 2021-11-06 | Discharge: 2021-11-06 | Disposition: A | Discharge status: Home / Self Care | Payer: BC Managed Care – PPO | Attending: Emergency Medicine | Admitting: Emergency Medicine

## 2021-11-06 ENCOUNTER — Other Ambulatory Visit: Payer: Self-pay

## 2021-11-06 DIAGNOSIS — E119 Type 2 diabetes mellitus without complications: Secondary | ICD-10-CM | POA: Diagnosis not present

## 2021-11-06 DIAGNOSIS — Z7984 Long term (current) use of oral hypoglycemic drugs: Secondary | ICD-10-CM | POA: Insufficient documentation

## 2021-11-06 DIAGNOSIS — K0889 Other specified disorders of teeth and supporting structures: Secondary | ICD-10-CM | POA: Diagnosis not present

## 2021-11-06 DIAGNOSIS — Z79899 Other long term (current) drug therapy: Secondary | ICD-10-CM | POA: Insufficient documentation

## 2021-11-06 DIAGNOSIS — K029 Dental caries, unspecified: Secondary | ICD-10-CM | POA: Diagnosis not present

## 2021-11-06 DIAGNOSIS — I10 Essential (primary) hypertension: Secondary | ICD-10-CM | POA: Diagnosis not present

## 2021-11-06 MED ORDER — HYDROCODONE-ACETAMINOPHEN 5-325 MG PO TABS: 1.0000 | ORAL_TABLET | Freq: Four times a day (QID) | ORAL | 0 refills | Status: DC | PRN

## 2021-11-06 MED ORDER — AMOXICILLIN 875 MG PO TABS: 875.0000 mg | ORAL_TABLET | Freq: Two times a day (BID) | ORAL | 0 refills | Status: DC

## 2021-11-06 NOTE — ED Triage Notes (Signed)
Pt c/o left upper tooth pain for the past 2 days.

## 2021-11-06 NOTE — ED Provider Notes (Signed)
[facility] Emergency Department Provider Note  ____________________________________________   Event Date/Time   First MD Initiated Contact with Patient 11/06/21 0801     (approximate)  I have reviewed the triage vital signs and the nursing notes.   HISTORY  Chief Complaint Dental Pain    HPI Greg Wood is a 50 y.o. male presents emergency department complaining of left upper tooth pain for 2 days.  States some swelling on the jaw.  No fever or chills.  States he is taken multiple doses of Tylenol and ibuprofen without any relief.  Tried to call his dentist but was unable to be seen today.  States he had to leave work due to the pain.  No chest pain, shortness of breath, fever, chills  Past Medical History:  Diagnosis Date   Acute bronchitis    Acute lateral meniscus tear of right knee    Acute medial meniscus tear of right knee    Allergic rhinitis due to pollen    Blood in the urine    Diabetes mellitus without complication ([place])    Hypercalcemia    Resolved on recheck   Hyperlipidemia    Hypertension    Right medial tibial plateau fracture, closed, initial encounter     Patient Active Problem List   Diagnosis Date Noted   Noncompliance 01/28/2016   Blood in the urine    Allergic rhinitis due to pollen    Diabetes mellitus with hyperglycemia ([place])    Hyperlipidemia    Benign hypertensive renal disease     Past Surgical History:  Procedure Laterality Date   CHONDROPLASTY Right 08/04/2018   Procedure: CHONDROPLASTY;  Surgeon: Elsie Saas, MD;  Location: [place];  Service: Orthopedics;  Laterality: Right;   KNEE ARTHROSCOPY WITH LATERAL MENISECTOMY Right 08/04/2018   Procedure: KNEE ARTHROSCOPY WITH LATERAL MENISECTOMY;  Surgeon: Elsie Saas, MD;  Location: [place];  Service: Orthopedics;  Laterality: Right;   KNEE ARTHROSCOPY WITH MEDIAL MENISECTOMY Right 08/04/2018   Procedure: KNEE ARTHROSCOPY  WITH MEDIAL MENISECTOMY;  Surgeon: Elsie Saas, MD;  Location: [place];  Service: Orthopedics;  Laterality: Right;   KNEE ARTHROSCOPY WITH SUBCHONDROPLASTY Right 08/04/2018   Procedure: CHONDROPLASTY AND SUBCHONDROPLASTY MEDIAL TIBIAL PLATEAU;  Surgeon: Elsie Saas, MD;  Location: [place];  Service: Orthopedics;  Laterality: Right;    Prior to Admission medications   Medication Sig Start Date End Date Taking? Authorizing Provider  amoxicillin (AMOXIL) 875 MG tablet Take 1 tablet (875 mg total) by mouth 2 (two) times daily. 11/06/21  Yes Niamya Vittitow, Linden Dolin, PA-C  HYDROcodone-acetaminophen (NORCO/VICODIN) 5-325 MG tablet Take 1 tablet by mouth every 6 (six) hours as needed for moderate pain. 11/06/21  Yes Garrit Marrow, Linden Dolin, PA-C  Blood Glucose Monitoring Suppl (CONTOUR NEXT MONITOR) w/Device KIT USE PER PACKAGE INSTRUCTIONS 12/07/20   [provider]  empagliflozin (JARDIANCE) 25 MG TABS tablet TAKE 1 TABLET(25 MG) BY MOUTH DAILY 10/11/21   Johnson, Megan P, DO  glucose blood test strip 1 each by Other route as needed for other. Use as instructed    Wynetta Emery, Megan P, DO  ibuprofen (ADVIL) 800 MG tablet Take 1 tablet (800 mg total) by mouth 3 (three) times daily. 04/26/21   Zigmund Gottron, NP  lisinopril-hydrochlorothiazide (ZESTORETIC) 20-12.5 MG tablet Take 1 tablet by mouth daily. 10/11/21   Johnson, Megan P, DO  metFORMIN (GLUCOPHAGE-XR) 500 MG 24 hr tablet Take 2 tablets (1,000 mg total) by mouth  2 (two) times daily. 10/11/21   Johnson, Megan P, DO  rosuvastatin (CRESTOR) 20 MG tablet Take 1 tablet by mouth daily. 10/11/21   Valerie Roys, DO    Allergies Patient has no known allergies.  Family History  Problem Relation Age of Onset   Diabetes Mother    Diabetes Maternal Grandmother     Social History Social History   Tobacco Use   Smoking status: Never   Smokeless tobacco: Never  Vaping Use   Vaping Use: Never used  Substance Use  Topics   Alcohol use: No   Drug use: No    Review of Systems  Constitutional: No fever/chills Eyes: No visual changes. ENT: No sore throat. Respiratory: Denies cough Cardiovascular: Denies chest pain Gastrointestinal: Denies abdominal pain Genitourinary: Negative for dysuria. Musculoskeletal: Negative for back pain. Skin: Negative for rash. Psychiatric: no mood changes,     ____________________________________________   PHYSICAL EXAM:  VITAL SIGNS: ED Triage Vitals  Enc Vitals Group     BP 11/06/21 0743 131/81     Pulse Rate 11/06/21 0743 73     Resp 11/06/21 0743 16     Temp 11/06/21 0743 98 F (36.7 C)     Temp Source 11/06/21 0743 Oral     SpO2 11/06/21 0743 97 %     Weight 11/06/21 0739 220 lb (99.8 kg)     Height 11/06/21 0739 6' (1.829 m)     Head Circumference --      Peak Flow --      Pain Score 11/06/21 0739 10     Pain Loc --      Pain Edu? --      Excl. in [place]? --     Constitutional: Alert and oriented. Well appearing and in no acute distress. Eyes: Conjunctivae are normal.  Head: Atraumatic. Nose: No congestion/rhinnorhea. Mouth/Throat: Mucous membranes are moist.  Gums swollen on the left upper molars Neck:  supple no lymphadenopathy noted Cardiovascular: Normal rate, regular rhythm. Heart sounds are normal Respiratory: Normal respiratory effort.  No retractions, lungs c t a  GU: deferred Musculoskeletal: FROM all extremities, warm and well perfused Neurologic:  Normal speech and language.  Skin:  Skin is warm, dry and intact. No rash noted. Psychiatric: Mood and affect are normal. Speech and behavior are normal.  ____________________________________________   LABS (all labs ordered are listed, but only abnormal results are displayed)  Labs Reviewed - No data to  display ____________________________________________   ____________________________________________  RADIOLOGY    ____________________________________________   PROCEDURES  Procedure(s) performed: No  Procedures    ____________________________________________   INITIAL IMPRESSION / ASSESSMENT AND PLAN / ED COURSE  Pertinent labs & imaging results that were available during my care of the patient were reviewed by me and considered in my medical decision making (see chart for details).   Patient's 50 year old male presents with dental pain.  See HPI.  Physical exam shows patient appears stable.  Exam is consistent with a dental caries/abscess.  I did explain all the findings to the patient.  He was given a prescription for amoxicillin and Vicodin.  He is to follow-up with his regular dentist.  Return emergency department worsening.  He can also take over-the-counter ibuprofen.  Discharged in stable condition.  Was also given a work note.     Greg Wood was evaluated in Emergency Department on 11/06/2021 for the symptoms described in the history of present illness. He was evaluated in the context of the [place] COVID-19  pandemic, which necessitated consideration that the patient might be at risk for infection with the SARS-CoV-2 virus that causes COVID-19. Institutional protocols and algorithms that pertain to the evaluation of patients at risk for COVID-19 are in a state of rapid change based on information released by regulatory bodies including the CDC and federal and state organizations. These policies and algorithms were followed during the patient's care in the ED.    As part of my medical decision making, I reviewed the following data within the [place] notes reviewed and incorporated, Old chart reviewed, Notes from prior ED visits, and [place] Controlled Substance Database  ____________________________________________   FINAL CLINICAL  IMPRESSION(S) / ED DIAGNOSES  Final diagnoses:  Pain due to dental caries      NEW MEDICATIONS STARTED DURING THIS VISIT:  New Prescriptions   AMOXICILLIN (AMOXIL) 875 MG TABLET    Take 1 tablet (875 mg total) by mouth 2 (two) times daily.   HYDROCODONE-ACETAMINOPHEN (NORCO/VICODIN) 5-325 MG TABLET    Take 1 tablet by mouth every 6 (six) hours as needed for moderate pain.     Note:  This document was prepared using Dragon voice recognition software and may include unintentional dictation errors.    Versie Starks, PA-C 11/06/21 5516    Harvest Dark, MD 11/06/21 [phone]

## 2021-11-06 NOTE — Discharge Instructions (Signed)
Follow-up with your regular dentist.  Please call for an appointment.  Return emergency department worsening.

## 2022-04-11 ENCOUNTER — Ambulatory Visit: Payer: BC Managed Care – PPO | Admitting: Family Medicine

## 2022-05-19 ENCOUNTER — Other Ambulatory Visit: Payer: Self-pay | Admitting: Family Medicine

## 2022-05-19 ENCOUNTER — Encounter: Payer: Self-pay | Admitting: *Deleted

## 2022-05-19 NOTE — Telephone Encounter (Signed)
Message left with number to call to schedule appt so meds can be refilled.

## 2022-05-30 ENCOUNTER — Ambulatory Visit: Payer: BC Managed Care – PPO | Admitting: Family Medicine

## 2022-06-03 ENCOUNTER — Ambulatory Visit: Payer: Self-pay

## 2022-06-03 ENCOUNTER — Ambulatory Visit (INDEPENDENT_AMBULATORY_CARE_PROVIDER_SITE_OTHER): Payer: BC Managed Care – PPO | Admitting: Unknown Physician Specialty

## 2022-06-03 ENCOUNTER — Encounter: Payer: Self-pay | Admitting: Unknown Physician Specialty

## 2022-06-03 VITALS — BP 149/90 | HR 65 | Temp 98.9°F | Ht 72.01 in | Wt 221.8 lb

## 2022-06-03 DIAGNOSIS — H5712 Ocular pain, left eye: Secondary | ICD-10-CM

## 2022-06-03 DIAGNOSIS — H0015 Chalazion left lower eyelid: Secondary | ICD-10-CM | POA: Diagnosis not present

## 2022-06-03 DIAGNOSIS — E119 Type 2 diabetes mellitus without complications: Secondary | ICD-10-CM | POA: Diagnosis not present

## 2022-06-03 LAB — HM DIABETES EYE EXAM

## 2022-06-03 NOTE — Progress Notes (Signed)
 BP (!) 149/90   Pulse 65   Temp 98.9 F (37.2 C) (Oral)   Ht 6' 0.01" (1.829 m)   Wt 221 lb 12.8 oz (100.6 kg)   SpO2 97%   BMI 30.07 kg/m    Subjective:    Patient ID: Greg Wood, male    DOB: 12/05/1970, 51 y.o.   MRN: [id number]  HPI: Greg Wood is a 51 y.o. male  Chief Complaint  Patient presents with   Eye Problem    Left eye swelling since yesterday, patient states it feels tight, blurry vision and a little painful   Pt states that he has had left lower lid eye swelling which is painful. States blurry vision.  Started yesterday but a little worse today.  No discharge.  No history of trauma  Relevant past medical, surgical, family and social history reviewed and updated as indicated. Interim medical history since our last visit reviewed. Allergies and medications reviewed and updated.  Review of Systems  Per HPI unless specifically indicated above     Objective:    BP (!) 149/90   Pulse 65   Temp 98.9 F (37.2 C) (Oral)   Ht 6' 0.01" (1.829 m)   Wt 221 lb 12.8 oz (100.6 kg)   SpO2 97%   BMI 30.07 kg/m   Wt Readings from Last 3 Encounters:  06/03/22 221 lb 12.8 oz (100.6 kg)  11/06/21 220 lb (99.8 kg)  10/11/21 218 lb 12.8 oz (99.2 kg)    Physical Exam Constitutional:      General: He is not in acute distress.    Appearance: Normal appearance. He is well-developed.  HENT:     Head: Normocephalic and atraumatic.  Eyes:     General: No scleral icterus.       Right eye: No discharge.        Left eye: No discharge.     Conjunctiva/sclera: Conjunctivae normal.     Comments: Left lower lid swollen.  Some conjunctival erythema compared to right.  Subjective report of blurred vision.  No discharge.   Cardiovascular:     Rate and Rhythm: Normal rate.  Pulmonary:     Effort: Pulmonary effort is normal.  Abdominal:     Palpations: There is no hepatomegaly or splenomegaly.  Musculoskeletal:        General: Normal range of motion.  Skin:     Coloration: Skin is not pale.     Findings: No rash.  Neurological:     Mental Status: He is alert and oriented to person, place, and time.  Psychiatric:        Behavior: Behavior normal.        Thought Content: Thought content normal.        Judgment: Judgment normal.     Results for orders placed or performed in visit on 10/11/21  Bayer DCA Hb A1c Waived  Result Value Ref Range   HB A1C (BAYER DCA - WAIVED) 6.0 (H) 4.8 - 5.6 %  Comprehensive metabolic panel  Result Value Ref Range   Glucose 166 (H) 70 - 99 mg/dL   BUN 14 6 - 24 mg/dL   Creatinine, Ser 1.04 0.76 - 1.27 mg/dL   eGFR 86 >59 mL/min/1.73   BUN/Creatinine Ratio 13 9 - 20   Sodium 139 134 - 144 mmol/L   Potassium 4.4 3.5 - 5.2 mmol/L   Chloride 101 96 - 106 mmol/L   CO2 22 20 - 29 mmol/L   Calcium   9.7 8.7 - 10.2 mg/dL   Total Protein 7.4 6.0 - 8.5 g/dL   Albumin 4.8 3.8 - 4.9 g/dL   Globulin, Total 2.6 1.5 - 4.5 g/dL   Albumin/Globulin Ratio 1.8 1.2 - 2.2   Bilirubin Total 0.3 0.0 - 1.2 mg/dL   Alkaline Phosphatase 59 44 - 121 IU/L   AST 24 0 - 40 IU/L   ALT 19 0 - 44 IU/L  CBC with Differential/Platelet  Result Value Ref Range   WBC 4.7 3.4 - 10.8 x10E3/uL   RBC 5.27 4.14 - 5.80 x10E6/uL   Hemoglobin 14.8 13.0 - 17.7 g/dL   Hematocrit 45.8 37.5 - 51.0 %   MCV 87 79 - 97 fL   MCH 28.1 26.6 - 33.0 pg   MCHC 32.3 31.5 - 35.7 g/dL   RDW 12.8 11.6 - 15.4 %   Platelets 210 150 - 450 x10E3/uL   Neutrophils 43 Not Estab. %   Lymphs 44 Not Estab. %   Monocytes 10 Not Estab. %   Eos 2 Not Estab. %   Basos 1 Not Estab. %   Neutrophils Absolute 2.0 1.4 - 7.0 x10E3/uL   Lymphocytes Absolute 2.1 0.7 - 3.1 x10E3/uL   Monocytes Absolute 0.5 0.1 - 0.9 x10E3/uL   EOS (ABSOLUTE) 0.1 0.0 - 0.4 x10E3/uL   Basophils Absolute 0.0 0.0 - 0.2 x10E3/uL   Immature Granulocytes 0 Not Estab. %   Immature Grans (Abs) 0.0 0.0 - 0.1 x10E3/uL  Lipid Panel w/o Chol/HDL Ratio  Result Value Ref Range   Cholesterol, Total 172 100 -  199 mg/dL   Triglycerides 105 0 - 149 mg/dL   HDL 41 >39 mg/dL   VLDL Cholesterol Cal 19 5 - 40 mg/dL   LDL Chol Calc (NIH) 112 (H) 0 - 99 mg/dL  Microalbumin, Urine Waived  Result Value Ref Range   Microalb, Ur Waived 10 0 - 19 mg/L   Creatinine, Urine Waived 50 10 - 300 mg/dL   Microalb/Creat Ratio <30 <30 mg/g  Hepatitis C antibody  Result Value Ref Range   Hep C Virus Ab 0.3 0.0 - 0.9 s/co ratio      Assessment & Plan:   Problem List Items Addressed This Visit   None Visit Diagnoses     Eye pain, left    -  Primary   Probable not emergent but with combination of pain, swelling, and blurry vision I would like him to be seen by a specialist.  Will see Dr. Ellin Mayhew at 2:15.          Follow up plan: With Dr. Ellin Mayhew

## 2022-06-03 NOTE — Telephone Encounter (Signed)
    Chief Complaint: Left eye swollen, tender to touch Symptoms: Above Frequency: Yesterday Pertinent Negatives: Patient denies fever Disposition: [] ED /[] Urgent Care (no appt availability in office) / [x] Appointment(In office/virtual)/ []  [facility]/ [] Home Care/ [] Refused Recommended Disposition /[]  Mobile Bus/ []  Follow-up with PCP Additional Notes:   Reason for Disposition  [1] SEVERE eyelid swelling on one side AND [2] red and painful (or tender to touch)  Answer Assessment - Initial Assessment Questions 1. ONSET: "When did the swelling start?" (e.g., minutes, hours, days)     Yesterday 2. LOCATION: "What part of the eyelids is swollen?"     Left eye 3. SEVERITY: "How swollen is it?"     Severe 4. ITCHING: "Is there any itching?" If Yes, ask: "How much?"   (Scale 1-10; mild, moderate or severe)     Mild 5. PAIN: "Is the swelling painful to touch?" If Yes, ask: "How painful is it?"   (Scale 1-10; mild, moderate or severe)     Mild 6. FEVER: "Do you have a fever?" If Yes, ask: "What is it, how was it measured, and when did it start?"      No 7. CAUSE: "What do you think is causing the swelling?"     Unsure 8. RECURRENT SYMPTOM: "Have you had eyelid swelling before?" If Yes, ask: "When was the last time?" "What happened that time?"     No 9. OTHER SYMPTOMS: "Do you have any other symptoms?" (e.g., blurred vision, eye discharge, rash, runny nose)     Blurred vision 10. PREGNANCY: "Is there any chance you are pregnant?" "When was your last menstrual period?"       N/a  Protocols used: Eye - Swelling-A-AH

## 2022-06-14 ENCOUNTER — Other Ambulatory Visit: Payer: Self-pay | Admitting: Family Medicine

## 2022-06-16 ENCOUNTER — Other Ambulatory Visit: Payer: Self-pay | Admitting: Family Medicine

## 2022-06-16 NOTE — Telephone Encounter (Signed)
Apt scheduled 06/27/22

## 2022-06-16 NOTE — Telephone Encounter (Signed)
Attempted to call patient to schedule appointment- left message to call office- courtesy 30 day Rx given Requested Prescriptions  Pending Prescriptions Disp Refills  . metFORMIN (GLUCOPHAGE-XR) 500 MG 24 hr tablet [Pharmacy Med Name: METFORMIN ER 500MG 24HR TABS] 360 tablet 1    Sig: TAKE 2 TABLETS(1000 MG) BY MOUTH TWICE DAILY     Endocrinology:  Diabetes - Biguanides Failed - 06/14/2022  8:43 AM      Failed - HBA1C is between 0 and 7.9 and within 180 days    HB A1C (BAYER DCA - WAIVED)  Date Value Ref Range Status  10/11/2021 6.0 (H) 4.8 - 5.6 % Final    Comment:             Prediabetes: 5.7 - 6.4          Diabetes: >6.4          Glycemic control for adults with diabetes: <7.0          Failed - B12 Level in normal range and within 720 days    No results found for: "VITAMINB12"       Passed - Cr in normal range and within 360 days    Creatinine, Ser  Date Value Ref Range Status  10/11/2021 1.04 0.76 - 1.27 mg/dL Final         Passed - eGFR in normal range and within 360 days    GFR calc Af Amer  Date Value Ref Range Status  06/29/2020 116 >59 mL/min/1.73 Final    Comment:    **[facility] currently reports eGFR in compliance with the current**   recommendations of the [employer]. [facility] will   update reporting as new guidelines are published from the NKF-ASN   Task force.    GFR calc non Af Amer  Date Value Ref Range Status  06/29/2020 100 >59 mL/min/1.73 Final   eGFR  Date Value Ref Range Status  10/11/2021 86 >59 mL/min/1.73 Final         Passed - Valid encounter within last 6 months    Recent Outpatient Visits          1 week ago Eye pain, left   [facility] Kathrine Haddock, NP   8 months ago Routine general medical examination at a health care facility   [facility], [place], DO   1 year ago Type 2 diabetes mellitus with hyperglycemia, without long-term current use of insulin [facility])   [place], Megan P, DO   1 year ago Type 2 diabetes mellitus with hyperglycemia, without long-term current use of insulin ([place])   [facility] Eulogio Bear, NP   2 years ago Type 2 diabetes mellitus with hyperglycemia, without long-term current use of insulin ([place])   [facility], Megan P, DO             Passed - CBC within normal limits and completed in the last 12 months    WBC  Date Value Ref Range Status  10/11/2021 4.7 3.4 - 10.8 x10E3/uL Final   RBC  Date Value Ref Range Status  10/11/2021 5.27 4.14 - 5.80 x10E6/uL Final   Hemoglobin  Date Value Ref Range Status  10/11/2021 14.8 13.0 - 17.7 g/dL Final   Hematocrit  Date Value Ref Range Status  10/11/2021 45.8 37.5 - 51.0 % Final   MCHC  Date Value Ref Range Status  10/11/2021 32.3 31.5 - 35.7 g/dL Final   [facility]  Date Value Ref Range Status  10/11/2021 28.1 26.6 - 33.0 pg Final   MCV  Date Value Ref Range Status  10/11/2021 87 79 - 97 fL Final   No results found for: "PLTCOUNTKUC", "LABPLAT", "POCPLA" RDW  Date Value Ref Range Status  10/11/2021 12.8 11.6 - 15.4 % Final

## 2022-06-18 NOTE — Telephone Encounter (Signed)
Requested medication (s) are due for refill today: no  Requested medication (s) are on the active medication list: yes  Last refill:  06/16/22  Future visit scheduled: yes  Notes to clinic:  Unable to refill per protocol, pt request 90 day supply, last refill was 06/16/22 for 30 days, 0 refills. Routing for approval.     Requested Prescriptions  Pending Prescriptions Disp Refills   metFORMIN (GLUCOPHAGE-XR) 500 MG 24 hr tablet [Pharmacy Med Name: METFORMIN ER 500MG 24HR TABS] 360 tablet     Sig: TAKE 2 TABLETS(1000 MG) BY MOUTH TWICE DAILY     Endocrinology:  Diabetes - Biguanides Failed - 06/16/2022  3:41 PM      Failed - HBA1C is between 0 and 7.9 and within 180 days    HB A1C (BAYER DCA - WAIVED)  Date Value Ref Range Status  10/11/2021 6.0 (H) 4.8 - 5.6 % Final    Comment:             Prediabetes: 5.7 - 6.4          Diabetes: >6.4          Glycemic control for adults with diabetes: <7.0          Failed - B12 Level in normal range and within 720 days    No results found for: "VITAMINB12"       Passed - Cr in normal range and within 360 days    Creatinine, Ser  Date Value Ref Range Status  10/11/2021 1.04 0.76 - 1.27 mg/dL Final         Passed - eGFR in normal range and within 360 days    GFR calc Af Amer  Date Value Ref Range Status  06/29/2020 116 >59 mL/min/1.73 Final    Comment:    **[facility] currently reports eGFR in compliance with the current**   recommendations of the [employer]. [facility] will   update reporting as new guidelines are published from the NKF-ASN   Task force.    GFR calc non Af Amer  Date Value Ref Range Status  06/29/2020 100 >59 mL/min/1.73 Final   eGFR  Date Value Ref Range Status  10/11/2021 86 >59 mL/min/1.73 Final         Passed - Valid encounter within last 6 months    Recent Outpatient Visits           2 weeks ago Eye pain, left   [facility] [place], Cheryl, NP   8 months ago Routine general  medical examination at a health care facility   [facility] Johnson, Megan P, DO   1 year ago Type 2 diabetes mellitus with hyperglycemia, without long-term current use of insulin (HCC)   [facility] Johnson, Megan P, DO   1 year ago Type 2 diabetes mellitus with hyperglycemia, without long-term current use of insulin (HCC)   [facility] Martinez, Jessica A, NP   2 years ago Type 2 diabetes mellitus with hyperglycemia, without long-term current use of insulin (HCC)   [facility] Johnson, Megan P, DO       Future Appointments             In 1 week Johnson, Megan P, DO [facility], [facility]            Passed - CBC within normal limits and completed in the last 12 months    WBC  Date Value Ref Range Status  10/11/2021 4.7 3.4 -   10.8 x10E3/uL Final   RBC  Date Value Ref Range Status  10/11/2021 5.27 4.14 - 5.80 x10E6/uL Final   Hemoglobin  Date Value Ref Range Status  10/11/2021 14.8 13.0 - 17.7 g/dL Final   Hematocrit  Date Value Ref Range Status  10/11/2021 45.8 37.5 - 51.0 % Final   MCHC  Date Value Ref Range Status  10/11/2021 32.3 31.5 - 35.7 g/dL Final   MCH  Date Value Ref Range Status  10/11/2021 28.1 26.6 - 33.0 pg Final   MCV  Date Value Ref Range Status  10/11/2021 87 79 - 97 fL Final   No results found for: "PLTCOUNTKUC", "LABPLAT", "POCPLA" RDW  Date Value Ref Range Status  10/11/2021 12.8 11.6 - 15.4 % Final           

## 2022-06-18 NOTE — Telephone Encounter (Signed)
Requested medication (s) are due for refill today: no  Requested medication (s) are on the active medication list: yes  Last refill:  05/25/22  Future visit scheduled: yes  Notes to clinic:  Unable to refill per protocol, pt request 90 day supply.    Requested Prescriptions  Pending Prescriptions Disp Refills   metFORMIN (GLUCOPHAGE-XR) 500 MG 24 hr tablet [Pharmacy Med Name: METFORMIN ER $RemoveBefo'500MG'[id number]$  24HR TABS] 360 tablet     Sig: TAKE 2 TABLETS(1000 MG) BY MOUTH TWICE DAILY     Endocrinology:  Diabetes - Biguanides Failed - 06/16/2022  3:41 PM      Failed - HBA1C is between 0 and 7.9 and within 180 days    HB A1C (BAYER DCA - WAIVED)  Date Value Ref Range Status  10/11/2021 6.0 (H) 4.8 - 5.6 % Final    Comment:             Prediabetes: 5.7 - 6.4          Diabetes: >6.4          Glycemic control for adults with diabetes: <7.0          Failed - B12 Level in normal range and within 720 days    No results found for: "VITAMINB12"       Passed - Cr in normal range and within 360 days    Creatinine, Ser  Date Value Ref Range Status  10/11/2021 1.04 0.76 - 1.27 mg/dL Final         Passed - eGFR in normal range and within 360 days    GFR calc Af Amer  Date Value Ref Range Status  06/29/2020 116 >59 mL/min/1.73 Final    Comment:    **[facility] currently reports eGFR in compliance with the current**   recommendations of the [employer]. [facility] will   update reporting as new guidelines are published from the NKF-ASN   Task force.    GFR calc non Af Amer  Date Value Ref Range Status  06/29/2020 100 >59 mL/min/1.73 Final   eGFR  Date Value Ref Range Status  10/11/2021 86 >59 mL/min/1.73 Final         Passed - Valid encounter within last 6 months    Recent Outpatient Visits           2 weeks ago Eye pain, left   [facility] Kathrine Haddock, NP   8 months ago Routine general medical examination at a health care facility   [facility], [place], DO   1 year ago Type 2 diabetes mellitus with hyperglycemia, without long-term current use of insulin [facility])   [place], Megan P, DO   1 year ago Type 2 diabetes mellitus with hyperglycemia, without long-term current use of insulin ([place])   [facility] Noemi Chapel A, NP   2 years ago Type 2 diabetes mellitus with hyperglycemia, without long-term current use of insulin ([place])   [place], [place], DO       Future Appointments             In 1 week Johnson, Megan P, DO [place], [facility]            Passed - CBC within normal limits and completed in the last 12 months    WBC  Date Value Ref Range Status  10/11/2021 4.7 3.4 - 10.8 x10E3/uL Final   RBC  Date Value Ref Range Status  10/11/2021 5.27 4.14 - 5.80 x10E6/uL Final   Hemoglobin  Date Value Ref Range Status  10/11/2021 14.8 13.0 - 17.7 g/dL Final   Hematocrit  Date Value Ref Range Status  10/11/2021 45.8 37.5 - 51.0 % Final   MCHC  Date Value Ref Range Status  10/11/2021 32.3 31.5 - 35.7 g/dL Final   [facility]  Date Value Ref Range Status  10/11/2021 28.1 26.6 - 33.0 pg Final   MCV  Date Value Ref Range Status  10/11/2021 87 79 - 97 fL Final   No results found for: "PLTCOUNTKUC", "LABPLAT", "POCPLA" RDW  Date Value Ref Range Status  10/11/2021 12.8 11.6 - 15.4 % Final

## 2022-06-27 ENCOUNTER — Ambulatory Visit: Payer: BC Managed Care – PPO | Admitting: Family Medicine

## 2022-07-02 ENCOUNTER — Ambulatory Visit: Payer: BC Managed Care – PPO | Admitting: Family Medicine

## 2022-07-11 ENCOUNTER — Ambulatory Visit: Payer: BC Managed Care – PPO | Admitting: Family Medicine

## 2022-07-19 ENCOUNTER — Other Ambulatory Visit: Payer: Self-pay | Admitting: Family Medicine

## 2022-07-21 ENCOUNTER — Other Ambulatory Visit: Payer: Self-pay | Admitting: Family Medicine

## 2022-07-21 NOTE — Telephone Encounter (Signed)
Requested Prescriptions  Pending Prescriptions Disp Refills  . metFORMIN (GLUCOPHAGE-XR) 500 MG 24 hr tablet [Pharmacy Med Name: METFORMIN ER $RemoveBefo'500MG'[id number]$  24HR TABS] 180 tablet 0    Sig: TAKE 2 TABLETS(1000 MG) BY MOUTH TWICE DAILY     Endocrinology:  Diabetes - Biguanides Failed - 07/19/2022  8:46 AM      Failed - HBA1C is between 0 and 7.9 and within 180 days    HB A1C (BAYER DCA - WAIVED)  Date Value Ref Range Status  10/11/2021 6.0 (H) 4.8 - 5.6 % Final    Comment:             Prediabetes: 5.7 - 6.4          Diabetes: >6.4          Glycemic control for adults with diabetes: <7.0          Failed - B12 Level in normal range and within 720 days    No results found for: "VITAMINB12"       Passed - Cr in normal range and within 360 days    Creatinine, Ser  Date Value Ref Range Status  10/11/2021 1.04 0.76 - 1.27 mg/dL Final         Passed - eGFR in normal range and within 360 days    GFR calc Af Amer  Date Value Ref Range Status  06/29/2020 116 >59 mL/min/1.73 Final    Comment:    **[facility] currently reports eGFR in compliance with the current**   recommendations of the [employer]. [facility] will   update reporting as new guidelines are published from the NKF-ASN   Task force.    GFR calc non Af Amer  Date Value Ref Range Status  06/29/2020 100 >59 mL/min/1.73 Final   eGFR  Date Value Ref Range Status  10/11/2021 86 >59 mL/min/1.73 Final         Passed - Valid encounter within last 6 months    Recent Outpatient Visits          1 month ago Eye pain, left   [facility] Kathrine Haddock, NP   9 months ago Routine general medical examination at a health care facility   [facility], [place], DO   1 year ago Type 2 diabetes mellitus with hyperglycemia, without long-term current use of insulin [facility])   [place], Megan P, DO   2 years ago Type 2 diabetes mellitus with hyperglycemia, without long-term current  use of insulin ([place])   [facility] Noemi Chapel A, NP   2 years ago Type 2 diabetes mellitus with hyperglycemia, without long-term current use of insulin ([place])   [place], [place], DO      Future Appointments            In 4 days Johnson, Megan P, DO [place], [facility]           Passed - CBC within normal limits and completed in the last 12 months    WBC  Date Value Ref Range Status  10/11/2021 4.7 3.4 - 10.8 x10E3/uL Final   RBC  Date Value Ref Range Status  10/11/2021 5.27 4.14 - 5.80 x10E6/uL Final   Hemoglobin  Date Value Ref Range Status  10/11/2021 14.8 13.0 - 17.7 g/dL Final   Hematocrit  Date Value Ref Range Status  10/11/2021 45.8 37.5 - 51.0 % Final   MCHC  Date Value Ref Range Status  10/11/2021 32.3 31.5 - 35.7 g/dL Final   [facility]  Date Value Ref Range Status  10/11/2021 28.1 26.6 - 33.0 pg Final   MCV  Date Value Ref Range Status  10/11/2021 87 79 - 97 fL Final   No results found for: "PLTCOUNTKUC", "LABPLAT", "POCPLA" RDW  Date Value Ref Range Status  10/11/2021 12.8 11.6 - 15.4 % Final

## 2022-07-22 NOTE — Telephone Encounter (Signed)
Duplicate request. Requested Prescriptions  Pending Prescriptions Disp Refills  . metFORMIN (GLUCOPHAGE-XR) 500 MG 24 hr tablet [Pharmacy Med Name: METFORMIN ER 500MG 24HR TABS] 360 tablet     Sig: TAKE 2 TABLETS(1000 MG) BY MOUTH TWICE DAILY     Endocrinology:  Diabetes - Biguanides Failed - 07/21/2022 12:31 PM      Failed - HBA1C is between 0 and 7.9 and within 180 days    HB A1C (BAYER DCA - WAIVED)  Date Value Ref Range Status  10/11/2021 6.0 (H) 4.8 - 5.6 % Final    Comment:             Prediabetes: 5.7 - 6.4          Diabetes: >6.4          Glycemic control for adults with diabetes: <7.0          Failed - B12 Level in normal range and within 720 days    No results found for: "VITAMINB12"       Passed - Cr in normal range and within 360 days    Creatinine, Ser  Date Value Ref Range Status  10/11/2021 1.04 0.76 - 1.27 mg/dL Final         Passed - eGFR in normal range and within 360 days    GFR calc Af Amer  Date Value Ref Range Status  06/29/2020 116 >59 mL/min/1.73 Final    Comment:    **[facility] currently reports eGFR in compliance with the current**   recommendations of the [employer]. [facility] will   update reporting as new guidelines are published from the NKF-ASN   Task force.    GFR calc non Af Amer  Date Value Ref Range Status  06/29/2020 100 >59 mL/min/1.73 Final   eGFR  Date Value Ref Range Status  10/11/2021 86 >59 mL/min/1.73 Final         Passed - Valid encounter within last 6 months    Recent Outpatient Visits          1 month ago Eye pain, left   [facility] Kathrine Haddock, NP   9 months ago Routine general medical examination at a health care facility   [facility], [place], DO   1 year ago Type 2 diabetes mellitus with hyperglycemia, without long-term current use of insulin [facility])   [place], Megan P, DO   2 years ago Type 2 diabetes mellitus with hyperglycemia, without  long-term current use of insulin ([place])   [facility] Noemi Chapel A, NP   2 years ago Type 2 diabetes mellitus with hyperglycemia, without long-term current use of insulin ([place])   [place], [place], DO      Future Appointments            In 3 days Johnson, Megan P, DO [place], [facility]           Passed - CBC within normal limits and completed in the last 12 months    WBC  Date Value Ref Range Status  10/11/2021 4.7 3.4 - 10.8 x10E3/uL Final   RBC  Date Value Ref Range Status  10/11/2021 5.27 4.14 - 5.80 x10E6/uL Final   Hemoglobin  Date Value Ref Range Status  10/11/2021 14.8 13.0 - 17.7 g/dL Final   Hematocrit  Date Value Ref Range Status  10/11/2021 45.8 37.5 - 51.0 % Final   MCHC  Date Value Ref Range Status  10/11/2021 32.3 31.5 - 35.7 g/dL Final   [facility]  Date Value Ref Range Status  10/11/2021 28.1 26.6 - 33.0 pg Final   MCV  Date Value Ref Range Status  10/11/2021 87 79 - 97 fL Final   No results found for: "PLTCOUNTKUC", "LABPLAT", "POCPLA" RDW  Date Value Ref Range Status  10/11/2021 12.8 11.6 - 15.4 % Final

## 2022-07-25 ENCOUNTER — Ambulatory Visit: Payer: BC Managed Care – PPO | Admitting: Family Medicine

## 2022-07-26 ENCOUNTER — Other Ambulatory Visit: Payer: Self-pay | Admitting: Family Medicine

## 2022-07-29 NOTE — Telephone Encounter (Signed)
Requested Prescriptions  Pending Prescriptions Disp Refills  . lisinopril-hydrochlorothiazide (ZESTORETIC) 20-12.5 MG tablet [Pharmacy Med Name: LISINOPRIL-HCTZ 20/12.5MG TABLETS] 90 tablet 0    Sig: TAKE 1 TABLET BY MOUTH DAILY     Cardiovascular:  ACEI + Diuretic Combos Failed - 07/26/2022  8:09 AM      Failed - Na in normal range and within 180 days    Sodium  Date Value Ref Range Status  10/11/2021 139 134 - 144 mmol/L Final         Failed - K in normal range and within 180 days    Potassium  Date Value Ref Range Status  10/11/2021 4.4 3.5 - 5.2 mmol/L Final         Failed - Cr in normal range and within 180 days    Creatinine, Ser  Date Value Ref Range Status  10/11/2021 1.04 0.76 - 1.27 mg/dL Final         Failed - eGFR is 30 or above and within 180 days    GFR calc Af Amer  Date Value Ref Range Status  06/29/2020 116 >59 mL/min/1.73 Final    Comment:    **[facility] currently reports eGFR in compliance with the current**   recommendations of the [employer]. [facility] will   update reporting as new guidelines are published from the NKF-ASN   Task force.    GFR calc non Af Amer  Date Value Ref Range Status  06/29/2020 100 >59 mL/min/1.73 Final   eGFR  Date Value Ref Range Status  10/11/2021 86 >59 mL/min/1.73 Final         Failed - Last BP in normal range    BP Readings from Last 1 Encounters:  06/03/22 (!) 149/90         Passed - Patient is not pregnant      Passed - Valid encounter within last 6 months    Recent Outpatient Visits          1 month ago Eye pain, left   [facility] Kathrine Haddock, NP   9 months ago Routine general medical examination at a health care facility   [facility], [place], DO   1 year ago Type 2 diabetes mellitus with hyperglycemia, without long-term current use of insulin [facility])   [place], Megan P, DO   2 years ago Type 2 diabetes mellitus with hyperglycemia,  without long-term current use of insulin ([place])   [facility] Noemi Chapel A, NP   2 years ago Type 2 diabetes mellitus with hyperglycemia, without long-term current use of insulin ([place])   [place], [place], DO

## 2022-08-21 ENCOUNTER — Other Ambulatory Visit: Payer: Self-pay | Admitting: Family Medicine

## 2022-08-21 NOTE — Telephone Encounter (Signed)
Patient is overdue for follow up, please call to schedule. Then route to provider for refill.

## 2022-08-21 NOTE — Telephone Encounter (Signed)
Requested medication (s) are due for refill today: Yes  Requested medication (s) are on the active medication list: Yes  Last refill:  07/21/22  Future visit scheduled: No  Notes to clinic:  Asking for 90 day supply.    Requested Prescriptions  Pending Prescriptions Disp Refills   metFORMIN (GLUCOPHAGE-XR) 500 MG 24 hr tablet [Pharmacy Med Name: METFORMIN ER 500MG 24HR TABS] 360 tablet     Sig: TAKE 2 TABLETS(1000 MG) BY MOUTH TWICE DAILY     Endocrinology:  Diabetes - Biguanides Failed - 08/21/2022  3:21 AM      Failed - HBA1C is between 0 and 7.9 and within 180 days    HB A1C (BAYER DCA - WAIVED)  Date Value Ref Range Status  10/11/2021 6.0 (H) 4.8 - 5.6 % Final    Comment:             Prediabetes: 5.7 - 6.4          Diabetes: >6.4          Glycemic control for adults with diabetes: <7.0          Failed - B12 Level in normal range and within 720 days    No results found for: "VITAMINB12"       Passed - Cr in normal range and within 360 days    Creatinine, Ser  Date Value Ref Range Status  10/11/2021 1.04 0.76 - 1.27 mg/dL Final         Passed - eGFR in normal range and within 360 days    GFR calc Af Amer  Date Value Ref Range Status  06/29/2020 116 >59 mL/min/1.73 Final    Comment:    **[facility] currently reports eGFR in compliance with the current**   recommendations of the [employer]. [facility] will   update reporting as new guidelines are published from the NKF-ASN   Task force.    GFR calc non Af Amer  Date Value Ref Range Status  06/29/2020 100 >59 mL/min/1.73 Final   eGFR  Date Value Ref Range Status  10/11/2021 86 >59 mL/min/1.73 Final         Passed - Valid encounter within last 6 months    Recent Outpatient Visits           2 months ago Eye pain, left   [facility] Kathrine Haddock, NP   10 months ago Routine general medical examination at a health care facility   [facility], [place], DO   1 year  ago Type 2 diabetes mellitus with hyperglycemia, without long-term current use of insulin [facility])   [place], Megan P, DO   2 years ago Type 2 diabetes mellitus with hyperglycemia, without long-term current use of insulin ([place])   [facility] Eulogio Bear, NP   2 years ago Type 2 diabetes mellitus with hyperglycemia, without long-term current use of insulin ([place])   [facility], Megan P, DO              Passed - CBC within normal limits and completed in the last 12 months    WBC  Date Value Ref Range Status  10/11/2021 4.7 3.4 - 10.8 x10E3/uL Final   RBC  Date Value Ref Range Status  10/11/2021 5.27 4.14 - 5.80 x10E6/uL Final   Hemoglobin  Date Value Ref Range Status  10/11/2021 14.8 13.0 - 17.7 g/dL Final   Hematocrit  Date Value Ref Range Status  10/11/2021 45.8 37.5 - 51.0 % Final   MCHC  Date Value Ref Range Status  10/11/2021 32.3 31.5 - 35.7 g/dL Final   [facility]  Date Value Ref Range Status  10/11/2021 28.1 26.6 - 33.0 pg Final   MCV  Date Value Ref Range Status  10/11/2021 87 79 - 97 fL Final   No results found for: "PLTCOUNTKUC", "LABPLAT", "POCPLA" RDW  Date Value Ref Range Status  10/11/2021 12.8 11.6 - 15.4 % Final          JARDIANCE 25 MG TABS tablet [Pharmacy Med Name: JARDIANCE 25MG TABLETS] 90 tablet 0    Sig: TAKE 1 TABLET(25 MG) BY MOUTH DAILY     Endocrinology:  Diabetes - SGLT2 Inhibitors Failed - 08/21/2022  3:21 AM      Failed - HBA1C is between 0 and 7.9 and within 180 days    HB A1C (BAYER DCA - WAIVED)  Date Value Ref Range Status  10/11/2021 6.0 (H) 4.8 - 5.6 % Final    Comment:             Prediabetes: 5.7 - 6.4          Diabetes: >6.4          Glycemic control for adults with diabetes: <7.0          Passed - Cr in normal range and within 360 days    Creatinine, Ser  Date Value Ref Range Status  10/11/2021 1.04 0.76 - 1.27 mg/dL Final         Passed - eGFR in normal  range and within 360 days    GFR calc Af Amer  Date Value Ref Range Status  06/29/2020 116 >59 mL/min/1.73 Final    Comment:    **[facility] currently reports eGFR in compliance with the current**   recommendations of the [employer]. [facility] will   update reporting as new guidelines are published from the NKF-ASN   Task force.    GFR calc non Af Amer  Date Value Ref Range Status  06/29/2020 100 >59 mL/min/1.73 Final   eGFR  Date Value Ref Range Status  10/11/2021 86 >59 mL/min/1.73 Final         Passed - Valid encounter within last 6 months    Recent Outpatient Visits           2 months ago Eye pain, left   [facility] Kathrine Haddock, NP   10 months ago Routine general medical examination at a health care facility   [facility], [place], DO   1 year ago Type 2 diabetes mellitus with hyperglycemia, without long-term current use of insulin [facility])   [place], Megan P, DO   2 years ago Type 2 diabetes mellitus with hyperglycemia, without long-term current use of insulin ([place])   [facility] Noemi Chapel A, NP   2 years ago Type 2 diabetes mellitus with hyperglycemia, without long-term current use of insulin ([place])   [place], [place], DO

## 2022-08-27 ENCOUNTER — Other Ambulatory Visit: Payer: Self-pay | Admitting: Family Medicine

## 2022-08-27 NOTE — Telephone Encounter (Signed)
Requested medication (s) are due for refill today: yes  Requested medication (s) are on the active medication list: yes  Last refill:  07/21/22 #180/0  Future visit scheduled: yes 09/12/22  Notes to clinic:  Unable to refill per protocol due to failed labs, no updated results. Pt has scheduled appt.      Requested Prescriptions  Pending Prescriptions Disp Refills   metFORMIN (GLUCOPHAGE-XR) 500 MG 24 hr tablet [Pharmacy Med Name: METFORMIN ER 500MG 24HR TABS] 360 tablet     Sig: TAKE 2 TABLETS(1000 MG) BY MOUTH TWICE DAILY     Endocrinology:  Diabetes - Biguanides Failed - 08/27/2022  8:09 AM      Failed - HBA1C is between 0 and 7.9 and within 180 days    HB A1C (BAYER DCA - WAIVED)  Date Value Ref Range Status  10/11/2021 6.0 (H) 4.8 - 5.6 % Final    Comment:             Prediabetes: 5.7 - 6.4          Diabetes: >6.4          Glycemic control for adults with diabetes: <7.0          Failed - B12 Level in normal range and within 720 days    No results found for: "VITAMINB12"       Passed - Cr in normal range and within 360 days    Creatinine, Ser  Date Value Ref Range Status  10/11/2021 1.04 0.76 - 1.27 mg/dL Final         Passed - eGFR in normal range and within 360 days    GFR calc Af Amer  Date Value Ref Range Status  06/29/2020 116 >59 mL/min/1.73 Final    Comment:    **[facility] currently reports eGFR in compliance with the current**   recommendations of the [employer]. [facility] will   update reporting as new guidelines are published from the NKF-ASN   Task force.    GFR calc non Af Amer  Date Value Ref Range Status  06/29/2020 100 >59 mL/min/1.73 Final   eGFR  Date Value Ref Range Status  10/11/2021 86 >59 mL/min/1.73 Final         Passed - Valid encounter within last 6 months    Recent Outpatient Visits           2 months ago Eye pain, left   [facility] Kathrine Haddock, NP   10 months ago Routine general medical  examination at a health care facility   [facility], [place], DO   1 year ago Type 2 diabetes mellitus with hyperglycemia, without long-term current use of insulin [facility])   [place], Megan P, DO   2 years ago Type 2 diabetes mellitus with hyperglycemia, without long-term current use of insulin ([place])   [facility] Noemi Chapel A, NP   2 years ago Type 2 diabetes mellitus with hyperglycemia, without long-term current use of insulin ([place])   [place], [place], DO       Future Appointments             In 2 weeks [place], Megan P, DO [place], [facility]            Passed - CBC within normal limits and completed in the last 12 months    WBC  Date Value Ref Range Status  10/11/2021 4.7 3.4 - 10.8 x10E3/uL  Final   RBC  Date Value Ref Range Status  10/11/2021 5.27 4.14 - 5.80 x10E6/uL Final   Hemoglobin  Date Value Ref Range Status  10/11/2021 14.8 13.0 - 17.7 g/dL Final   Hematocrit  Date Value Ref Range Status  10/11/2021 45.8 37.5 - 51.0 % Final   MCHC  Date Value Ref Range Status  10/11/2021 32.3 31.5 - 35.7 g/dL Final   [facility]  Date Value Ref Range Status  10/11/2021 28.1 26.6 - 33.0 pg Final   MCV  Date Value Ref Range Status  10/11/2021 87 79 - 97 fL Final   No results found for: "PLTCOUNTKUC", "LABPLAT", "POCPLA" RDW  Date Value Ref Range Status  10/11/2021 12.8 11.6 - 15.4 % Final

## 2022-08-29 ENCOUNTER — Other Ambulatory Visit: Payer: Self-pay | Admitting: Family Medicine

## 2022-08-29 NOTE — Telephone Encounter (Signed)
Requested Prescriptions  Pending Prescriptions Disp Refills  . rosuvastatin (CRESTOR) 20 MG tablet [Pharmacy Med Name: ROSUVASTATIN 20MG  TABLETS] 90 tablet 0    Sig: TAKE 1 TABLET BY MOUTH DAILY     Cardiovascular:  Antilipid - Statins 2 Failed - 08/29/2022  4:46 PM      Failed - Lipid Panel in normal range within the last 12 months    Cholesterol, Total  Date Value Ref Range Status  10/11/2021 172 100 - 199 mg/dL Final   Cholesterol [place], [place]  Date Value Ref Range Status  06/03/2017 WILL FOLLOW  Preliminary   LDL Chol Calc (NIH)  Date Value Ref Range Status  10/11/2021 112 (H) 0 - 99 mg/dL Final   HDL  Date Value Ref Range Status  10/11/2021 41 >39 mg/dL Final   Triglycerides  Date Value Ref Range Status  10/11/2021 105 0 - 149 mg/dL Final   Triglycerides Piccolo,Waived  Date Value Ref Range Status  06/03/2017 WILL FOLLOW  Preliminary         Passed - Cr in normal range and within 360 days    Creatinine, Ser  Date Value Ref Range Status  10/11/2021 1.04 0.76 - 1.27 mg/dL Final         Passed - Patient is not pregnant      Passed - Valid encounter within last 12 months    Recent Outpatient Visits          2 months ago Eye pain, left   [facility] Kathrine Haddock, NP   10 months ago Routine general medical examination at a health care facility   [facility], [place], DO   1 year ago Type 2 diabetes mellitus with hyperglycemia, without long-term current use of insulin [facility])   [place], Megan P, DO   2 years ago Type 2 diabetes mellitus with hyperglycemia, without long-term current use of insulin ([place])   [facility] Noemi Chapel A, NP   2 years ago Type 2 diabetes mellitus with hyperglycemia, without long-term current use of insulin ([place])   [place], [place], DO      Future Appointments            In 2 weeks Wyn[place] Emery, Barb Merino, DO [place], [facility]            . JARDIANCE 25 MG TABS tablet [Pharmacy Med Name: JARDIANCE 25MG  TABLETS] 30 tablet 0    Sig: TAKE 1 TABLET(25 MG) BY MOUTH DAILY     Endocrinology:  Diabetes - SGLT2 Inhibitors Failed - 08/29/2022  4:46 PM      Failed - HBA1C is between 0 and 7.9 and within 180 days    HB A1C (BAYER DCA - WAIVED)  Date Value Ref Range Status  10/11/2021 6.0 (H) 4.8 - 5.6 % Final    Comment:             Prediabetes: 5.7 - 6.4          Diabetes: >6.4          Glycemic control for adults with diabetes: <7.0          Passed - Cr in normal range and within 360 days    Creatinine, Ser  Date Value Ref Range Status  10/11/2021 1.04 0.76 - 1.27 mg/dL Final         Passed - eGFR in normal range and within 360 days    GFR calc Af  Amer  Date Value Ref Range Status  06/29/2020 116 >59 mL/min/1.73 Final    Comment:    **[facility] currently reports eGFR in compliance with the current**   recommendations of the [employer]. [facility] will   update reporting as new guidelines are published from the NKF-ASN   Task force.    GFR calc non Af Amer  Date Value Ref Range Status  06/29/2020 100 >59 mL/min/1.73 Final   eGFR  Date Value Ref Range Status  10/11/2021 86 >59 mL/min/1.73 Final         Passed - Valid encounter within last 6 months    Recent Outpatient Visits          2 months ago Eye pain, left   [facility] Kathrine Haddock, NP   10 months ago Routine general medical examination at a health care facility   [facility], [place], DO   1 year ago Type 2 diabetes mellitus with hyperglycemia, without long-term current use of insulin [facility])   [place], Megan P, DO   2 years ago Type 2 diabetes mellitus with hyperglycemia, without long-term current use of insulin ([place])   [facility] Noemi Chapel A, NP   2 years ago Type 2 diabetes mellitus with hyperglycemia, without long-term current use of insulin  [facility])   [place], [place], DO      Future Appointments            In 2 weeks Wynetta Emery, Barb Merino, DO [facility], [facility]

## 2022-09-06 ENCOUNTER — Other Ambulatory Visit: Payer: Self-pay | Admitting: Family Medicine

## 2022-09-08 NOTE — Telephone Encounter (Signed)
Refilled 08/29/2022 #30 0 rf - courtesy refill. Requested Prescriptions  Pending Prescriptions Disp Refills  . JARDIANCE 25 MG TABS tablet [Pharmacy Med Name: JARDIANCE 25MG TABLETS] 30 tablet 0    Sig: TAKE 1 TABLET(25 MG) BY MOUTH DAILY     Endocrinology:  Diabetes - SGLT2 Inhibitors Failed - 09/06/2022  8:09 AM      Failed - HBA1C is between 0 and 7.9 and within 180 days    HB A1C (BAYER DCA - WAIVED)  Date Value Ref Range Status  10/11/2021 6.0 (H) 4.8 - 5.6 % Final    Comment:             Prediabetes: 5.7 - 6.4          Diabetes: >6.4          Glycemic control for adults with diabetes: <7.0          Passed - Cr in normal range and within 360 days    Creatinine, Ser  Date Value Ref Range Status  10/11/2021 1.04 0.76 - 1.27 mg/dL Final         Passed - eGFR in normal range and within 360 days    GFR calc Af Amer  Date Value Ref Range Status  06/29/2020 116 >59 mL/min/1.73 Final    Comment:    **[facility] currently reports eGFR in compliance with the current**   recommendations of the [employer]. [facility] will   update reporting as new guidelines are published from the NKF-ASN   Task force.    GFR calc non Af Amer  Date Value Ref Range Status  06/29/2020 100 >59 mL/min/1.73 Final   eGFR  Date Value Ref Range Status  10/11/2021 86 >59 mL/min/1.73 Final         Passed - Valid encounter within last 6 months    Recent Outpatient Visits          3 months ago Eye pain, left   [facility] Kathrine Haddock, NP   11 months ago Routine general medical examination at a health care facility   [facility], [place], DO   1 year ago Type 2 diabetes mellitus with hyperglycemia, without long-term current use of insulin [facility])   [place], Megan P, DO   2 years ago Type 2 diabetes mellitus with hyperglycemia, without long-term current use of insulin ([place])   [facility] Noemi Chapel A, NP   2  years ago Type 2 diabetes mellitus with hyperglycemia, without long-term current use of insulin [facility])   [place], [place], DO      Future Appointments            In 4 days Wynetta Emery, Barb Merino, DO [employer], [facility]

## 2022-09-10 DIAGNOSIS — Z23 Encounter for immunization: Secondary | ICD-10-CM | POA: Diagnosis not present

## 2022-09-12 ENCOUNTER — Ambulatory Visit: Payer: BC Managed Care – PPO | Admitting: Family Medicine

## 2022-09-12 ENCOUNTER — Encounter: Payer: Self-pay | Admitting: Family Medicine

## 2022-09-12 VITALS — BP 142/84 | HR 62 | Temp 97.9°F | Wt 224.7 lb

## 2022-09-12 DIAGNOSIS — Z125 Encounter for screening for malignant neoplasm of prostate: Secondary | ICD-10-CM | POA: Diagnosis not present

## 2022-09-12 DIAGNOSIS — Z1211 Encounter for screening for malignant neoplasm of colon: Secondary | ICD-10-CM

## 2022-09-12 DIAGNOSIS — E1165 Type 2 diabetes mellitus with hyperglycemia: Secondary | ICD-10-CM | POA: Diagnosis not present

## 2022-09-12 DIAGNOSIS — E782 Mixed hyperlipidemia: Secondary | ICD-10-CM

## 2022-09-12 DIAGNOSIS — I129 Hypertensive chronic kidney disease with stage 1 through stage 4 chronic kidney disease, or unspecified chronic kidney disease: Secondary | ICD-10-CM | POA: Diagnosis not present

## 2022-09-12 LAB — URINALYSIS, ROUTINE W REFLEX MICROSCOPIC
Bilirubin, UA: NEGATIVE
Ketones, UA: NEGATIVE
Leukocytes,UA: NEGATIVE
Nitrite, UA: NEGATIVE
Protein,UA: NEGATIVE
RBC, UA: NEGATIVE
Specific Gravity, UA: 1.02 (ref 1.005–1.030)
Urobilinogen, Ur: 0.2 mg/dL (ref 0.2–1.0)
pH, UA: 7 (ref 5.0–7.5)

## 2022-09-12 LAB — MICROALBUMIN, URINE WAIVED
Creatinine, Urine Waived: 100 mg/dL (ref 10–300)
Microalb, Ur Waived: 10 mg/L (ref 0–19)
Microalb/Creat Ratio: <30 mg/g (ref <30)

## 2022-09-12 LAB — BAYER DCA HB A1C WAIVED: HB A1C (BAYER DCA - WAIVED): 6.7 % — ABNORMAL HIGH (ref 4.8–5.6)

## 2022-09-12 MED ORDER — LISINOPRIL-HYDROCHLOROTHIAZIDE 20-25 MG PO TABS: 1.0000 | ORAL_TABLET | Freq: Every day | ORAL | 0 refills | Status: DC

## 2022-09-12 MED ORDER — METFORMIN HCL ER 500 MG PO TB24: ORAL_TABLET | ORAL | 1 refills | Status: DC

## 2022-09-12 MED ORDER — ROSUVASTATIN CALCIUM 20 MG PO TABS: 20.0000 mg | ORAL_TABLET | ORAL | 0 refills | Status: DC

## 2022-09-12 MED ORDER — EMPAGLIFLOZIN 25 MG PO TABS: ORAL_TABLET | ORAL | 1 refills | Status: DC

## 2022-09-12 NOTE — Assessment & Plan Note (Signed)
Up slightly with A1c of 6.7. Will continue current regimen and recheck 1 month. Call with any concerns.

## 2022-09-12 NOTE — Assessment & Plan Note (Signed)
Running a little high. Will increase his HCTZ to 25mg  and recheck 3 months. Call with any concerns. Continue to monitor.

## 2022-09-12 NOTE — Assessment & Plan Note (Signed)
Will change dosage to weekly. Rx sent to his pharmacy. Recheck 3 months.

## 2022-09-12 NOTE — Progress Notes (Signed)
BP (!) 142/84   Pulse 62   Temp 97.9 F (36.6 C)   Wt 224 lb 11.2 oz (101.9 kg)   SpO2 98%   BMI 30.47 kg/m    Subjective:    Patient ID: Greg Wood, male    DOB: 05-31-1971, 51 y.o.   MRN: [id number]  HPI: Greg Wood is a 51 y.o. male  Chief Complaint  Patient presents with   Diabetes    Sent request for eye exam from [facility]    Hypertension   Hyperlipidemia    Patient states he has not been taking his rosuvastatin    HYPERTENSION / HYPERLIPIDEMIA Satisfied with current treatment? yes Duration of hypertension: chronic BP monitoring frequency: not checking BP medication side effects: no Past BP meds: lisinopril-HCTZ Duration of hyperlipidemia: chronic Cholesterol medication side effects: no Cholesterol supplements: none Past cholesterol medications: atorvastatin Medication compliance: excellent compliance Aspirin: no Recent stressors: no Recurrent headaches: no Visual changes: no Palpitations: no Dyspnea: no Chest pain: no Lower extremity edema: no Dizzy/lightheaded: no  DIABETES- may have missed a few of his medicines, but usually takes it Hypoglycemic episodes:no Polydipsia/polyuria: no Visual disturbance: no Chest pain: no Paresthesias: no Glucose Monitoring: no  Accucheck frequency: Not Checking Taking Insulin?: no Blood Pressure Monitoring: not checking Retinal Examination: Up to Date Foot Exam: Up to Date Diabetic Education: Completed Pneumovax: Up to Date Influenza:  declined Aspirin: no   Relevant past medical, surgical, family and social history reviewed and updated as indicated. Interim medical history since our last visit reviewed. Allergies and medications reviewed and updated.  Review of Systems  Constitutional: Negative.   Respiratory: Negative.    Cardiovascular: Negative.   Gastrointestinal: Negative.   Genitourinary: Negative.   Musculoskeletal: Negative.   Neurological: Negative.   Psychiatric/Behavioral: Negative.       Per HPI unless specifically indicated above     Objective:    BP (!) 142/84   Pulse 62   Temp 97.9 F (36.6 C)   Wt 224 lb 11.2 oz (101.9 kg)   SpO2 98%   BMI 30.47 kg/m   Wt Readings from Last 3 Encounters:  09/12/22 224 lb 11.2 oz (101.9 kg)  06/03/22 221 lb 12.8 oz (100.6 kg)  11/06/21 220 lb (99.8 kg)    Physical Exam Vitals and nursing note reviewed.  Constitutional:      General: He is not in acute distress.    Appearance: Normal appearance. He is not ill-appearing, toxic-appearing or diaphoretic.  HENT:     Head: Normocephalic and atraumatic.     Right Ear: External ear normal.     Left Ear: External ear normal.     Nose: Nose normal.     Mouth/Throat:     Mouth: Mucous membranes are moist.     Pharynx: Oropharynx is clear.  Eyes:     General: No scleral icterus.       Right eye: No discharge.        Left eye: No discharge.     Extraocular Movements: Extraocular movements intact.     Conjunctiva/sclera: Conjunctivae normal.     Pupils: Pupils are equal, round, and reactive to light.  Cardiovascular:     Rate and Rhythm: Normal rate and regular rhythm.     Pulses: Normal pulses.     Heart sounds: Normal heart sounds. No murmur heard.    No friction rub. No gallop.  Pulmonary:     Effort: Pulmonary effort is normal. No respiratory distress.  Breath sounds: Normal breath sounds. No stridor. No wheezing, rhonchi or rales.  Chest:     Chest wall: No tenderness.  Musculoskeletal:        General: Normal range of motion.     Cervical back: Normal range of motion and neck supple.  Skin:    General: Skin is warm and dry.     Capillary Refill: Capillary refill takes less than 2 seconds.     Coloration: Skin is not jaundiced or pale.     Findings: No bruising, erythema, lesion or rash.  Neurological:     General: No focal deficit present.     Mental Status: He is alert and oriented to person, place, and time. Mental status is at baseline.  Psychiatric:         Mood and Affect: Mood normal.        Behavior: Behavior normal.        Thought Content: Thought content normal.        Judgment: Judgment normal.     Results for orders placed or performed in visit on 09/12/22  Urinalysis, Routine w reflex microscopic  Result Value Ref Range   Specific Gravity, UA 1.020 1.005 - 1.030   pH, UA 7.0 5.0 - 7.5   Color, UA Yellow Yellow   Appearance Ur Clear Clear   Leukocytes,UA Negative Negative   Protein,UA Negative Negative/Trace   Glucose, UA 3+ (A) Negative   Ketones, UA Negative Negative   RBC, UA Negative Negative   Bilirubin, UA Negative Negative   Urobilinogen, Ur 0.2 0.2 - 1.0 mg/dL   Nitrite, UA Negative Negative  Microalbumin, Urine Waived  Result Value Ref Range   Microalb, Ur Waived 10 0 - 19 mg/L   Creatinine, Urine Waived 100 10 - 300 mg/dL   Microalb/Creat Ratio <30 <30 mg/g  Bayer DCA Hb A1c Waived  Result Value Ref Range   HB A1C (BAYER DCA - WAIVED) 6.7 (H) 4.8 - 5.6 %      Assessment & Plan:   Problem List Items Addressed This Visit       Endocrine   Diabetes mellitus with hyperglycemia ([place]) - Primary    Up slightly with A1c of 6.7. Will continue current regimen and recheck 1 month. Call with any concerns.       Relevant Medications   rosuvastatin (CRESTOR) 20 MG tablet   lisinopril-hydrochlorothiazide (ZESTORETIC) 20-25 MG tablet   empagliflozin (JARDIANCE) 25 MG TABS tablet   metFORMIN (GLUCOPHAGE-XR) 500 MG 24 hr tablet   Other Relevant Orders   Comprehensive metabolic panel   CBC with Differential/Platelet   Urinalysis, Routine w reflex microscopic (Completed)   Microalbumin, Urine Waived (Completed)   Bayer DCA Hb A1c Waived (Completed)     Genitourinary   Benign hypertensive renal disease    Running a little high. Will increase his HCTZ to 25mg  and recheck 3 months. Call with any concerns. Continue to monitor.       Relevant Orders   Comprehensive metabolic panel   CBC with  Differential/Platelet   TSH     Other   Hyperlipidemia    Will change dosage to weekly. Rx sent to his pharmacy. Recheck 3 months.       Relevant Medications   rosuvastatin (CRESTOR) 20 MG tablet   lisinopril-hydrochlorothiazide (ZESTORETIC) 20-25 MG tablet   Other Relevant Orders   Comprehensive metabolic panel   CBC with Differential/Platelet   Lipid Panel w/o Chol/HDL Ratio   Other Visit Diagnoses  Screening for prostate cancer       Labs drawn today. Await results.    Relevant Orders   PSA   Screening for colon cancer       Cologuard ordered today.    Relevant Orders   Cologuard        Follow up plan: Return in about 3 months (around 12/13/2022) for physical.

## 2022-09-13 LAB — COMPREHENSIVE METABOLIC PANEL
ALT: 22 IU/L (ref 0–44)
AST: 19 IU/L (ref 0–40)
Albumin/Globulin Ratio: 1.8 (ref 1.2–2.2)
Albumin: 4.8 g/dL (ref 3.8–4.9)
Alkaline Phosphatase: 57 IU/L (ref 44–121)
BUN/Creatinine Ratio: 14 (ref 9–20)
BUN: 13 mg/dL (ref 6–24)
Bilirubin Total: 0.4 mg/dL (ref 0.0–1.2)
CO2: 23 mmol/L (ref 20–29)
Calcium: 9.8 mg/dL (ref 8.7–10.2)
Chloride: 101 mmol/L (ref 96–106)
Creatinine, Ser: 0.96 mg/dL (ref 0.76–1.27)
Globulin, Total: 2.6 g/dL (ref 1.5–4.5)
Glucose: 98 mg/dL (ref 70–99)
Potassium: 4.2 mmol/L (ref 3.5–5.2)
Sodium: 139 mmol/L (ref 134–144)
Total Protein: 7.4 g/dL (ref 6.0–8.5)
eGFR: 95 mL/min/{1.73_m2} (ref >59)

## 2022-09-13 LAB — CBC WITH DIFFERENTIAL/PLATELET
Basophils Absolute: 0.1 10*3/uL (ref 0.0–0.2)
Basos: 1 %
EOS (ABSOLUTE): 0.1 10*3/uL (ref 0.0–0.4)
Eos: 3 %
Hematocrit: 45.4 % (ref 37.5–51.0)
Hemoglobin: 15.2 g/dL (ref 13.0–17.7)
Immature Grans (Abs): 0 10*3/uL (ref 0.0–0.1)
Immature Granulocytes: 0 %
Lymphocytes Absolute: 1.9 10*3/uL (ref 0.7–3.1)
Lymphs: 43 %
MCH: 28.7 pg (ref 26.6–33.0)
MCHC: 33.5 g/dL (ref 31.5–35.7)
MCV: 86 fL (ref 79–97)
Monocytes Absolute: 0.6 10*3/uL (ref 0.1–0.9)
Monocytes: 14 %
Neutrophils Absolute: 1.7 10*3/uL (ref 1.4–7.0)
Neutrophils: 39 %
Platelets: 204 10*3/uL (ref 150–450)
RBC: 5.29 x10E6/uL (ref 4.14–5.80)
RDW: 13 % (ref 11.6–15.4)
WBC: 4.4 10*3/uL (ref 3.4–10.8)

## 2022-09-13 LAB — LIPID PANEL W/O CHOL/HDL RATIO
Cholesterol, Total: 162 mg/dL (ref 100–199)
HDL: 42 mg/dL (ref >39)
LDL Chol Calc (NIH): 99 mg/dL (ref 0–99)
Triglycerides: 115 mg/dL (ref 0–149)
VLDL Cholesterol Cal: 21 mg/dL (ref 5–40)

## 2022-09-13 LAB — TSH: TSH: 0.671 u[IU]/mL (ref 0.450–4.500)

## 2022-09-13 LAB — PSA: Prostate Specific Ag, Serum: 0.4 ng/mL (ref 0.0–4.0)

## 2022-11-30 ENCOUNTER — Other Ambulatory Visit: Payer: Self-pay | Admitting: Family Medicine

## 2022-12-01 NOTE — Telephone Encounter (Signed)
Unable to refill per protocol, Rx request is too soon. Last refill 09/12/22 for 90 days and 1 refill.  Requested Prescriptions  Pending Prescriptions Disp Refills   metFORMIN (GLUCOPHAGE-XR) 500 MG 24 hr tablet [Pharmacy Med Name: METFORMIN ER 500MG  24HR TABS] 180 tablet 1    Sig: TAKE 2 TABLETS(1000 MG) BY MOUTH TWICE DAILY     Endocrinology:  Diabetes - Biguanides Failed - 11/30/2022  8:09 AM      Failed - B12 Level in normal range and within 720 days    No results found for: "VITAMINB12"       Passed - Cr in normal range and within 360 days    Creatinine, Ser  Date Value Ref Range Status  09/12/2022 0.96 0.76 - 1.27 mg/dL Final         Passed - HBA1C is between 0 and 7.9 and within 180 days    HB A1C (BAYER DCA - WAIVED)  Date Value Ref Range Status  09/12/2022 6.7 (H) 4.8 - 5.6 % Final    Comment:             Prediabetes: 5.7 - 6.4          Diabetes: >6.4          Glycemic control for adults with diabetes: <7.0          Passed - eGFR in normal range and within 360 days    GFR calc Af Amer  Date Value Ref Range Status  06/29/2020 116 >59 mL/min/1.73 Final    Comment:    **[facility] currently reports eGFR in compliance with the current**   recommendations of the 08/29/2020. [facility] will   update reporting as new guidelines are published from the NKF-ASN   Task force.    GFR calc non Af Amer  Date Value Ref Range Status  06/29/2020 100 >59 mL/min/1.73 Final   eGFR  Date Value Ref Range Status  09/12/2022 95 >59 mL/min/1.73 Final         Passed - Valid encounter within last 6 months    Recent Outpatient Visits           2 months ago Type 2 diabetes mellitus with hyperglycemia, without long-term current use of insulin (HCC)   [facility] [place], Megan P, DO   6 months ago Eye pain, left   [facility], NP   1 year ago Routine general medical examination at a health care facility   [facility] P, DO   1 year ago Type 2 diabetes mellitus with hyperglycemia, without long-term current use of insulin [facility])   [facility] [place], Megan P, DO   2 years ago Type 2 diabetes mellitus with hyperglycemia, without long-term current use of insulin (HCC)   [facility], NP       Future Appointments             In 2 weeks Valentino Nose, Megan P, DO [facility], [facility]            Passed - CBC within normal limits and completed in the last 12 months    WBC  Date Value Ref Range Status  09/12/2022 4.4 3.4 - 10.8 x10E3/uL Final   RBC  Date Value Ref Range Status  09/12/2022 5.29 4.14 - 5.80 x10E6/uL Final   Hemoglobin  Date Value Ref Range Status  09/12/2022 15.2 13.0 - 17.7 g/dL Final   Hematocrit  Date Value Ref Range Status  09/12/2022 45.4 37.5 - 51.0 % Final   MCHC  Date Value Ref Range Status  09/12/2022 33.5 31.5 - 35.7 g/dL Final   [facility]  Date Value Ref Range Status  09/12/2022 28.7 26.6 - 33.0 pg Final   MCV  Date Value Ref Range Status  09/12/2022 86 79 - 97 fL Final   No results found for: "PLTCOUNTKUC", "LABPLAT", "POCPLA" RDW  Date Value Ref Range Status  09/12/2022 13.0 11.6 - 15.4 % Final         Signed Prescriptions Disp Refills   lisinopril-hydrochlorothiazide (ZESTORETIC) 20-25 MG tablet 90 tablet 0    Sig: TAKE 1 TABLET BY MOUTH DAILY     Cardiovascular:  ACEI + Diuretic Combos Failed - 11/30/2022  8:09 AM      Failed - Last BP in normal range    BP Readings from Last 1 Encounters:  09/12/22 (!) 142/84         Passed - Na in normal range and within 180 days    Sodium  Date Value Ref Range Status  09/12/2022 139 134 - 144 mmol/L Final         Passed - K in normal range and within 180 days    Potassium  Date Value Ref Range Status  09/12/2022 4.2 3.5 - 5.2 mmol/L Final         Passed - Cr in normal range and within 180 days    Creatinine, Ser  Date Value Ref Range Status   09/12/2022 0.96 0.76 - 1.27 mg/dL Final         Passed - eGFR is 30 or above and within 180 days    GFR calc Af Amer  Date Value Ref Range Status  06/29/2020 116 >59 mL/min/1.73 Final    Comment:    **[facility] currently reports eGFR in compliance with the current**   recommendations of the [employer]. [facility] will   update reporting as new guidelines are published from the NKF-ASN   Task force.    GFR calc non Af Amer  Date Value Ref Range Status  06/29/2020 100 >59 mL/min/1.73 Final   eGFR  Date Value Ref Range Status  09/12/2022 95 >59 mL/min/1.73 Final         Passed - Patient is not pregnant      Passed - Valid encounter within last 6 months    Recent Outpatient Visits           2 months ago Type 2 diabetes mellitus with hyperglycemia, without long-term current use of insulin ([place])   [place], Megan P, DO   6 months ago Eye pain, left   [facility] Kathrine Haddock, NP   1 year ago Routine general medical examination at a health care facility   [facility], [place], DO   1 year ago Type 2 diabetes mellitus with hyperglycemia, without long-term current use of insulin [facility])   [place], Megan P, DO   2 years ago Type 2 diabetes mellitus with hyperglycemia, without long-term current use of insulin ([place])   [place], Jessica A, NP       Future Appointments             In 2 weeks Wynetta Emery, Barb Merino, DO [facility], [facility]

## 2022-12-01 NOTE — Telephone Encounter (Signed)
Requested Prescriptions  Pending Prescriptions Disp Refills   lisinopril-hydrochlorothiazide (ZESTORETIC) 20-25 MG tablet [Pharmacy Med Name: LISINOPRIL-HCTZ 20/25MG  TABLETS] 90 tablet 0    Sig: TAKE 1 TABLET BY MOUTH DAILY     Cardiovascular:  ACEI + Diuretic Combos Failed - 11/30/2022  8:09 AM      Failed - Last BP in normal range    BP Readings from Last 1 Encounters:  09/12/22 (!) 142/84         Passed - Na in normal range and within 180 days    Sodium  Date Value Ref Range Status  09/12/2022 139 134 - 144 mmol/L Final         Passed - K in normal range and within 180 days    Potassium  Date Value Ref Range Status  09/12/2022 4.2 3.5 - 5.2 mmol/L Final         Passed - Cr in normal range and within 180 days    Creatinine, Ser  Date Value Ref Range Status  09/12/2022 0.96 0.76 - 1.27 mg/dL Final         Passed - eGFR is 30 or above and within 180 days    GFR calc Af Amer  Date Value Ref Range Status  06/29/2020 116 >59 mL/min/1.73 Final    Comment:    **[facility] currently reports eGFR in compliance with the current**   recommendations of the [employer]. [facility] will   update reporting as new guidelines are published from the NKF-ASN   Task force.    GFR calc non Af Amer  Date Value Ref Range Status  06/29/2020 100 >59 mL/min/1.73 Final   eGFR  Date Value Ref Range Status  09/12/2022 95 >59 mL/min/1.73 Final         Passed - Patient is not pregnant      Passed - Valid encounter within last 6 months    Recent Outpatient Visits           2 months ago Type 2 diabetes mellitus with hyperglycemia, without long-term current use of insulin ([place])   [place], Megan P, DO   6 months ago Eye pain, left   [facility] [place], Malachy Mood, NP   1 year ago Routine general medical examination at a health care facility   [facility], [place], DO   1 year ago Type 2 diabetes mellitus with hyperglycemia,  without long-term current use of insulin ([place])   [place], Megan P, DO   2 years ago Type 2 diabetes mellitus with hyperglycemia, without long-term current use of insulin ([place])   [place], Jessica A, NP       Future Appointments             In 2 weeks Wynetta Emery, Megan P, DO [place], [facility]             metFORMIN (GLUCOPHAGE-XR) 500 MG 24 hr tablet [Pharmacy Med Name: METFORMIN ER 500MG  24HR TABS] 180 tablet 1    Sig: TAKE 2 TABLETS(1000 MG) BY MOUTH TWICE DAILY     Endocrinology:  Diabetes - Biguanides Failed - 11/30/2022  8:09 AM      Failed - B12 Level in normal range and within 720 days    No results found for: "VITAMINB12"       Passed - Cr in normal range and within 360 days    Creatinine, Ser  Date Value Ref Range Status  09/12/2022 0.96 0.76 - 1.27 mg/dL Final         Passed - HBA1C is between 0 and 7.9 and within 180 days    HB A1C (BAYER DCA - WAIVED)  Date Value Ref Range Status  09/12/2022 6.7 (H) 4.8 - 5.6 % Final    Comment:             Prediabetes: 5.7 - 6.4          Diabetes: >6.4          Glycemic control for adults with diabetes: <7.0          Passed - eGFR in normal range and within 360 days    GFR calc Af Amer  Date Value Ref Range Status  06/29/2020 116 >59 mL/min/1.73 Final    Comment:    **[facility] currently reports eGFR in compliance with the current**   recommendations of the [employer]. [facility] will   update reporting as new guidelines are published from the NKF-ASN   Task force.    GFR calc non Af Amer  Date Value Ref Range Status  06/29/2020 100 >59 mL/min/1.73 Final   eGFR  Date Value Ref Range Status  09/12/2022 95 >59 mL/min/1.73 Final         Passed - Valid encounter within last 6 months    Recent Outpatient Visits           2 months ago Type 2 diabetes mellitus with hyperglycemia, without long-term current use of insulin (HCC)   [facility], Megan P, DO   6 months ago Eye pain, left   [facility] Gabriel Cirri, NP   1 year ago Routine general medical examination at a health care facility   [facility] [place] P, DO   1 year ago Type 2 diabetes mellitus with hyperglycemia, without long-term current use of insulin [facility])   [facility] [place], Megan P, DO   2 years ago Type 2 diabetes mellitus with hyperglycemia, without long-term current use of insulin (HCC)   [facility] Valentino Nose, NP       Future Appointments             In 2 weeks Laural Benes, Megan P, DO [facility], [facility]            Passed - CBC within normal limits and completed in the last 12 months    WBC  Date Value Ref Range Status  09/12/2022 4.4 3.4 - 10.8 x10E3/uL Final   RBC  Date Value Ref Range Status  09/12/2022 5.29 4.14 - 5.80 x10E6/uL Final   Hemoglobin  Date Value Ref Range Status  09/12/2022 15.2 13.0 - 17.7 g/dL Final   Hematocrit  Date Value Ref Range Status  09/12/2022 45.4 37.5 - 51.0 % Final   MCHC  Date Value Ref Range Status  09/12/2022 33.5 31.5 - 35.7 g/dL Final   [facility]  Date Value Ref Range Status  09/12/2022 28.7 26.6 - 33.0 pg Final   MCV  Date Value Ref Range Status  09/12/2022 86 79 - 97 fL Final   No results found for: "PLTCOUNTKUC", "LABPLAT", "POCPLA" RDW  Date Value Ref Range Status  09/12/2022 13.0 11.6 - 15.4 % Final

## 2022-12-19 ENCOUNTER — Encounter: Payer: BC Managed Care – PPO | Admitting: Family Medicine

## 2022-12-30 ENCOUNTER — Emergency Department
Admission: EM | Admit: 2022-12-30 | Discharge: 2022-12-30 | Disposition: A | Discharge status: Home / Self Care | Payer: BC Managed Care – PPO | Attending: Emergency Medicine | Admitting: Emergency Medicine

## 2022-12-30 ENCOUNTER — Other Ambulatory Visit: Payer: Self-pay

## 2022-12-30 ENCOUNTER — Encounter: Payer: Self-pay | Admitting: Emergency Medicine

## 2022-12-30 ENCOUNTER — Other Ambulatory Visit: Payer: Self-pay | Admitting: Family Medicine

## 2022-12-30 DIAGNOSIS — K0889 Other specified disorders of teeth and supporting structures: Secondary | ICD-10-CM

## 2022-12-30 DIAGNOSIS — I10 Essential (primary) hypertension: Secondary | ICD-10-CM | POA: Insufficient documentation

## 2022-12-30 DIAGNOSIS — E119 Type 2 diabetes mellitus without complications: Secondary | ICD-10-CM | POA: Diagnosis not present

## 2022-12-30 MED ORDER — OXYCODONE-ACETAMINOPHEN 5-325 MG PO TABS: 1.0000 | ORAL_TABLET | ORAL | 0 refills | Status: DC | PRN

## 2022-12-30 MED ORDER — IBUPROFEN 600 MG PO TABS: 600.0000 mg | ORAL_TABLET | Freq: Four times a day (QID) | ORAL | 0 refills | Status: DC | PRN

## 2022-12-30 MED ORDER — AMOXICILLIN 875 MG PO TABS: 875.0000 mg | ORAL_TABLET | Freq: Two times a day (BID) | ORAL | 0 refills | Status: DC

## 2022-12-30 NOTE — ED Triage Notes (Signed)
Pt here with right upper dental pain. Pt states he was unable to get in to see his dentist and they advised him to come to the ED. Pt states tooth is throbbing.

## 2022-12-30 NOTE — ED Provider Notes (Signed)
[facility] Provider Note    Event Date/Time   First MD Initiated Contact with Patient 12/30/22 [phone]     (approximate)   History   Dental Pain   HPI  Greg Wood is a 52 y.o. male with history of diabetes, hypertension presents emergency department with concerns of right-sided dental pain.  States it is in the upper molar.  Symptoms been ongoing for close to a week.  Is getting worse.  Called his dentist and they cannot fit him in for 2 weeks.  Told him to come in and get antibiotics.  Denies fever or chills      Physical Exam   Triage Vital Signs: ED Triage Vitals [12/30/22 0804]  Enc Vitals Group     BP (!) 177/96     Pulse Rate 70     Resp 20     Temp 98.2 F (36.8 C)     Temp Source Oral     SpO2 100 %     Weight 224 lb 10.4 oz (101.9 kg)     Height 6' (1.829 m)     Head Circumference      Peak Flow      Pain Score 8     Pain Loc      Pain Edu?      Excl. in [place]?     Most recent vital signs: Vitals:   12/30/22 0804  BP: (!) 177/96  Pulse: 70  Resp: 20  Temp: 98.2 F (36.8 C)  SpO2: 100%     General: Awake, no distress.   CV:  Good peripheral perfusion. regular rate and  rhythm Resp:  Normal effort. Lungs cta Abd:  No distention.   Other:  Right upper molar is tender upon palpation with the tongue depressor, right maxillary sinus area is tender to palpation, no swelling noted, neck is supple, no lymphadenopathy   ED Results / Procedures / Treatments   Labs (all labs ordered are listed, but only abnormal results are displayed) Labs Reviewed - No data to display   EKG     RADIOLOGY     PROCEDURES:   Procedures   MEDICATIONS ORDERED IN ED: Medications - No data to display   IMPRESSION / MDM / [place] / ED COURSE  I reviewed the triage vital signs and the nursing notes.                              Differential diagnosis includes, but is not limited to, dental pain, dental abscess,  fractured tooth  Patient's presentation is most consistent with acute, uncomplicated illness.   Due to the amount of pain the patient's been having a feeling may have some infection.  Concerns that the patient will also need a root canal.  Will start with antibiotics, anti-inflammatories and pain medication.  He is to follow-up with his dentist.  Return emergency department or see his regular doctor if not improving 3 to 4 days.  He is in agreement treatment plan.  He was discharged stable condition.      FINAL CLINICAL IMPRESSION(S) / ED DIAGNOSES   Final diagnoses:  Pain, dental     Rx / DC Orders   ED Discharge Orders          Ordered    amoxicillin (AMOXIL) 875 MG tablet  2 times daily        12/30/22 0834  ibuprofen (ADVIL) 600 MG tablet  Every 6 hours PRN        12/30/22 0834    oxyCODONE-acetaminophen (PERCOCET) 5-325 MG tablet  Every 4 hours PRN        12/30/22 0834             Note:  This document was prepared using Dragon voice recognition software and may include unintentional dictation errors.    Versie Starks, PA-C 12/30/22 5701    Blake Divine, MD 12/30/22 [phone]

## 2022-12-31 NOTE — Telephone Encounter (Signed)
Requested Prescriptions  Pending Prescriptions Disp Refills   metFORMIN (GLUCOPHAGE-XR) 500 MG 24 hr tablet [Pharmacy Med Name: METFORMIN ER 500MG  24HR TABS] 360 tablet 0    Sig: TAKE 2 TABLETS BY MOUTH TWICE DAILY     Endocrinology:  Diabetes - Biguanides Failed - 12/30/2022 12:04 PM      Failed - B12 Level in normal range and within 720 days    No results found for: "VITAMINB12"       Passed - Cr in normal range and within 360 days    Creatinine, Ser  Date Value Ref Range Status  09/12/2022 0.96 0.76 - 1.27 mg/dL Final         Passed - HBA1C is between 0 and 7.9 and within 180 days    HB A1C (BAYER DCA - WAIVED)  Date Value Ref Range Status  09/12/2022 6.7 (H) 4.8 - 5.6 % Final    Comment:             Prediabetes: 5.7 - 6.4          Diabetes: >6.4          Glycemic control for adults with diabetes: <7.0          Passed - eGFR in normal range and within 360 days    GFR calc Af Amer  Date Value Ref Range Status  06/29/2020 116 >59 mL/min/1.73 Final    Comment:    **[facility] currently reports eGFR in compliance with the current**   recommendations of the [employer]. [facility] will   update reporting as new guidelines are published from the NKF-ASN   Task force.    GFR calc non Af Amer  Date Value Ref Range Status  06/29/2020 100 >59 mL/min/1.73 Final   eGFR  Date Value Ref Range Status  09/12/2022 95 >59 mL/min/1.73 Final         Passed - Valid encounter within last 6 months    Recent Outpatient Visits           3 months ago Type 2 diabetes mellitus with hyperglycemia, without long-term current use of insulin ([place])   [place], Megan P, DO   7 months ago Eye pain, left   [place] Kathrine Haddock, NP   1 year ago Routine general medical examination at a health care facility   [facility], [place] P, DO   1 year ago Type 2 diabetes mellitus with hyperglycemia,  without long-term current use of insulin [facility])   [place], [place] P, DO   2 years ago Type 2 diabetes mellitus with hyperglycemia, without long-term current use of insulin [facility])   [place] Eulogio Bear, NP              Passed - CBC within normal limits and completed in the last 12 months    WBC  Date Value Ref Range Status  09/12/2022 4.4 3.4 - 10.8 x10E3/uL Final   RBC  Date Value Ref Range Status  09/12/2022 5.29 4.14 - 5.80 x10E6/uL Final   Hemoglobin  Date Value Ref Range Status  09/12/2022 15.2 13.0 - 17.7 g/dL Final   Hematocrit  Date Value Ref Range Status  09/12/2022 45.4 37.5 - 51.0 % Final   MCHC  Date Value Ref Range Status  09/12/2022 33.5 31.5 - 35.7 g/dL Final   [facility]  Date Value Ref Range Status  09/12/2022 28.7 26.6 - 33.0 pg Final   MCV  Date Value Ref Range Status  09/12/2022 86 79 - 97 fL Final   No results found for: "PLTCOUNTKUC", "LABPLAT", "POCPLA" RDW  Date Value Ref Range Status  09/12/2022 13.0 11.6 - 15.4 % Final

## 2023-03-02 ENCOUNTER — Other Ambulatory Visit: Payer: Self-pay

## 2023-03-02 ENCOUNTER — Emergency Department
Admission: EM | Admit: 2023-03-02 | Discharge: 2023-03-02 | Disposition: A | Discharge status: Home / Self Care | Payer: BC Managed Care – PPO | Attending: Emergency Medicine | Admitting: Emergency Medicine

## 2023-03-02 DIAGNOSIS — I10 Essential (primary) hypertension: Secondary | ICD-10-CM | POA: Insufficient documentation

## 2023-03-02 DIAGNOSIS — K0889 Other specified disorders of teeth and supporting structures: Secondary | ICD-10-CM | POA: Diagnosis not present

## 2023-03-02 DIAGNOSIS — E119 Type 2 diabetes mellitus without complications: Secondary | ICD-10-CM | POA: Insufficient documentation

## 2023-03-02 MED ORDER — AMOXICILLIN 875 MG PO TABS: 875.0000 mg | ORAL_TABLET | Freq: Two times a day (BID) | ORAL | 0 refills | Status: DC

## 2023-03-02 MED ORDER — TRAMADOL HCL 50 MG PO TABS: 50.0000 mg | ORAL_TABLET | Freq: Four times a day (QID) | ORAL | 0 refills | Status: DC | PRN

## 2023-03-02 NOTE — ED Provider Notes (Signed)
[facility] Provider Note    Event Date/Time   First MD Initiated Contact with Patient 03/02/23 0848     (approximate)   History   Dental Pain   HPI  Greg Wood is a 52 y.o. male with history of diabetes, hypertension presents emergency department with right-sided tooth pain.  Patient has history of the same.  Was seen here and given an antibiotic which made him feel better.  Has not seen his dentist since then.  States he did call and we will get him in in May.  Denies chest pain shortness of breath.      Physical Exam   Triage Vital Signs: ED Triage Vitals  Enc Vitals Group     BP 03/02/23 0846 (!) 171/91     Pulse Rate 03/02/23 0844 87     Resp 03/02/23 0844 18     Temp 03/02/23 0844 98 F (36.7 C)     Temp Source 03/02/23 0844 Oral     SpO2 03/02/23 0844 99 %     Weight --      Height --      Head Circumference --      Peak Flow --      Pain Score 03/02/23 0844 9     Pain Loc --      Pain Edu? --      Excl. in [place]? --     Most recent vital signs: Vitals:   03/02/23 0844 03/02/23 0846  BP:  (!) 171/91  Pulse: 87   Resp: 18   Temp: 98 F (36.7 C)   SpO2: 99%      General: Awake, no distress.   CV:  Good peripheral perfusion. regular rate and  rhythm Resp:  Normal effort. Lungs cta Abd:  No distention.   Other:  Gum swollen around the right upper posterior molar, questionable if this is a wisdom tooth that has emerged    ED Results / Procedures / Treatments   Labs (all labs ordered are listed, but only abnormal results are displayed) Labs Reviewed - No data to display   EKG     RADIOLOGY     PROCEDURES:   Procedures   MEDICATIONS ORDERED IN ED: Medications - No data to display   IMPRESSION / MDM / [place]ESSMENT AND [place] / ED COURSE  I reviewed the triage vital signs and the nursing notes.                              Differential diagnosis includes, but is not limited to, dental abscess, dental  pain, fractured tooth  Patient's presentation is most consistent with acute, uncomplicated illness.   Patient was given antibiotic and pain medication.  Follow-up with his dentist.  Take over-the-counter Tylenol and ibuprofen.  He is in agreement treatment plan.  Discharged stable condition.      FINAL CLINICAL IMPRESSION(S) / ED DIAGNOSES   Final diagnoses:  Pain, dental     Rx / DC Orders   ED Discharge Orders          Ordered    amoxicillin (AMOXIL) 875 MG tablet  2 times daily        03/02/23 0859    traMADol (ULTRAM) 50 MG tablet  Every 6 hours PRN        03/02/23 0859             Note:  This document  was prepared using Conservation officer, historic buildings and may include unintentional dictation errors.    Faythe Ghee, PA-C 03/02/23 4656    Jene Every, MD 03/02/23 [phone]

## 2023-03-02 NOTE — ED Triage Notes (Addendum)
Pt to Ed via POV from home. Pt reports right sided dental pain since Thursday. Pt is unable to be seen at dentist until May. Pt reports hx of having infected tooth pulled on left side years ago. Pt reports hx of HTN but did not take BP meds this morning

## 2023-03-03 ENCOUNTER — Other Ambulatory Visit: Payer: Self-pay | Admitting: Family Medicine

## 2023-03-04 NOTE — Telephone Encounter (Signed)
Requested Prescriptions  Pending Prescriptions Disp Refills   metFORMIN (GLUCOPHAGE-XR) 500 MG 24 hr tablet [Pharmacy Med Name: METFORMIN ER 500MG  24HR TABS] 360 tablet 0    Sig: TAKE 2 TABLETS BY MOUTH TWICE DAILY     Endocrinology:  Diabetes - Biguanides Failed - 03/03/2023  8:09 AM      Failed - B12 Level in normal range and within 720 days    No results found for: "VITAMINB12"       Passed - Cr in normal range and within 360 days    Creatinine, Ser  Date Value Ref Range Status  09/12/2022 0.96 0.76 - 1.27 mg/dL Final         Passed - HBA1C is between 0 and 7.9 and within 180 days    HB A1C (BAYER DCA - WAIVED)  Date Value Ref Range Status  09/12/2022 6.7 (H) 4.8 - 5.6 % Final    Comment:             Prediabetes: 5.7 - 6.4          Diabetes: >6.4          Glycemic control for adults with diabetes: <7.0          Passed - eGFR in normal range and within 360 days    GFR calc Af Amer  Date Value Ref Range Status  06/29/2020 116 >59 mL/min/1.73 Final    Comment:    **[facility] currently reports eGFR in compliance with the current**   recommendations of the [employer]. [facility] will   update reporting as new guidelines are published from the NKF-ASN   Task force.    GFR calc non Af Amer  Date Value Ref Range Status  06/29/2020 100 >59 mL/min/1.73 Final   eGFR  Date Value Ref Range Status  09/12/2022 95 >59 mL/min/1.73 Final         Passed - Valid encounter within last 6 months    Recent Outpatient Visits           5 months ago Type 2 diabetes mellitus with hyperglycemia, without long-term current use of insulin (HCC)   [place] [facility] [place], Megan P, DO   9 months ago Eye pain, left   [place] [facility] Gabriel Cirri, NP   1 year ago Routine general medical examination at a health care facility   [facility], [place] P, DO   2 years ago Type 2 diabetes mellitus with hyperglycemia,  without long-term current use of insulin [facility])   [place] [facility] [place], [place] P, DO   2 years ago Type 2 diabetes mellitus with hyperglycemia, without long-term current use of insulin [facility])   [place] [facility] [place] Valentino Nose, NP              Passed - CBC within normal limits and completed in the last 12 months    WBC  Date Value Ref Range Status  09/12/2022 4.4 3.4 - 10.8 x10E3/uL Final   RBC  Date Value Ref Range Status  09/12/2022 5.29 4.14 - 5.80 x10E6/uL Final   Hemoglobin  Date Value Ref Range Status  09/12/2022 15.2 13.0 - 17.7 g/dL Final   Hematocrit  Date Value Ref Range Status  09/12/2022 45.4 37.5 - 51.0 % Final   MCHC  Date Value Ref Range Status  09/12/2022 33.5 31.5 - 35.7 g/dL Final   [facility]  Date Value Ref Range Status  09/12/2022 28.7 26.6 - 33.0 pg Final   MCV  Date Value Ref Range Status  09/12/2022 86 79 - 97 fL Final   No results found for: "PLTCOUNTKUC", "LABPLAT", "POCPLA" RDW  Date Value Ref Range Status  09/12/2022 13.0 11.6 - 15.4 % Final          lisinopril-hydrochlorothiazide (ZESTORETIC) 20-25 MG tablet [Pharmacy Med Name: LISINOPRIL-HCTZ 20/25MG  TABLETS] 90 tablet 0    Sig: TAKE 1 TABLET BY MOUTH DAILY     Cardiovascular:  ACEI + Diuretic Combos Failed - 03/03/2023  8:09 AM      Failed - Last BP in normal range    BP Readings from Last 1 Encounters:  03/02/23 (!) 171/91         Passed - Na in normal range and within 180 days    Sodium  Date Value Ref Range Status  09/12/2022 139 134 - 144 mmol/L Final         Passed - K in normal range and within 180 days    Potassium  Date Value Ref Range Status  09/12/2022 4.2 3.5 - 5.2 mmol/L Final         Passed - Cr in normal range and within 180 days    Creatinine, Ser  Date Value Ref Range Status  09/12/2022 0.96 0.76 - 1.27 mg/dL Final         Passed - eGFR is 30 or above and within 180 days    GFR calc Af Amer  Date Value Ref  Range Status  06/29/2020 116 >59 mL/min/1.73 Final    Comment:    **[facility] currently reports eGFR in compliance with the current**   recommendations of the [employer]. [facility] will   update reporting as new guidelines are published from the NKF-ASN   Task force.    GFR calc non Af Amer  Date Value Ref Range Status  06/29/2020 100 >59 mL/min/1.73 Final   eGFR  Date Value Ref Range Status  09/12/2022 95 >59 mL/min/1.73 Final         Passed - Patient is not pregnant      Passed - Valid encounter within last 6 months    Recent Outpatient Visits           5 months ago Type 2 diabetes mellitus with hyperglycemia, without long-term current use of insulin (HCC)   [place] [facility], Megan P, DO   9 months ago Eye pain, left   [place] [facility] Gabriel Cirri, NP   1 year ago Routine general medical examination at a health care facility   [facility], [place] P, DO   2 years ago Type 2 diabetes mellitus with hyperglycemia, without long-term current use of insulin [facility])   [place] [facility], [place] P, DO   2 years ago Type 2 diabetes mellitus with hyperglycemia, without long-term current use of insulin [facility])   [place] [facility] Valentino Nose, NP

## 2023-04-03 ENCOUNTER — Other Ambulatory Visit: Payer: Self-pay | Admitting: Family Medicine

## 2023-04-03 MED ORDER — METFORMIN HCL ER 500 MG PO TB24: ORAL_TABLET | ORAL | 0 refills | Status: DC

## 2023-04-03 NOTE — Telephone Encounter (Signed)
Called pt - LMOM to call call back and schedule appt.

## 2023-04-03 NOTE — Telephone Encounter (Signed)
Courtesy refill. Patient will need an office visit for further refills. Requested Prescriptions  Pending Prescriptions Disp Refills   metFORMIN (GLUCOPHAGE-XR) 500 MG 24 hr tablet 120 tablet 0    Sig: TAKE 2 TABLETS BY MOUTH TWICE DAILY     Endocrinology:  Diabetes - Biguanides Failed - 04/03/2023  4:42 PM      Failed - HBA1C is between 0 and 7.9 and within 180 days    HB A1C (BAYER DCA - WAIVED)  Date Value Ref Range Status  09/12/2022 6.7 (H) 4.8 - 5.6 % Final    Comment:             Prediabetes: 5.7 - 6.4          Diabetes: >6.4          Glycemic control for adults with diabetes: <7.0          Failed - B12 Level in normal range and within 720 days    No results found for: "VITAMINB12"       Failed - Valid encounter within last 6 months    Recent Outpatient Visits           6 months ago Type 2 diabetes mellitus with hyperglycemia, without long-term current use of insulin (HCC)   [place] [facility] [place], Megan P, DO   10 months ago Eye pain, left   [place] [facility] Gabriel Cirri, NP   1 year ago Routine general medical examination at a health care facility   [facility], [place] P, DO   2 years ago Type 2 diabetes mellitus with hyperglycemia, without long-term current use of insulin (HCC)   [place] [facility], [place] P, DO   2 years ago Type 2 diabetes mellitus with hyperglycemia, without long-term current use of insulin [facility])   [place] [facility] Valentino Nose, NP              Passed - Cr in normal range and within 360 days    Creatinine, Ser  Date Value Ref Range Status  09/12/2022 0.96 0.76 - 1.27 mg/dL Final         Passed - eGFR in normal range and within 360 days    GFR calc Af Amer  Date Value Ref Range Status  06/29/2020 116 >59 mL/min/1.73 Final    Comment:    **[facility] currently reports eGFR in compliance with the current**    recommendations of the [employer]. [facility] will   update reporting as new guidelines are published from the NKF-ASN   Task force.    GFR calc non Af Amer  Date Value Ref Range Status  06/29/2020 100 >59 mL/min/1.73 Final   eGFR  Date Value Ref Range Status  09/12/2022 95 >59 mL/min/1.73 Final         Passed - CBC within normal limits and completed in the last 12 months    WBC  Date Value Ref Range Status  09/12/2022 4.4 3.4 - 10.8 x10E3/uL Final   RBC  Date Value Ref Range Status  09/12/2022 5.29 4.14 - 5.80 x10E6/uL Final   Hemoglobin  Date Value Ref Range Status  09/12/2022 15.2 13.0 - 17.7 g/dL Final   Hematocrit  Date Value Ref Range Status  09/12/2022 45.4 37.5 - 51.0 % Final   MCHC  Date Value Ref Range Status  09/12/2022 33.5 31.5 - 35.7 g/dL Final   [facility]  Date Value  Ref Range Status  09/12/2022 28.7 26.6 - 33.0 pg Final   MCV  Date Value Ref Range Status  09/12/2022 86 79 - 97 fL Final   No results found for: "PLTCOUNTKUC", "LABPLAT", "POCPLA" RDW  Date Value Ref Range Status  09/12/2022 13.0 11.6 - 15.4 % Final

## 2023-04-03 NOTE — Telephone Encounter (Signed)
Requested Prescriptions  Refused Prescriptions Disp Refills   metFORMIN (GLUCOPHAGE-XR) 500 MG 24 hr tablet [Pharmacy Med Name: METFORMIN ER 500MG  24HR TABS] 360 tablet     Sig: TAKE 2 TABLETS BY MOUTH TWICE DAILY     Endocrinology:  Diabetes - Biguanides Failed - 04/03/2023  4:51 PM      Failed - HBA1C is between 0 and 7.9 and within 180 days    HB A1C (BAYER DCA - WAIVED)  Date Value Ref Range Status  09/12/2022 6.7 (H) 4.8 - 5.6 % Final    Comment:             Prediabetes: 5.7 - 6.4          Diabetes: >6.4          Glycemic control for adults with diabetes: <7.0          Failed - B12 Level in normal range and within 720 days    No results found for: "VITAMINB12"       Failed - Valid encounter within last 6 months    Recent Outpatient Visits           6 months ago Type 2 diabetes mellitus with hyperglycemia, without long-term current use of insulin (HCC)   [place] [facility] [place], Megan P, DO   10 months ago Eye pain, left   [place] [facility] Gabriel Cirri, NP   1 year ago Routine general medical examination at a health care facility   [facility], [place] P, DO   2 years ago Type 2 diabetes mellitus with hyperglycemia, without long-term current use of insulin (HCC)   [place] [facility] [place], [place] P, DO   2 years ago Type 2 diabetes mellitus with hyperglycemia, without long-term current use of insulin [facility])   [place] [facility] Valentino Nose, NP              Passed - Cr in normal range and within 360 days    Creatinine, Ser  Date Value Ref Range Status  09/12/2022 0.96 0.76 - 1.27 mg/dL Final         Passed - eGFR in normal range and within 360 days    GFR calc Af Amer  Date Value Ref Range Status  06/29/2020 116 >59 mL/min/1.73 Final    Comment:    **[facility] currently reports eGFR in compliance with the current**   recommendations of the [employer]. [facility] will   update reporting as new guidelines are published from the NKF-ASN   Task force.    GFR calc non Af Amer  Date Value Ref Range Status  06/29/2020 100 >59 mL/min/1.73 Final   eGFR  Date Value Ref Range Status  09/12/2022 95 >59 mL/min/1.73 Final         Passed - CBC within normal limits and completed in the last 12 months    WBC  Date Value Ref Range Status  09/12/2022 4.4 3.4 - 10.8 x10E3/uL Final   RBC  Date Value Ref Range Status  09/12/2022 5.29 4.14 - 5.80 x10E6/uL Final   Hemoglobin  Date Value Ref Range Status  09/12/2022 15.2 13.0 - 17.7 g/dL Final   Hematocrit  Date Value Ref Range Status  09/12/2022 45.4 37.5 - 51.0 % Final   MCHC  Date Value Ref Range Status  09/12/2022 33.5 31.5 - 35.7 g/dL Final   [facility]  Date Value Ref Range Status  09/12/2022 28.7 26.6 - 33.0 pg Final   MCV  Date Value Ref Range Status  09/12/2022 86 79 - 97 fL Final   No results found for: "PLTCOUNTKUC", "LABPLAT", "POCPLA" RDW  Date Value Ref Range Status  09/12/2022 13.0 11.6 - 15.4 % Final

## 2023-04-03 NOTE — Telephone Encounter (Signed)
Medication Refill - Medication: metFORMIN (GLUCOPHAGE-XR) 500 MG 24 hr tablet   Has the patient contacted their pharmacy? Yes.   Pharmacy said he was not able to refill as the Rx was closed  Preferred Pharmacy (with phone number or street name):  [id number] [place], [place] - [address] AT [facility] OF [address] [facility] Phone: [phone]  Fax: [phone]     Has the patient been seen for an appointment in the last year OR does the patient have an upcoming appointment? No.  Agent: Please be advised that RX refills may take up to 3 business days. We ask that you follow-up with your pharmacy.  Patient said he is down to 2 pills

## 2023-04-05 ENCOUNTER — Other Ambulatory Visit: Payer: Self-pay | Admitting: Family Medicine

## 2023-04-06 NOTE — Telephone Encounter (Signed)
Courtesy refill. Patient will need an office visit for further refills. Requested Prescriptions  Pending Prescriptions Disp Refills   lisinopril-hydrochlorothiazide (ZESTORETIC) 20-25 MG tablet [Pharmacy Med Name: LISINOPRIL-HCTZ 20/25MG  TABLETS] 30 tablet 0    Sig: TAKE 1 TABLET BY MOUTH DAILY     Cardiovascular:  ACEI + Diuretic Combos Failed - 04/05/2023  8:09 AM      Failed - Na in normal range and within 180 days    Sodium  Date Value Ref Range Status  09/12/2022 139 134 - 144 mmol/L Final         Failed - K in normal range and within 180 days    Potassium  Date Value Ref Range Status  09/12/2022 4.2 3.5 - 5.2 mmol/L Final         Failed - Cr in normal range and within 180 days    Creatinine, Ser  Date Value Ref Range Status  09/12/2022 0.96 0.76 - 1.27 mg/dL Final         Failed - eGFR is 30 or above and within 180 days    GFR calc Af Amer  Date Value Ref Range Status  06/29/2020 116 >59 mL/min/1.73 Final    Comment:    **[facility] currently reports eGFR in compliance with the current**   recommendations of the [employer]. [facility] will   update reporting as new guidelines are published from the NKF-ASN   Task force.    GFR calc non Af Amer  Date Value Ref Range Status  06/29/2020 100 >59 mL/min/1.73 Final   eGFR  Date Value Ref Range Status  09/12/2022 95 >59 mL/min/1.73 Final         Failed - Last BP in normal range    BP Readings from Last 1 Encounters:  03/02/23 (!) 171/91         Failed - Valid encounter within last 6 months    Recent Outpatient Visits           6 months ago Type 2 diabetes mellitus with hyperglycemia, without long-term current use of insulin (HCC)   [place] [facility], Megan P, DO   10 months ago Eye pain, left   [place] [facility] Gabriel Cirri, NP   1 year ago Routine general medical examination at a health care facility   [facility],  [place] P, DO   2 years ago Type 2 diabetes mellitus with hyperglycemia, without long-term current use of insulin (HCC)   [place]  [facility], [place] P, DO   2 years ago Type 2 diabetes mellitus with hyperglycemia, without long-term current use of insulin (HCC)   [place] [facility] Valentino Nose, NP              Passed - Patient is not pregnant       JARDIANCE 25 MG TABS tablet [Pharmacy Med Name: JARDIANCE 25MG  TABLETS] 30 tablet 0    Sig: TAKE 1 TABLET(25 MG) BY MOUTH DAILY     Endocrinology:  Diabetes - SGLT2 Inhibitors Failed - 04/05/2023  8:09 AM      Failed - HBA1C is between 0 and 7.9 and within 180 days    HB A1C (BAYER DCA - WAIVED)  Date Value Ref Range Status  09/12/2022 6.7 (H) 4.8 - 5.6 % Final    Comment:             Prediabetes: 5.7 - 6.4  Diabetes: >6.4          Glycemic control for adults with diabetes: <7.0          Failed - Valid encounter within last 6 months    Recent Outpatient Visits           6 months ago Type 2 diabetes mellitus with hyperglycemia, without long-term current use of insulin (HCC)   [place] [facility] [place], Megan P, DO   10 months ago Eye pain, left   [place] [facility] Gabriel Cirri, NP   1 year ago Routine general medical examination at a health care facility   [facility], [place] P, DO   2 years ago Type 2 diabetes mellitus with hyperglycemia, without long-term current use of insulin [facility])   [place] [facility] [place], [place] P, DO   2 years ago Type 2 diabetes mellitus with hyperglycemia, without long-term current use of insulin [facility])   [place] [facility] Valentino Nose, NP              Passed - Cr in normal range and within 360 days    Creatinine, Ser  Date Value Ref Range Status  09/12/2022 0.96 0.76 - 1.27 mg/dL Final         Passed - eGFR in normal  range and within 360 days    GFR calc Af Amer  Date Value Ref Range Status  06/29/2020 116 >59 mL/min/1.73 Final    Comment:    **[facility] currently reports eGFR in compliance with the current**   recommendations of the [employer]. [facility] will   update reporting as new guidelines are published from the NKF[employer]ASN   Task force.    GFR calc non Af Amer  Date Value Ref Range Status  06/29/2020 100 >59 mL/min/1.73 Final   eGFR  Date Value Ref Range Status  09/12/2022 95 >59 mL/min/1.73 Final

## 2023-04-06 NOTE — Telephone Encounter (Signed)
Called pt  - left message on machine to return call and schedule appt.

## 2023-05-01 ENCOUNTER — Other Ambulatory Visit: Payer: Self-pay | Admitting: Family Medicine

## 2023-05-01 NOTE — Telephone Encounter (Signed)
Patient is overdue for an appointment. Please call to schedule and then route to the provider for refill.  

## 2023-05-01 NOTE — Telephone Encounter (Signed)
Requested medications are due for refill today.  yes  Requested medications are on the active medications list.  yes  Last refill. 04/03/2023 #120 0 rf  Future visit scheduled.   no  Notes to clinic.  Pt already given a courtesy refill.    Requested Prescriptions  Pending Prescriptions Disp Refills   metFORMIN (GLUCOPHAGE-XR) 500 MG 24 hr tablet [Pharmacy Med Name: METFORMIN ER 500MG  24HR TABS] 120 tablet 0    Sig: TAKE 2 TABLETS BY MOUTH TWICE DAILY     Endocrinology:  Diabetes - Biguanides Failed - 05/01/2023  3:26 AM      Failed - HBA1C is between 0 and 7.9 and within 180 days    HB A1C (BAYER DCA - WAIVED)  Date Value Ref Range Status  09/12/2022 6.7 (H) 4.8 - 5.6 % Final    Comment:             Prediabetes: 5.7 - 6.4          Diabetes: >6.4          Glycemic control for adults with diabetes: <7.0          Failed - B12 Level in normal range and within 720 days    No results found for: "VITAMINB12"       Failed - Valid encounter within last 6 months    Recent Outpatient Visits           7 months ago Type 2 diabetes mellitus with hyperglycemia, without long-term current use of insulin (HCC)   [place] [facility] [place], Megan P, DO   11 months ago Eye pain, left   [place] [facility] Gabriel Cirri, NP   1 year ago Routine general medical examination at a health care facility   [facility], [place] P, DO   2 years ago Type 2 diabetes mellitus with hyperglycemia, without long-term current use of insulin (HCC)   [place] [facility] [place], [place] P, DO   2 years ago Type 2 diabetes mellitus with hyperglycemia, without long-term current use of insulin [facility])   [place] [facility] Valentino Nose, NP              Passed - Cr in normal range and within 360 days    Creatinine, Ser  Date Value Ref Range Status  09/12/2022 0.96 0.76 - 1.27 mg/dL Final         Passed  - eGFR in normal range and within 360 days    GFR calc Af Amer  Date Value Ref Range Status  06/29/2020 116 >59 mL/min/1.73 Final    Comment:    **[facility] currently reports eGFR in compliance with the current**   recommendations of the [employer]. [facility] will   update reporting as new guidelines are published from the NKF-ASN   Task force.    GFR calc non Af Amer  Date Value Ref Range Status  06/29/2020 100 >59 mL/min/1.73 Final   eGFR  Date Value Ref Range Status  09/12/2022 95 >59 mL/min/1.73 Final         Passed - CBC within normal limits and completed in the last 12 months    WBC  Date Value Ref Range Status  09/12/2022 4.4 3.4 - 10.8 x10E3/uL Final   RBC  Date Value Ref Range Status  09/12/2022 5.29 4.14 - 5.80 x10E6/uL Final   Hemoglobin  Date Value Ref Range Status  09/12/2022 15.2  13.0 - 17.7 g/dL Final   Hematocrit  Date Value Ref Range Status  09/12/2022 45.4 37.5 - 51.0 % Final   MCHC  Date Value Ref Range Status  09/12/2022 33.5 31.5 - 35.7 g/dL Final   [facility]  Date Value Ref Range Status  09/12/2022 28.7 26.6 - 33.0 pg Final   MCV  Date Value Ref Range Status  09/12/2022 86 79 - 97 fL Final   No results found for: "PLTCOUNTKUC", "LABPLAT", "POCPLA" RDW  Date Value Ref Range Status  09/12/2022 13.0 11.6 - 15.4 % Final

## 2023-05-01 NOTE — Telephone Encounter (Signed)
Attempted to reach patient to get him scheduled for an appointment for follow up, LVM stating this information.  Put in CRM.

## 2023-05-05 ENCOUNTER — Other Ambulatory Visit: Payer: Self-pay | Admitting: Family Medicine

## 2023-05-05 NOTE — Telephone Encounter (Signed)
Medication Refill - Medication: lisinopril-hydrochlorothiazide (ZESTORETIC) 20-25 MG tablet, metFORMIN (GLUCOPHAGE-XR) 500 MG 24 hr tablet, and empagliflozin (JARDIANCE) 25 MG TABS tablet   Has the patient contacted their pharmacy? Yes.   Pt told to contact provider  Preferred Pharmacy (with phone number or street name):  [facility] #[id number] [place], [place] - [address] AT [facility] OF [address] Phone: [phone]  Fax: [phone]     Has the patient been seen for an appointment in the last year OR does the patient have an upcoming appointment? Yes.    Agent: Please be advised that RX refills may take up to 3 business days. We ask that you follow-up with your pharmacy.

## 2023-05-06 NOTE — Telephone Encounter (Signed)
Requested medication (s) are due for refill today: yes  Requested medication (s) are on the active medication list: yes  Last refill:  04/06/23  Future visit scheduled: yes  Notes to clinic:  Unable to refill per protocol, courtesy refill already given, routing for provider approval.      Requested Prescriptions  Pending Prescriptions Disp Refills   lisinopril-hydrochlorothiazide (ZESTORETIC) 20-25 MG tablet 30 tablet 0    Sig: Take 1 tablet by mouth daily.     Cardiovascular:  ACEI + Diuretic Combos Failed - 05/05/2023  5:53 PM      Failed - Na in normal range and within 180 days    Sodium  Date Value Ref Range Status  09/12/2022 139 134 - 144 mmol/L Final         Failed - K in normal range and within 180 days    Potassium  Date Value Ref Range Status  09/12/2022 4.2 3.5 - 5.2 mmol/L Final         Failed - Cr in normal range and within 180 days    Creatinine, Ser  Date Value Ref Range Status  09/12/2022 0.96 0.76 - 1.27 mg/dL Final         Failed - eGFR is 30 or above and within 180 days    GFR calc Af Amer  Date Value Ref Range Status  06/29/2020 116 >59 mL/min/1.73 Final    Comment:    **[facility] currently reports eGFR in compliance with the current**   recommendations of the [employer]. [facility] will   update reporting as new guidelines are published from the NKF-ASN   Task force.    GFR calc non Af Amer  Date Value Ref Range Status  06/29/2020 100 >59 mL/min/1.73 Final   eGFR  Date Value Ref Range Status  09/12/2022 95 >59 mL/min/1.73 Final         Failed - Last BP in normal range    BP Readings from Last 1 Encounters:  03/02/23 (!) 171/91         Failed - Valid encounter within last 6 months    Recent Outpatient Visits           7 months ago Type 2 diabetes mellitus with hyperglycemia, without long-term current use of insulin (HCC)   [place] [facility] [place], Megan P, DO   11 months ago Eye pain, left   [facility] Gabriel Cirri, NP   1 year ago Routine general medical examination at a health care facility   [facility], [place] P, DO   2 years ago Type 2 diabetes mellitus with hyperglycemia, without long-term current use of insulin [facility])   [place] [facility] [place], [place] P, DO   2 years ago Type 2 diabetes mellitus with hyperglycemia, without long-term current use of insulin [facility])   [place] [facility] Valentino Nose, NP       Future Appointments             In 2 weeks Laural Benes, Oralia Rud, DO  [facility]            Passed - Patient is not pregnant       metFORMIN (GLUCOPHAGE-XR) 500 MG 24 hr tablet 120 tablet 0    Sig: TAKE 2 TABLETS BY MOUTH TWICE DAILY     Endocrinology:  Diabetes - Biguanides Failed - 05/05/2023  5:53 PM  Failed - HBA1C is between 0 and 7.9 and within 180 days    HB A1C (BAYER DCA - WAIVED)  Date Value Ref Range Status  09/12/2022 6.7 (H) 4.8 - 5.6 % Final    Comment:             Prediabetes: 5.7 - 6.4          Diabetes: >6.4          Glycemic control for adults with diabetes: <7.0          Failed - B12 Level in normal range and within 720 days    No results found for: "VITAMINB12"       Failed - Valid encounter within last 6 months    Recent Outpatient Visits           7 months ago Type 2 diabetes mellitus with hyperglycemia, without long-term current use of insulin (HCC)   [place] [facility] [place], Megan P, DO   11 months ago Eye pain, left   [place] [facility] Gabriel Cirri, NP   1 year ago Routine general medical examination at a health care facility   [facility], [place] P, DO   2 years ago Type 2 diabetes mellitus with hyperglycemia, without long-term current use of insulin (HCC)   [place] [facility] [place], [place] P, DO   2  years ago Type 2 diabetes mellitus with hyperglycemia, without long-term current use of insulin [facility])   [place] [facility] Valentino Nose, NP       Future Appointments             In 2 weeks Laural Benes, Oralia Rud, DO  [facility]            Passed - Cr in normal range and within 360 days    Creatinine, Ser  Date Value Ref Range Status  09/12/2022 0.96 0.76 - 1.27 mg/dL Final         Passed - eGFR in normal range and within 360 days    GFR calc Af Amer  Date Value Ref Range Status  06/29/2020 116 >59 mL/min/1.73 Final    Comment:    **[facility] currently reports eGFR in compliance with the current**   recommendations of the [employer]. [facility] will   update reporting as new guidelines are published from the NKF-ASN   Task force.    GFR calc non Af Amer  Date Value Ref Range Status  06/29/2020 100 >59 mL/min/1.73 Final   eGFR  Date Value Ref Range Status  09/12/2022 95 >59 mL/min/1.73 Final         Passed - CBC within normal limits and completed in the last 12 months    WBC  Date Value Ref Range Status  09/12/2022 4.4 3.4 - 10.8 x10E3/uL Final   RBC  Date Value Ref Range Status  09/12/2022 5.29 4.14 - 5.80 x10E6/uL Final   Hemoglobin  Date Value Ref Range Status  09/12/2022 15.2 13.0 - 17.7 g/dL Final   Hematocrit  Date Value Ref Range Status  09/12/2022 45.4 37.5 - 51.0 % Final   MCHC  Date Value Ref Range Status  09/12/2022 33.5 31.5 - 35.7 g/dL Final   [facility]  Date Value Ref Range Status  09/12/2022 28.7 26.6 - 33.0 pg Final   MCV  Date Value Ref Range Status  09/12/2022 86 79 - 97 fL Final  No results found for: "PLTCOUNTKUC", "LABPLAT", "POCPLA" RDW  Date Value Ref Range Status  09/12/2022 13.0 11.6 - 15.4 % Final          empagliflozin (JARDIANCE) 25 MG TABS tablet 30 tablet 0    Sig: TAKE 1 TABLET(25 MG) BY MOUTH DAILY     Endocrinology:  Diabetes - SGLT2 Inhibitors Failed -  05/05/2023  5:53 PM      Failed - HBA1C is between 0 and 7.9 and within 180 days    HB A1C (BAYER DCA - WAIVED)  Date Value Ref Range Status  09/12/2022 6.7 (H) 4.8 - 5.6 % Final    Comment:             Prediabetes: 5.7 - 6.4          Diabetes: >6.4          Glycemic control for adults with diabetes: <7.0          Failed - Valid encounter within last 6 months    Recent Outpatient Visits           7 months ago Type 2 diabetes mellitus with hyperglycemia, without long-term current use of insulin (HCC)   [place] [facility] [place], Megan P, DO   11 months ago Eye pain, left   [place] [facility] Gabriel Cirri, NP   1 year ago Routine general medical examination at a health care facility   [facility], [place] P, DO   2 years ago Type 2 diabetes mellitus with hyperglycemia, without long-term current use of insulin (HCC)   [place] [facility] [place], [place] P, DO   2 years ago Type 2 diabetes mellitus with hyperglycemia, without long-term current use of insulin [facility])   [place] [facility] Valentino Nose, NP       Future Appointments             In 2 weeks Laural Benes, Oralia Rud, DO [facility]            Passed - Cr in normal range and within 360 days    Creatinine, Ser  Date Value Ref Range Status  09/12/2022 0.96 0.76 - 1.27 mg/dL Final         Passed - eGFR in normal range and within 360 days    GFR calc Af Amer  Date Value Ref Range Status  06/29/2020 116 >59 mL/min/1.73 Final    Comment:    **[facility] currently reports eGFR in compliance with the current**   recommendations of the [employer]. [facility] will   update reporting as new guidelines are published from the NKF-ASN   Task force.    GFR calc non Af Amer  Date Value Ref Range Status  06/29/2020 100 >59 mL/min/1.73 Final   eGFR  Date Value Ref Range Status   09/12/2022 95 >59 mL/min/1.73 Final

## 2023-05-07 MED ORDER — LISINOPRIL-HYDROCHLOROTHIAZIDE 20-25 MG PO TABS: 1.0000 | ORAL_TABLET | Freq: Every day | ORAL | 0 refills | Status: DC

## 2023-05-07 MED ORDER — METFORMIN HCL ER 500 MG PO TB24: ORAL_TABLET | ORAL | 0 refills | Status: DC

## 2023-05-07 MED ORDER — EMPAGLIFLOZIN 25 MG PO TABS: ORAL_TABLET | ORAL | 0 refills | Status: DC

## 2023-05-07 NOTE — Telephone Encounter (Signed)
Patient has upcoming appointment 05/22/23, does not have enough medication to get to the appointment.

## 2023-05-20 ENCOUNTER — Other Ambulatory Visit: Payer: Self-pay | Admitting: Family Medicine

## 2023-05-20 NOTE — Telephone Encounter (Signed)
Attempted to contact [facility] pharmacy to review receipt confirmed 05/07/23 at 4:23 pm. No answer after long hold.

## 2023-05-21 NOTE — Telephone Encounter (Signed)
Refilled 05/07/23 # 60. Requested Prescriptions  Refused Prescriptions Disp Refills   metFORMIN (GLUCOPHAGE-XR) 500 MG 24 hr tablet [Pharmacy Med Name: METFORMIN ER 500MG  24HR TABS] 60 tablet 0    Sig: TAKE 2 TABLETS BY MOUTH TWICE DAILY     Endocrinology:  Diabetes - Biguanides Failed - 05/20/2023  3:15 AM      Failed - HBA1C is between 0 and 7.9 and within 180 days    HB A1C (BAYER DCA - WAIVED)  Date Value Ref Range Status  09/12/2022 6.7 (H) 4.8 - 5.6 % Final    Comment:             Prediabetes: 5.7 - 6.4          Diabetes: >6.4          Glycemic control for adults with diabetes: <7.0          Failed - B12 Level in normal range and within 720 days    No results found for: "VITAMINB12"       Failed - Valid encounter within last 6 months    Recent Outpatient Visits           8 months ago Type 2 diabetes mellitus with hyperglycemia, without long-term current use of insulin (HCC)   [place] [facility] [place], Megan P, DO   11 months ago Eye pain, left   [place] [facility] Gabriel Cirri, NP   1 year ago Routine general medical examination at a health care facility   [facility], [place] P, DO   2 years ago Type 2 diabetes mellitus with hyperglycemia, without long-term current use of insulin (HCC)   [place] [facility] [place], [place] P, DO   2 years ago Type 2 diabetes mellitus with hyperglycemia, without long-term current use of insulin [facility])   [place] [facility] Valentino Nose, NP       Future Appointments             [place] Dor[place] Carrow, DO [place] [facility]            Passed - Cr in normal range and within 360 days    Creatinine, Ser  Date Value Ref Range Status  09/12/2022 0.96 0.76 - 1.27 mg/dL Final         Passed - eGFR in normal range and within 360 days    GFR calc Af Amer  Date Value Ref Range Status  06/29/2020 116  >59 mL/min/1.73 Final    Comment:    **[facility] currently reports eGFR in compliance with the current**   recommendations of the [employer]. [facility] will   update reporting as new guidelines are published from the NKF-ASN   Task force.    GFR calc non Af Amer  Date Value Ref Range Status  06/29/2020 100 >59 mL/min/1.73 Final   eGFR  Date Value Ref Range Status  09/12/2022 95 >59 mL/min/1.73 Final         Passed - CBC within normal limits and completed in the last 12 months    WBC  Date Value Ref Range Status  09/12/2022 4.4 3.4 - 10.8 x10E3/uL Final   RBC  Date Value Ref Range Status  09/12/2022 5.29 4.14 - 5.80 x10E6/uL Final   Hemoglobin  Date Value Ref Range Status  09/12/2022 15.2 13.0 - 17.7 g/dL Final   Hematocrit  Date Value Ref Range Status  09/12/2022 45.4 37.5 - 51.0 % Final   MCHC  Date Value Ref Range Status  09/12/2022 33.5 31.5 - 35.7 g/dL Final   [facility]  Date Value Ref Range Status  09/12/2022 28.7 26.6 - 33.0 pg Final   MCV  Date Value Ref Range Status  09/12/2022 86 79 - 97 fL Final   No results found for: "PLTCOUNTKUC", "LABPLAT", "POCPLA" RDW  Date Value Ref Range Status  09/12/2022 13.0 11.6 - 15.4 % Final

## 2023-05-22 ENCOUNTER — Ambulatory Visit: Payer: BC Managed Care – PPO | Admitting: Family Medicine

## 2023-05-22 ENCOUNTER — Encounter: Payer: Self-pay | Admitting: Family Medicine

## 2023-05-22 ENCOUNTER — Other Ambulatory Visit: Payer: Self-pay | Admitting: Family Medicine

## 2023-05-22 VITALS — BP 134/83 | HR 70 | Temp 99.5°F | Ht 72.0 in | Wt 223.2 lb

## 2023-05-22 DIAGNOSIS — E1165 Type 2 diabetes mellitus with hyperglycemia: Secondary | ICD-10-CM

## 2023-05-22 DIAGNOSIS — I129 Hypertensive chronic kidney disease with stage 1 through stage 4 chronic kidney disease, or unspecified chronic kidney disease: Secondary | ICD-10-CM

## 2023-05-22 DIAGNOSIS — Z7984 Long term (current) use of oral hypoglycemic drugs: Secondary | ICD-10-CM | POA: Diagnosis not present

## 2023-05-22 DIAGNOSIS — Z0000 Encounter for general adult medical examination without abnormal findings: Secondary | ICD-10-CM | POA: Diagnosis not present

## 2023-05-22 DIAGNOSIS — E782 Mixed hyperlipidemia: Secondary | ICD-10-CM

## 2023-05-22 LAB — MICROALBUMIN, URINE WAIVED
Creatinine, Urine Waived: 100 mg/dL (ref 10–300)
Microalb, Ur Waived: 10 mg/L (ref 0–19)
Microalb/Creat Ratio: <30 mg/g (ref <30)

## 2023-05-22 LAB — URINALYSIS, ROUTINE W REFLEX MICROSCOPIC
Bilirubin, UA: NEGATIVE
Glucose, UA: NEGATIVE
Ketones, UA: NEGATIVE
Leukocytes,UA: NEGATIVE
Nitrite, UA: NEGATIVE
Protein,UA: NEGATIVE
RBC, UA: NEGATIVE
Specific Gravity, UA: 1.02 (ref 1.005–1.030)
Urobilinogen, Ur: 0.2 mg/dL (ref 0.2–1.0)
pH, UA: 5 (ref 5.0–7.5)

## 2023-05-22 LAB — BAYER DCA HB A1C WAIVED: HB A1C (BAYER DCA - WAIVED): 7.1 % — ABNORMAL HIGH (ref 4.8–5.6)

## 2023-05-22 NOTE — Assessment & Plan Note (Signed)
Under good control on current regimen. Continue current regimen. Continue to monitor. Call with any concerns. Refills given. Labs drawn today.   

## 2023-05-22 NOTE — Assessment & Plan Note (Signed)
Doing well with A1c of 7.1. Has not been taking his jardiance. Will restart it. Call with any concerns. Continue to monitor.

## 2023-05-22 NOTE — Progress Notes (Signed)
BP 134/83 (BP Location: Left Arm, Cuff Size: Normal)   Pulse 70   Temp 99.5 F (37.5 C) (Oral)   Ht 6' (1.829 m)   Wt 223 lb 3.2 oz (101.2 kg)   SpO2 97%   BMI 30.27 kg/m    Subjective:    Patient ID: Greg Wood, male    DOB: June 04, 1971, 52 y.o.   MRN: [id number]  HPI: Greg Wood is a 52 y.o. male presenting on 05/22/2023 for comprehensive medical examination. Current medical complaints include:  DIABETES Hypoglycemic episodes:no Polydipsia/polyuria: no Visual disturbance: no Chest pain: no Paresthesias: no Glucose Monitoring: no  Accucheck frequency: Not Checking Taking Insulin?: no Blood Pressure Monitoring: not checking Retinal Examination: Up to Date Foot Exam: Up to Date Diabetic Education: Completed Pneumovax: Up to Date Influenza: Up to Date Aspirin: no  HYPERTENSION / HYPERLIPIDEMIA Satisfied with current treatment? no Duration of hypertension: years BP monitoring frequency: not checking BP medication side effects: no Past BP meds: lisinopril-HCTZ Duration of hyperlipidemia: chronic Cholesterol medication side effects: no Cholesterol supplements: none Past cholesterol medications: crestor Medication compliance: excellent compliance Aspirin: no Recent stressors: no Recurrent headaches: no Visual changes: no Palpitations: no Dyspnea: no Chest pain: no Lower extremity edema: no Dizzy/lightheaded: no   He currently lives with: wife Interim Problems from his last visit: no  Depression Screen done today and results listed below:     05/22/2023    1:40 PM 09/12/2022   10:10 AM 06/03/2022   10:46 AM 10/11/2021   11:28 AM 06/29/2020    8:31 AM  Depression screen PHQ 2/9  Decreased Interest 0 0 0 0 0  Down, Depressed, Hopeless 0 0 0 0 0  PHQ - 2 Score 0 0 0 0 0  Altered sleeping 0 0 0 0 0  Tired, decreased energy 0 0 0 0 0  Change in appetite 0 0 0 0 0  Feeling bad or failure about yourself  0 0 0 0 0  Trouble concentrating 0 0 0 0 0   Moving slowly or fidgety/restless 0 0 0 0 0  Suicidal thoughts 0 0 0 0 0  PHQ-9 Score 0 0 0 0 0  Difficult doing work/chores Not difficult at all Not difficult at all Not difficult at all      Past Medical History:  Past Medical History:  Diagnosis Date   Acute bronchitis    Acute lateral meniscus tear of right knee    Acute medial meniscus tear of right knee    Allergic rhinitis due to pollen    Blood in the urine    Diabetes mellitus without complication (HCC)    Hypercalcemia    Resolved on recheck   Hyperlipidemia    Hypertension    Right medial tibial plateau fracture, closed, initial encounter     Surgical History:  Past Surgical History:  Procedure Laterality Date   CHONDROPLASTY Right 08/04/2018   Procedure: CHONDROPLASTY;  Surgeon: Salvatore Marvel, MD;  Location: [facility];  Service: Orthopedics;  Laterality: Right;   KNEE ARTHROSCOPY WITH LATERAL MENISECTOMY Right 08/04/2018   Procedure: KNEE ARTHROSCOPY WITH LATERAL MENISECTOMY;  Surgeon: Salvatore Marvel, MD;  Location: [facility];  Service: Orthopedics;  Laterality: Right;   KNEE ARTHROSCOPY WITH MEDIAL MENISECTOMY Right 08/04/2018   Procedure: KNEE ARTHROSCOPY WITH MEDIAL MENISECTOMY;  Surgeon: Salvatore Marvel, MD;  Location: [facility];  Service: Orthopedics;  Laterality: Right;   KNEE ARTHROSCOPY WITH SUBCHONDROPLASTY Right 08/04/2018   Procedure:  CHONDROPLASTY AND SUBCHONDROPLASTY MEDIAL TIBIAL PLATEAU;  Surgeon: Salvatore Marvel, MD;  Location: [facility];  Service: Orthopedics;  Laterality: Right;    Medications:  Current Outpatient Medications on File Prior to Visit  Medication Sig   Blood Glucose Monitoring Suppl (CONTOUR NEXT MONITOR) w/Device KIT USE PER PACKAGE INSTRUCTIONS   glucose blood test strip 1 each by Other route as needed for other. Use as instructed   lisinopril-hydrochlorothiazide (ZESTORETIC) 20-25 MG tablet Take 1 tablet by mouth daily.    metFORMIN (GLUCOPHAGE-XR) 500 MG 24 hr tablet TAKE 2 TABLETS BY MOUTH TWICE DAILY   rosuvastatin (CRESTOR) 20 MG tablet Take 1 tablet (20 mg total) by mouth once a week.   empagliflozin (JARDIANCE) 25 MG TABS tablet TAKE 1 TABLET(25 MG) BY MOUTH DAILY (Patient not taking: Reported on 05/22/2023)   No current facility-administered medications on file prior to visit.    Allergies:  No Known Allergies  Social History:  Social History   Socioeconomic History   Marital status: Single    Spouse name: Not on file   Number of children: Not on file   Years of education: Not on file   Highest education level: Not on file  Occupational History   Not on file  Tobacco Use   Smoking status: Never   Smokeless tobacco: Never  Vaping Use   Vaping Use: Never used  Substance and Sexual Activity   Alcohol use: No   Drug use: No   Sexual activity: Yes    Birth control/protection: None  Other Topics Concern   Not on file  Social History Narrative   Not on file   Social Determinants of Health   Financial Resource Strain: Not on file  Food Insecurity: Not on file  Transportation Needs: Not on file  Physical Activity: Not on file  Stress: Not on file  Social Connections: Not on file  Intimate Partner Violence: Not on file   Social History   Tobacco Use  Smoking Status Never  Smokeless Tobacco Never   Social History   Substance and Sexual Activity  Alcohol Use No    Family History:  Family History  Problem Relation Age of Onset   Diabetes Mother    Diabetes Maternal Grandmother     Past medical history, surgical history, medications, allergies, family history and social history reviewed with patient today and changes made to appropriate areas of the chart.   Review of Systems  Constitutional: Negative.   HENT: Negative.    Eyes: Negative.   Respiratory: Negative.    Cardiovascular: Negative.   Gastrointestinal: Negative.   Genitourinary: Negative.   Musculoskeletal:   Positive for myalgias. Negative for back pain, falls, joint pain and neck pain.  Skin: Negative.   Neurological: Negative.   Endo/Heme/Allergies: Negative.   Psychiatric/Behavioral: Negative.     All other ROS negative except what is listed above and in the HPI.      Objective:    BP 134/83 (BP Location: Left Arm, Cuff Size: Normal)   Pulse 70   Temp 99.5 F (37.5 C) (Oral)   Ht 6' (1.829 m)   Wt 223 lb 3.2 oz (101.2 kg)   SpO2 97%   BMI 30.27 kg/m   Wt Readings from Last 3 Encounters:  05/22/23 223 lb 3.2 oz (101.2 kg)  12/30/22 224 lb 10.4 oz (101.9 kg)  09/12/22 224 lb 11.2 oz (101.9 kg)    Physical Exam Vitals and nursing note reviewed.  Constitutional:  General: He is not in acute distress.    Appearance: Normal appearance. He is obese. He is not ill-appearing, toxic-appearing or diaphoretic.  HENT:     Head: Normocephalic and atraumatic.     Right Ear: Tympanic membrane, ear canal and external ear normal. There is no impacted cerumen.     Left Ear: Tympanic membrane, ear canal and external ear normal. There is no impacted cerumen.     Nose: Nose normal. No congestion or rhinorrhea.     Mouth/Throat:     Mouth: Mucous membranes are moist.     Pharynx: Oropharynx is clear. No oropharyngeal exudate or posterior oropharyngeal erythema.  Eyes:     General: No scleral icterus.       Right eye: No discharge.        Left eye: No discharge.     Extraocular Movements: Extraocular movements intact.     Conjunctiva/sclera: Conjunctivae normal.     Pupils: Pupils are equal, round, and reactive to light.  Neck:     Vascular: No carotid bruit.  Cardiovascular:     Rate and Rhythm: Normal rate and regular rhythm.     Pulses: Normal pulses.     Heart sounds: No murmur heard.    No friction rub. No gallop.  Pulmonary:     Effort: Pulmonary effort is normal. No respiratory distress.     Breath sounds: Normal breath sounds. No stridor. No wheezing, rhonchi or rales.   Chest:     Chest wall: No tenderness.  Abdominal:     General: Abdomen is flat. Bowel sounds are normal. There is no distension.     Palpations: Abdomen is soft. There is no mass.     Tenderness: There is no abdominal tenderness. There is no right CVA tenderness, left CVA tenderness, guarding or rebound.     Hernia: No hernia is present.  Genitourinary:    Comments: Genital exam deferred with shared decision making Musculoskeletal:        General: No swelling, tenderness, deformity or signs of injury.     Cervical back: Normal range of motion and neck supple. No rigidity. No muscular tenderness.     Right lower leg: No edema.     Left lower leg: No edema.  Lymphadenopathy:     Cervical: No cervical adenopathy.  Skin:    General: Skin is warm and dry.     Capillary Refill: Capillary refill takes less than 2 seconds.     Coloration: Skin is not jaundiced or pale.     Findings: No bruising, erythema, lesion or rash.  Neurological:     General: No focal deficit present.     Mental Status: He is alert and oriented to person, place, and time.     Cranial Nerves: No cranial nerve deficit.     Sensory: No sensory deficit.     Motor: No weakness.     Coordination: Coordination normal.     Gait: Gait normal.     Deep Tendon Reflexes: Reflexes normal.  Psychiatric:        Mood and Affect: Mood normal.        Behavior: Behavior normal.        Thought Content: Thought content normal.        Judgment: Judgment normal.     Results for orders placed or performed in visit on 05/22/23  Bayer DCA Hb A1c Waived  Result Value Ref Range   HB A1C (BAYER DCA - WAIVED) 7.1 (H) 4.8 -  5.6 %  Microalbumin, Urine Waived  Result Value Ref Range   Microalb, Ur Waived 10 0 - 19 mg/L   Creatinine, Urine Waived 100 10 - 300 mg/dL   Microalb/Creat Ratio <30 <30 mg/g  Urinalysis, Routine w reflex microscopic  Result Value Ref Range   Specific Gravity, UA 1.020 1.005 - 1.030   pH, UA 5.0 5.0 - 7.5    Color, UA Yellow Yellow   Appearance Ur Clear Clear   Leukocytes,UA Negative Negative   Protein,UA Negative Negative/Trace   Glucose, UA Negative Negative   Ketones, UA Negative Negative   RBC, UA Negative Negative   Bilirubin, UA Negative Negative   Urobilinogen, Ur 0.2 0.2 - 1.0 mg/dL   Nitrite, UA Negative Negative   Microscopic Examination Comment       Assessment & Plan:   Problem List Items Addressed This Visit       Endocrine   Diabetes mellitus with hyperglycemia (HCC)    Doing well with A1c of 7.1. Has not been taking his jardiance. Will restart it. Call with any concerns. Continue to monitor.       Relevant Orders   Bayer DCA Hb A1c Waived (Completed)     Genitourinary   Benign hypertensive renal disease    Under good control on current regimen. Continue current regimen. Continue to monitor. Call with any concerns. Refills given. Labs drawn today.        Relevant Orders   Microalbumin, Urine Waived (Completed)     Other   Hyperlipidemia    Under good control on current regimen. Continue current regimen. Continue to monitor. Call with any concerns. Refills given. Labs drawn today.       Other Visit Diagnoses     Routine general medical examination at a health care facility    -  Primary   Vaccines up to date. Screening labs checked today. Will do cologuard- has it at home. Conitnue diet and exercise. Call with any concerns.   Relevant Orders   Bayer DCA Hb A1c Waived (Completed)   CBC with Differential/Platelet   Comprehensive metabolic panel   Lipid Panel w/o Chol/HDL Ratio   Microalbumin, Urine Waived (Completed)   TSH   Urinalysis, Routine w reflex microscopic (Completed)   PSA       LABORATORY TESTING:  Health maintenance labs ordered today as discussed above.   The natural history of prostate cancer and ongoing controversy regarding screening and potential treatment outcomes of prostate cancer has been discussed with the patient. The meaning  of a false positive PSA and a false negative PSA has been discussed. He indicates understanding of the limitations of this screening test and wishes to proceed with screening PSA testing.   IMMUNIZATIONS:   - Tdap: Tetanus vaccination status reviewed: last tetanus booster within 10 years. - Influenza: Postponed to flu season - Pneumovax: Up to date - Prevnar: Not applicable - COVID: Up to date - HPV: Not applicable - Shingrix vaccine: Refused  SCREENING: - Colonoscopy: has cologuard at home. Will do.   Discussed with patient purpose of the colonoscopy is to detect colon cancer at curable precancerous or early stages    PATIENT COUNSELING:    Sexuality: Discussed sexually transmitted diseases, partner selection, use of condoms, avoidance of unintended pregnancy  and contraceptive alternatives.   Advised to avoid cigarette smoking.  I discussed with the patient that most people either abstain from alcohol or drink within safe limits (<=14/week and <=4 drinks/occasion for males, <=7/weeks and <=  3 drinks/occasion for females) and that the risk for alcohol disorders and other health effects rises proportionally with the number of drinks per week and how often a drinker exceeds daily limits.  Discussed cessation/primary prevention of drug use and availability of treatment for abuse.   Diet: Encouraged to adjust caloric intake to maintain  or achieve ideal body weight, to reduce intake of dietary saturated fat and total fat, to limit sodium intake by avoiding high sodium foods and not adding table salt, and to maintain adequate dietary potassium and calcium preferably from fresh fruits, vegetables, and low-fat dairy products.    stressed the importance of regular exercise  Injury prevention: Discussed safety belts, safety helmets, smoke detector, smoking near bedding or upholstery.   Dental health: Discussed importance of regular tooth brushing, flossing, and dental visits.   Follow up  plan: NEXT PREVENTATIVE PHYSICAL DUE IN 1 YEAR. Return early october.

## 2023-05-23 LAB — COMPREHENSIVE METABOLIC PANEL
ALT: 17 IU/L (ref 0–44)
AST: 19 IU/L (ref 0–40)
Albumin: 4.6 g/dL (ref 3.8–4.9)
Alkaline Phosphatase: 59 IU/L (ref 44–121)
BUN/Creatinine Ratio: 13 (ref 9–20)
BUN: 13 mg/dL (ref 6–24)
Bilirubin Total: 0.4 mg/dL (ref 0.0–1.2)
CO2: 22 mmol/L (ref 20–29)
Calcium: 9.8 mg/dL (ref 8.7–10.2)
Chloride: 101 mmol/L (ref 96–106)
Creatinine, Ser: 1.03 mg/dL (ref 0.76–1.27)
Globulin, Total: 2.6 g/dL (ref 1.5–4.5)
Glucose: 135 mg/dL — ABNORMAL HIGH (ref 70–99)
Potassium: 3.9 mmol/L (ref 3.5–5.2)
Sodium: 139 mmol/L (ref 134–144)
Total Protein: 7.2 g/dL (ref 6.0–8.5)
eGFR: 86 mL/min/{1.73_m2} (ref >59)

## 2023-05-23 LAB — CBC WITH DIFFERENTIAL/PLATELET
Basophils Absolute: 0 10*3/uL (ref 0.0–0.2)
Basos: 1 %
EOS (ABSOLUTE): 0.1 10*3/uL (ref 0.0–0.4)
Eos: 2 %
Hematocrit: 44.6 % (ref 37.5–51.0)
Hemoglobin: 14.9 g/dL (ref 13.0–17.7)
Immature Grans (Abs): 0 10*3/uL (ref 0.0–0.1)
Immature Granulocytes: 0 %
Lymphocytes Absolute: 1.8 10*3/uL (ref 0.7–3.1)
Lymphs: 41 %
MCH: 28.5 pg (ref 26.6–33.0)
MCHC: 33.4 g/dL (ref 31.5–35.7)
MCV: 85 fL (ref 79–97)
Monocytes Absolute: 0.5 10*3/uL (ref 0.1–0.9)
Monocytes: 12 %
Neutrophils Absolute: 1.9 10*3/uL (ref 1.4–7.0)
Neutrophils: 44 %
Platelets: 213 10*3/uL (ref 150–450)
RBC: 5.22 x10E6/uL (ref 4.14–5.80)
RDW: 13 % (ref 11.6–15.4)
WBC: 4.3 10*3/uL (ref 3.4–10.8)

## 2023-05-23 LAB — PSA: Prostate Specific Ag, Serum: 0.5 ng/mL (ref 0.0–4.0)

## 2023-05-23 LAB — LIPID PANEL W/O CHOL/HDL RATIO
Cholesterol, Total: 223 mg/dL — ABNORMAL HIGH (ref 100–199)
HDL: 41 mg/dL (ref >39)
LDL Chol Calc (NIH): 142 mg/dL — ABNORMAL HIGH (ref 0–99)
Triglycerides: 222 mg/dL — ABNORMAL HIGH (ref 0–149)
VLDL Cholesterol Cal: 40 mg/dL (ref 5–40)

## 2023-05-23 LAB — TSH: TSH: 0.706 u[IU]/mL (ref 0.450–4.500)

## 2023-05-25 ENCOUNTER — Encounter: Payer: Self-pay | Admitting: Family Medicine

## 2023-05-25 ENCOUNTER — Telehealth: Payer: Self-pay

## 2023-05-25 NOTE — Telephone Encounter (Signed)
Pt most recent eye requested.

## 2023-05-25 NOTE — Telephone Encounter (Signed)
Requested Prescriptions  Pending Prescriptions Disp Refills   metFORMIN (GLUCOPHAGE-XR) 500 MG 24 hr tablet [Pharmacy Med Name: METFORMIN ER 500MG  24HR TABS] 180 tablet 1    Sig: TAKE 2 TABLETS BY MOUTH TWICE DAILY     Endocrinology:  Diabetes - Biguanides Failed - 05/22/2023  6:30 PM      Failed - B12 Level in normal range and within 720 days    No results found for: "VITAMINB12"       Passed - Cr in normal range and within 360 days    Creatinine, Ser  Date Value Ref Range Status  05/22/2023 1.03 0.76 - 1.27 mg/dL Final         Passed - HBA1C is between 0 and 7.9 and within 180 days    HB A1C (BAYER DCA - WAIVED)  Date Value Ref Range Status  05/22/2023 7.1 (H) 4.8 - 5.6 % Final    Comment:             Prediabetes: 5.7 - 6.4          Diabetes: >6.4          Glycemic control for adults with diabetes: <7.0          Passed - eGFR in normal range and within 360 days    GFR calc Af Amer  Date Value Ref Range Status  06/29/2020 116 >59 mL/min/1.73 Final    Comment:    **[facility] currently reports eGFR in compliance with the current**   recommendations of the [employer]. [facility] will   update reporting as new guidelines are published from the NKF-ASN   Task force.    GFR calc non Af Amer  Date Value Ref Range Status  06/29/2020 100 >59 mL/min/1.73 Final   eGFR  Date Value Ref Range Status  05/22/2023 86 >59 mL/min/1.73 Final         Passed - Valid encounter within last 6 months    Recent Outpatient Visits           3 days ago Routine general medical examination at a health care facility   [facility], [place] P, DO   8 months ago Type 2 diabetes mellitus with hyperglycemia, without long-term current use of insulin (HCC)   [place] [facility] [place], Megan P, DO   11 months ago Eye pain, left   [place] [facility] Gabriel Cirri, NP   1 year ago Routine general medical examination at a  health care facility   [facility], [place] P, DO   2 years ago Type 2 diabetes mellitus with hyperglycemia, without long-term current use of insulin (HCC)    [facility] [place], Megan P, DO              Passed - CBC within normal limits and completed in the last 12 months    WBC  Date Value Ref Range Status  05/22/2023 4.3 3.4 - 10.8 x10E3/uL Final   RBC  Date Value Ref Range Status  05/22/2023 5.22 4.14 - 5.80 x10E6/uL Final   Hemoglobin  Date Value Ref Range Status  05/22/2023 14.9 13.0 - 17.7 g/dL Final   Hematocrit  Date Value Ref Range Status  05/22/2023 44.6 37.5 - 51.0 % Final   MCHC  Date Value Ref Range Status  05/22/2023 33.4 31.5 - 35.7 g/dL Final   [facility]  Date Value Ref Range Status  05/22/2023 28.5 26.6 -  33.0 pg Final   MCV  Date Value Ref Range Status  05/22/2023 85 79 - 97 fL Final   No results found for: "PLTCOUNTKUC", "LABPLAT", "POCPLA" RDW  Date Value Ref Range Status  05/22/2023 13.0 11.6 - 15.4 % Final

## 2023-05-25 NOTE — Telephone Encounter (Signed)
-----   Message from Dorcas Carrow, [place] sent at 05/22/2023  2:07 PM EDT ----- Eye exam with Dr. Clydene Pugh- can we get copy please

## 2023-06-18 ENCOUNTER — Other Ambulatory Visit: Payer: Self-pay | Admitting: Family Medicine

## 2023-06-18 NOTE — Telephone Encounter (Signed)
Requested medication (s) are due for refill today: yes   Requested medication (s) are on the active medication list: yes   Last refill:  05/07/23 #15 0 refills  Future visit scheduled: no   Notes to clinic:   last OV 3 weeks ago. Previous Rx courtesy refill. Please advise if new refill can be written?     Requested Prescriptions  Pending Prescriptions Disp Refills   lisinopril-hydrochlorothiazide (ZESTORETIC) 20-25 MG tablet [Pharmacy Med Name: LISINOPRIL-HCTZ 20/25MG  TABLETS] 30 tablet     Sig: Take 1 tablet by mouth daily.     Cardiovascular:  ACEI + Diuretic Combos Passed - 06/18/2023  3:24 AM      Passed - Na in normal range and within 180 days    Sodium  Date Value Ref Range Status  05/22/2023 139 134 - 144 mmol/L Final         Passed - K in normal range and within 180 days    Potassium  Date Value Ref Range Status  05/22/2023 3.9 3.5 - 5.2 mmol/L Final         Passed - Cr in normal range and within 180 days    Creatinine, Ser  Date Value Ref Range Status  05/22/2023 1.03 0.76 - 1.27 mg/dL Final         Passed - eGFR is 30 or above and within 180 days    GFR calc Af Amer  Date Value Ref Range Status  06/29/2020 116 >59 mL/min/1.73 Final    Comment:    **[facility] currently reports eGFR in compliance with the current**   recommendations of the [employer]. [facility] will   update reporting as new guidelines are published from the NKF-ASN   Task force.    GFR calc non Af Amer  Date Value Ref Range Status  06/29/2020 100 >59 mL/min/1.73 Final   eGFR  Date Value Ref Range Status  05/22/2023 86 >59 mL/min/1.73 Final         Passed - Patient is not pregnant      Passed - Last BP in normal range    BP Readings from Last 1 Encounters:  05/22/23 134/83         Passed - Valid encounter within last 6 months    Recent Outpatient Visits           3 weeks ago Routine general medical examination at a health care facility   [facility], Megan P, DO   9 months ago Type 2 diabetes mellitus with hyperglycemia, without long-term current use of insulin (HCC)   [place] [facility], Megan P, DO   1 year ago Eye pain, left   [place] [facility] Gabriel Cirri, NP   1 year ago Routine general medical examination at a health care facility   [facility], [place] P, DO   2 years ago Type 2 diabetes mellitus with hyperglycemia, without long-term current use of insulin [facility])   [place] [facility], [place], DO

## 2023-06-18 NOTE — Telephone Encounter (Signed)
Please get follow up scheduled for early October.

## 2023-06-20 ENCOUNTER — Other Ambulatory Visit: Payer: Self-pay | Admitting: Family Medicine

## 2023-06-22 ENCOUNTER — Telehealth: Payer: Self-pay | Admitting: Family Medicine

## 2023-06-22 NOTE — Telephone Encounter (Signed)
Requested Prescriptions  Pending Prescriptions Disp Refills   empagliflozin (JARDIANCE) 25 MG TABS tablet [Pharmacy Med Name: JARDIANCE 25MG  TABLETS] 90 tablet 0    Sig: TAKE 1 TABLET(25 MG) BY MOUTH DAILY     Endocrinology:  Diabetes - SGLT2 Inhibitors Passed - 06/20/2023  2:18 PM      Passed - Cr in normal range and within 360 days    Creatinine, Ser  Date Value Ref Range Status  05/22/2023 1.03 0.76 - 1.27 mg/dL Final         Passed - HBA1C is between 0 and 7.9 and within 180 days    HB A1C (BAYER DCA - WAIVED)  Date Value Ref Range Status  05/22/2023 7.1 (H) 4.8 - 5.6 % Final    Comment:             Prediabetes: 5.7 - 6.4          Diabetes: >6.4          Glycemic control for adults with diabetes: <7.0          Passed - eGFR in normal range and within 360 days    GFR calc Af Amer  Date Value Ref Range Status  06/29/2020 116 >59 mL/min/1.73 Final    Comment:    **[facility] currently reports eGFR in compliance with the current**   recommendations of the [employer]. [facility] will   update reporting as new guidelines are published from the NKF-ASN   Task force.    GFR calc non Af Amer  Date Value Ref Range Status  06/29/2020 100 >59 mL/min/1.73 Final   eGFR  Date Value Ref Range Status  05/22/2023 86 >59 mL/min/1.73 Final         Passed - Valid encounter within last 6 months    Recent Outpatient Visits           1 month ago Routine general medical examination at a health care facility   [facility], Megan P, DO   9 months ago Type 2 diabetes mellitus with hyperglycemia, without long-term current use of insulin (HCC)   [place] [facility] [place], Megan P, DO   1 year ago Eye pain, left   [place] [facility] Gabriel Cirri, NP   1 year ago Routine general medical examination at a health care facility   [facility], [place] P, DO   2 years ago Type 2  diabetes mellitus with hyperglycemia, without long-term current use of insulin [facility])   [place] [facility] [place], [place], DO

## 2023-06-22 NOTE — Telephone Encounter (Signed)
Attempted to reach patient, LVM to call office back to get scheduled for early October.  Put in CRM.

## 2023-06-22 NOTE — Telephone Encounter (Signed)
He should have enough to make it to his appointment in early October.

## 2023-06-22 NOTE — Telephone Encounter (Signed)
I'm unclear as to what this is about? Needs appointment or early October.

## 2023-06-22 NOTE — Telephone Encounter (Unsigned)
Copied from CRM [id number]. Topic: Appointment Scheduling - Scheduling Inquiry for Clinic >> Jun 22, 2023  1:44 PM [place] B wrote: Reason for CRM: pt says he has to look at this schedule and will cb to schedule the appt, but does he need to this on order to get his current refill or is this for the future after he gets the refill on the jaundiance?

## 2023-06-23 NOTE — Telephone Encounter (Signed)
Called LVM to call office to get scheduled for early October, informed him that provider stated he would have enough until his appointment in early October.

## 2023-08-27 ENCOUNTER — Other Ambulatory Visit: Payer: Self-pay | Admitting: Family Medicine

## 2023-08-27 NOTE — Telephone Encounter (Signed)
Requested Prescriptions  Pending Prescriptions Disp Refills   metFORMIN (GLUCOPHAGE-XR) 500 MG 24 hr tablet [Pharmacy Med Name: METFORMIN ER 500MG  24HR TABS] 360 tablet 0    Sig: TAKE 2 TABLETS BY MOUTH TWICE DAILY     Endocrinology:  Diabetes - Biguanides Failed - 08/27/2023  3:33 AM      Failed - B12 Level in normal range and within 720 days    No results found for: "VITAMINB12"       Passed - Cr in normal range and within 360 days    Creatinine, Ser  Date Value Ref Range Status  05/22/2023 1.03 0.76 - 1.27 mg/dL Final         Passed - HBA1C is between 0 and 7.9 and within 180 days    HB A1C (BAYER DCA - WAIVED)  Date Value Ref Range Status  05/22/2023 7.1 (H) 4.8 - 5.6 % Final    Comment:             Prediabetes: 5.7 - 6.4          Diabetes: >6.4          Glycemic control for adults with diabetes: <7.0          Passed - eGFR in normal range and within 360 days    GFR calc Af Amer  Date Value Ref Range Status  06/29/2020 116 >59 mL/min/1.73 Final    Comment:    **[facility] currently reports eGFR in compliance with the current**   recommendations of the [employer]. [facility] will   update reporting as new guidelines are published from the NKF-ASN   Task force.    GFR calc non Af Amer  Date Value Ref Range Status  06/29/2020 100 >59 mL/min/1.73 Final   eGFR  Date Value Ref Range Status  05/22/2023 86 >59 mL/min/1.73 Final         Passed - Valid encounter within last 6 months    Recent Outpatient Visits           3 months ago Routine general medical examination at a health care facility   [facility], Megan [facility], DO   11 months ago Type 2 diabetes mellitus with hyperglycemia, without long-term current use of insulin (HCC)   [facility] [place], Megan P, DO   1 year ago Eye pain, left   [place] [facility] Gabriel Cirri, NP   1 year ago Routine general medical examination at a  health care facility   [facility], [place] P, DO   2 years ago Type 2 diabetes mellitus with hyperglycemia, without long-term current use of insulin (HCC)   [facility] [place], Megan P, DO              Passed - CBC within normal limits and completed in the last 12 months    WBC  Date Value Ref Range Status  05/22/2023 4.3 3.4 - 10.8 x10E3/uL Final   RBC  Date Value Ref Range Status  05/22/2023 5.22 4.14 - 5.80 x10E6/uL Final   Hemoglobin  Date Value Ref Range Status  05/22/2023 14.9 13.0 - 17.7 g/dL Final   Hematocrit  Date Value Ref Range Status  05/22/2023 44.6 37.5 - 51.0 % Final   MCHC  Date Value Ref Range Status  05/22/2023 33.4 31.5 - 35.7 g/dL Final   [facility]  Date Value Ref Range Status  05/22/2023 28.5 26.6 -  33.0 pg Final   MCV  Date Value Ref Range Status  05/22/2023 85 79 - 97 fL Final   No results found for: "PLTCOUNTKUC", "LABPLAT", "POCPLA" RDW  Date Value Ref Range Status  05/22/2023 13.0 11.6 - 15.4 % Final

## 2023-08-28 ENCOUNTER — Ambulatory Visit: Payer: BC Managed Care – PPO | Admitting: Family Medicine

## 2023-09-18 ENCOUNTER — Other Ambulatory Visit: Payer: Self-pay | Admitting: Family Medicine

## 2023-09-18 NOTE — Telephone Encounter (Signed)
Requested Prescriptions  Pending Prescriptions Disp Refills   lisinopril-hydrochlorothiazide (ZESTORETIC) 20-25 MG tablet [Pharmacy Med Name: LISINOPRIL-HCTZ 20/25MG  TABLETS] 90 tablet 0    Sig: TAKE 1 TABLET BY MOUTH DAILY     Cardiovascular:  ACEI + Diuretic Combos Passed - 09/18/2023  3:25 AM      Passed - Na in normal range and within 180 days    Sodium  Date Value Ref Range Status  05/22/2023 139 134 - 144 mmol/L Final         Passed - K in normal range and within 180 days    Potassium  Date Value Ref Range Status  05/22/2023 3.9 3.5 - 5.2 mmol/L Final         Passed - Cr in normal range and within 180 days    Creatinine, Ser  Date Value Ref Range Status  05/22/2023 1.03 0.76 - 1.27 mg/dL Final         Passed - eGFR is 30 or above and within 180 days    GFR calc Af Amer  Date Value Ref Range Status  06/29/2020 116 >59 mL/min/1.73 Final    Comment:    **[facility] currently reports eGFR in compliance with the current**   recommendations of the [employer]. [facility] will   update reporting as new guidelines are published from the NKF-ASN   Task force.    GFR calc non Af Amer  Date Value Ref Range Status  06/29/2020 100 >59 mL/min/1.73 Final   eGFR  Date Value Ref Range Status  05/22/2023 86 >59 mL/min/1.73 Final         Passed - Patient is not pregnant      Passed - Last BP in normal range    BP Readings from Last 1 Encounters:  05/22/23 134/83         Passed - Valid encounter within last 6 months    Recent Outpatient Visits           3 months ago Routine general medical examination at a health care facility   [facility], [place] P, DO   1 year ago Type 2 diabetes mellitus with hyperglycemia, without long-term current use of insulin [facility])   [place] [facility] [place], Megan P, DO   1 year ago Eye pain, left   [place] [facility] Gabriel Cirri, NP   1 year ago Routine general  medical examination at a health care facility   [facility], [place] P, DO   2 years ago Type 2 diabetes mellitus with hyperglycemia, without long-term current use of insulin [facility])   [place] [facility] [place], [place], DO

## 2023-09-24 DIAGNOSIS — Z23 Encounter for immunization: Secondary | ICD-10-CM | POA: Diagnosis not present

## 2023-09-25 ENCOUNTER — Other Ambulatory Visit: Payer: Self-pay | Admitting: Family Medicine

## 2023-09-25 NOTE — Telephone Encounter (Signed)
Requested Prescriptions  Pending Prescriptions Disp Refills   JARDIANCE 25 MG TABS tablet [Pharmacy Med Name: JARDIANCE 25MG  TABLETS] 90 tablet 0    Sig: TAKE 1 TABLET(25 MG) BY MOUTH DAILY     Endocrinology:  Diabetes - SGLT2 Inhibitors Passed - 09/25/2023  7:43 AM      Passed - Cr in normal range and within 360 days    Creatinine, Ser  Date Value Ref Range Status  05/22/2023 1.03 0.76 - 1.27 mg/dL Final         Passed - HBA1C is between 0 and 7.9 and within 180 days    HB A1C (BAYER DCA - WAIVED)  Date Value Ref Range Status  05/22/2023 7.1 (H) 4.8 - 5.6 % Final    Comment:             Prediabetes: 5.7 - 6.4          Diabetes: >6.4          Glycemic control for adults with diabetes: <7.0          Passed - eGFR in normal range and within 360 days    GFR calc Af Amer  Date Value Ref Range Status  06/29/2020 116 >59 mL/min/1.73 Final    Comment:    **[facility] currently reports eGFR in compliance with the current**   recommendations of the [employer]. [facility] will   update reporting as new guidelines are published from the NKF-ASN   Task force.    GFR calc non Af Amer  Date Value Ref Range Status  06/29/2020 100 >59 mL/min/1.73 Final   eGFR  Date Value Ref Range Status  05/22/2023 86 >59 mL/min/1.73 Final         Passed - Valid encounter within last 6 months    Recent Outpatient Visits           4 months ago Routine general medical examination at a health care facility   [facility], [place] P, DO   1 year ago Type 2 diabetes mellitus with hyperglycemia, without long-term current use of insulin [facility])   [place] [facility] [place], Megan P, DO   1 year ago Eye pain, left   [place] [facility] Gabriel Cirri, NP   1 year ago Routine general medical examination at a health care facility   [facility], [place] P, DO   2 years ago Type 2 diabetes mellitus with  hyperglycemia, without long-term current use of insulin [facility])   [place] [facility] [place], [place], DO

## 2023-10-02 ENCOUNTER — Telehealth: Payer: Self-pay | Admitting: Family Medicine

## 2023-10-02 NOTE — Telephone Encounter (Signed)
Patient called sttd he went to the pharmacy and the  JARDIANCE 25 MG TABS tablet is costing him $50. He needs to see what provider can do. Please f/u with patient

## 2023-10-05 NOTE — Progress Notes (Signed)
   10/05/2023  Patient ID: Greg Wood, male   DOB: [birth year], 52 y.o.   MRN: [id number]  Clinic routed request from Dr. Henriette Combs office to assist with affordability of Jardiance 25mg  daily.  Attempted to obtain a copay card for the medication, but Mr. Manchego appears to already have one.  Contacted the filling [facility], and the prescription is going through on his insurance and the copay card for $10/month.  Attempted to call the patient to inform and verify that copay is affordable, but I was unable to reach.  I did leave a HIPAA compliant voicemail with my direct phone number.  Lenna Gilford, PharmD, DPLA

## 2023-10-24 ENCOUNTER — Other Ambulatory Visit: Payer: Self-pay | Admitting: Family Medicine

## 2023-10-27 NOTE — Telephone Encounter (Signed)
Requested Prescriptions  Pending Prescriptions Disp Refills   rosuvastatin (CRESTOR) 20 MG tablet [Pharmacy Med Name: ROSUVASTATIN 20MG  TABLETS] 30 tablet 0    Sig: TAKE 1 TABLET BY MOUTH ONCE A WEEK     Cardiovascular:  Antilipid - Statins 2 Failed - 10/24/2023  1:23 PM      Failed - Lipid Panel in normal range within the last 12 months    Cholesterol, Total  Date Value Ref Range Status  05/22/2023 223 (H) 100 - 199 mg/dL Final   Cholesterol [place]  Date Value Ref Range Status  06/03/2017 WILL FOLLOW  Preliminary   LDL Chol Calc (NIH)  Date Value Ref Range Status  05/22/2023 142 (H) 0 - 99 mg/dL Final   HDL  Date Value Ref Range Status  05/22/2023 41 >39 mg/dL Final   Triglycerides  Date Value Ref Range Status  05/22/2023 222 (H) 0 - 149 mg/dL Final   Triglycerides Piccolo,Waived  Date Value Ref Range Status  06/03/2017 WILL FOLLOW  Preliminary         Passed - Cr in normal range and within 360 days    Creatinine, Ser  Date Value Ref Range Status  05/22/2023 1.03 0.76 - 1.27 mg/dL Final         Passed - Patient is not pregnant      Passed - Valid encounter within last 12 months    Recent Outpatient Visits           5 months ago Routine general medical examination at a health care facility   [facility] [place], [place] P, DO   1 year ago Type 2 diabetes mellitus with hyperglycemia, without long-term current use of insulin (HCC)   [place] [facility] [place], Megan P, DO   1 year ago Eye pain, left   [place] [facility] Gabriel Cirri, NP   2 years ago Routine general medical examination at a health care facility   [facility], [place] P, DO   2 years ago Type 2 diabetes mellitus with hyperglycemia, without long-term current use of insulin [facility])   [place] [facility] [place], Oralia Rud, DO       Future Appointments             In 3 days Dorcas Carrow, DO  [facility], [facility]

## 2023-10-30 ENCOUNTER — Ambulatory Visit: Payer: BC Managed Care – PPO | Admitting: Family Medicine

## 2023-11-06 ENCOUNTER — Ambulatory Visit: Payer: BC Managed Care – PPO | Admitting: Pediatrics

## 2023-11-09 ENCOUNTER — Ambulatory Visit: Payer: BC Managed Care – PPO | Admitting: Pediatrics

## 2023-12-01 ENCOUNTER — Other Ambulatory Visit: Payer: Self-pay | Admitting: Family Medicine

## 2023-12-02 NOTE — Telephone Encounter (Signed)
 Requested medication (s) are due for refill today: yes  Requested medication (s) are on the active medication list: yes  Last refill:  08/27/23 #360 1 RF  Future visit scheduled: yes  Notes to clinic:  overdue lab work    Requested Prescriptions  Pending Prescriptions Disp Refills   metFORMIN  (GLUCOPHAGE -XR) 500 MG 24 hr tablet [Pharmacy Med Name: METFORMIN  ER 500MG  24HR TABS] 360 tablet 0    Sig: TAKE 2 TABLETS BY MOUTH TWICE DAILY     Endocrinology:  Diabetes - Biguanides Failed - 12/02/2023  4:27 PM      Failed - HBA1C is between 0 and 7.9 and within 180 days    HB A1C (BAYER DCA - WAIVED)  Date Value Ref Range Status  05/22/2023 7.1 (H) 4.8 - 5.6 % Final    Comment:             Prediabetes: 5.7 - 6.4          Diabetes: >6.4          Glycemic control for adults with diabetes: <7.0          Failed - B12 Level in normal range and within 720 days    No results found for: VITAMINB12       Failed - Valid encounter within last 6 months    Recent Outpatient Visits           6 months ago Routine general medical examination at a health care facility   [facility] [place], [place] P, DO   1 year ago Type 2 diabetes mellitus with hyperglycemia, without long-term current use of insulin (HCC)   [place] [facility] [place], Megan P, DO   1 year ago Eye pain, left   [place] [facility] Daphane Rosella, NP   2 years ago Routine general medical examination at a health care facility   [facility], [place] P, DO   2 years ago Type 2 diabetes mellitus with hyperglycemia, without long-term current use of insulin (HCC)   [place] [facility] [place], [place], DO       Future Appointments             In 2 days Vicci, Duwaine SQUIBB, DO [place] [facility], [facility]            Passed - Cr in normal range and within 360 days    Creatinine, Ser  Date Value Ref Range Status   05/22/2023 1.03 0.76 - 1.27 mg/dL Final         Passed - eGFR in normal range and within 360 days    GFR calc Af Amer  Date Value Ref Range Status  06/29/2020 116 >59 mL/min/1.73 Final    Comment:    **[facility] currently reports eGFR in compliance with the current**   recommendations of the [employer]. [facility] will   update reporting as new guidelines are published from the NKF-ASN   Task force.    GFR calc non Af Amer  Date Value Ref Range Status  06/29/2020 100 >59 mL/min/1.73 Final   eGFR  Date Value Ref Range Status  05/22/2023 86 >59 mL/min/1.73 Final         Passed - CBC within normal limits and completed in the last 12 months    WBC  Date Value Ref Range Status  05/22/2023 4.3 3.4 - 10.8 x10E3/uL Final   RBC  Date Value  Ref Range Status  05/22/2023 5.22 4.14 - 5.80 x10E6/uL Final   Hemoglobin  Date Value Ref Range Status  05/22/2023 14.9 13.0 - 17.7 g/dL Final   Hematocrit  Date Value Ref Range Status  05/22/2023 44.6 37.5 - 51.0 % Final   MCHC  Date Value Ref Range Status  05/22/2023 33.4 31.5 - 35.7 g/dL Final   [facility]  Date Value Ref Range Status  05/22/2023 28.5 26.6 - 33.0 pg Final   MCV  Date Value Ref Range Status  05/22/2023 85 79 - 97 fL Final   No results found for: PLTCOUNTKUC, LABPLAT, POCPLA RDW  Date Value Ref Range Status  05/22/2023 13.0 11.6 - 15.4 % Final

## 2023-12-04 ENCOUNTER — Ambulatory Visit: Payer: BC Managed Care – PPO | Admitting: Family Medicine

## 2023-12-06 ENCOUNTER — Other Ambulatory Visit: Payer: Self-pay | Admitting: Family Medicine

## 2023-12-08 NOTE — Telephone Encounter (Signed)
 Unable to refill per protocol, Rx requested refilled 12/06/23.  Requested Prescriptions  Pending Prescriptions Disp Refills   metFORMIN  (GLUCOPHAGE -XR) 500 MG 24 hr tablet [Pharmacy Med Name: METFORMIN  ER 500MG  24HR TABS] 360 tablet     Sig: TAKE 2 TABLETS BY MOUTH TWICE DAILY     Endocrinology:  Diabetes - Biguanides Failed - 12/08/2023 12:23 PM      Failed - HBA1C is between 0 and 7.9 and within 180 days    HB A1C (BAYER DCA - WAIVED)  Date Value Ref Range Status  05/22/2023 7.1 (H) 4.8 - 5.6 % Final    Comment:             Prediabetes: 5.7 - 6.4          Diabetes: >6.4          Glycemic control for adults with diabetes: <7.0          Failed - B12 Level in normal range and within 720 days    No results found for: VITAMINB12       Failed - Valid encounter within last 6 months    Recent Outpatient Visits           6 months ago Routine general medical examination at a health care facility   [facility] [place], [place] P, DO   1 year ago Type 2 diabetes mellitus with hyperglycemia, without long-term current use of insulin (HCC)   [place] [facility] [place], Megan P, DO   1 year ago Eye pain, left   [place] [facility] Daphane Rosella, NP   2 years ago Routine general medical examination at a health care facility   [facility], [place] P, DO   2 years ago Type 2 diabetes mellitus with hyperglycemia, without long-term current use of insulin (HCC)   [place] [facility] [place], [place], DO       Future Appointments             In 3 weeks Vicci, Duwaine SQUIBB, DO [place] [facility], [facility]            Passed - Cr in normal range and within 360 days    Creatinine, Ser  Date Value Ref Range Status  05/22/2023 1.03 0.76 - 1.27 mg/dL Final         Passed - eGFR in normal range and within 360 days    GFR calc Af Amer  Date Value Ref Range Status   06/29/2020 116 >59 mL/min/1.73 Final    Comment:    **[facility] currently reports eGFR in compliance with the current**   recommendations of the [employer]. [facility] will   update reporting as new guidelines are published from the NKF-ASN   Task force.    GFR calc non Af Amer  Date Value Ref Range Status  06/29/2020 100 >59 mL/min/1.73 Final   eGFR  Date Value Ref Range Status  05/22/2023 86 >59 mL/min/1.73 Final         Passed - CBC within normal limits and completed in the last 12 months    WBC  Date Value Ref Range Status  05/22/2023 4.3 3.4 - 10.8 x10E3/uL Final   RBC  Date Value Ref Range Status  05/22/2023 5.22 4.14 - 5.80 x10E6/uL Final   Hemoglobin  Date Value Ref Range Status  05/22/2023 14.9 13.0 - 17.7 g/dL Final   Hematocrit  Date Value Ref  Range Status  05/22/2023 44.6 37.5 - 51.0 % Final   MCHC  Date Value Ref Range Status  05/22/2023 33.4 31.5 - 35.7 g/dL Final   [facility]  Date Value Ref Range Status  05/22/2023 28.5 26.6 - 33.0 pg Final   MCV  Date Value Ref Range Status  05/22/2023 85 79 - 97 fL Final   No results found for: PLTCOUNTKUC, LABPLAT, POCPLA RDW  Date Value Ref Range Status  05/22/2023 13.0 11.6 - 15.4 % Final

## 2023-12-24 ENCOUNTER — Other Ambulatory Visit: Payer: Self-pay | Admitting: Family Medicine

## 2023-12-25 NOTE — Telephone Encounter (Signed)
Future OV scheduled 01/01/24.  Requested Prescriptions  Pending Prescriptions Disp Refills   lisinopril-hydrochlorothiazide (ZESTORETIC) 20-25 MG tablet [Pharmacy Med Name: LISINOPRIL-HCTZ 20/25MG  TABLETS] 90 tablet 0    Sig: TAKE 1 TABLET BY MOUTH DAILY     Cardiovascular:  ACEI + Diuretic Combos Failed - 12/25/2023  9:21 AM      Failed - Na in normal range and within 180 days    Sodium  Date Value Ref Range Status  05/22/2023 139 134 - 144 mmol/L Final         Failed - K in normal range and within 180 days    Potassium  Date Value Ref Range Status  05/22/2023 3.9 3.5 - 5.2 mmol/L Final         Failed - Cr in normal range and within 180 days    Creatinine, Ser  Date Value Ref Range Status  05/22/2023 1.03 0.76 - 1.27 mg/dL Final         Failed - eGFR is 30 or above and within 180 days    GFR calc Af Amer  Date Value Ref Range Status  06/29/2020 116 >59 mL/min/1.73 Final    Comment:    **[facility] currently reports eGFR in compliance with the current**   recommendations of the [employer]. [facility] will   update reporting as new guidelines are published from the NKF-ASN   Task force.    GFR calc non Af Amer  Date Value Ref Range Status  06/29/2020 100 >59 mL/min/1.73 Final   eGFR  Date Value Ref Range Status  05/22/2023 86 >59 mL/min/1.73 Final         Failed - Valid encounter within last 6 months    Recent Outpatient Visits           7 months ago Routine general medical examination at a health care facility   [facility], [place] P, DO   1 year ago Type 2 diabetes mellitus with hyperglycemia, without long-term current use of insulin (HCC)   [place] [facility] [place], Megan P, DO   1 year ago Eye pain, left   [place] [facility] Gabriel Cirri, NP   2 years ago Routine general medical examination at a health care facility   [facility], [place] P, DO    2 years ago Type 2 diabetes mellitus with hyperglycemia, without long-term current use of insulin [facility])   [place] [facility] [place], [place], DO       Future Appointments             In 1 week Dorcas Carrow, DO [place] [facility], [facility]            Passed - Patient is not pregnant      Passed - Last BP in normal range    BP Readings from Last 1 Encounters:  05/22/23 134/83

## 2024-01-01 ENCOUNTER — Encounter: Payer: Self-pay | Admitting: Family Medicine

## 2024-01-01 ENCOUNTER — Ambulatory Visit: Payer: BC Managed Care – PPO | Admitting: Family Medicine

## 2024-01-01 VITALS — BP 137/79 | HR 67 | Ht 72.0 in | Wt 222.8 lb

## 2024-01-01 DIAGNOSIS — E1165 Type 2 diabetes mellitus with hyperglycemia: Secondary | ICD-10-CM | POA: Diagnosis not present

## 2024-01-01 DIAGNOSIS — Z125 Encounter for screening for malignant neoplasm of prostate: Secondary | ICD-10-CM | POA: Diagnosis not present

## 2024-01-01 DIAGNOSIS — I129 Hypertensive chronic kidney disease with stage 1 through stage 4 chronic kidney disease, or unspecified chronic kidney disease: Secondary | ICD-10-CM

## 2024-01-01 DIAGNOSIS — E782 Mixed hyperlipidemia: Secondary | ICD-10-CM

## 2024-01-01 DIAGNOSIS — Z7984 Long term (current) use of oral hypoglycemic drugs: Secondary | ICD-10-CM

## 2024-01-01 DIAGNOSIS — Z1211 Encounter for screening for malignant neoplasm of colon: Secondary | ICD-10-CM

## 2024-01-01 LAB — MICROALBUMIN, URINE WAIVED
Creatinine, Urine Waived: 100 mg/dL (ref 10–300)
Microalb, Ur Waived: 30 mg/L — ABNORMAL HIGH (ref 0–19)
Microalb/Creat Ratio: <30 mg/g (ref <30)

## 2024-01-01 LAB — BAYER DCA HB A1C WAIVED: HB A1C (BAYER DCA - WAIVED): 6.5 % — ABNORMAL HIGH (ref 4.8–5.6)

## 2024-01-01 MED ORDER — ROSUVASTATIN CALCIUM 20 MG PO TABS: 20.0000 mg | ORAL_TABLET | ORAL | 0 refills | Status: AC

## 2024-01-01 MED ORDER — METFORMIN HCL ER 500 MG PO TB24: 1000.0000 mg | ORAL_TABLET | Freq: Two times a day (BID) | ORAL | 1 refills | Status: DC

## 2024-01-01 MED ORDER — EMPAGLIFLOZIN 25 MG PO TABS: 25.0000 mg | ORAL_TABLET | Freq: Every day | ORAL | 1 refills | Status: DC

## 2024-01-01 MED ORDER — LISINOPRIL-HYDROCHLOROTHIAZIDE 20-25 MG PO TABS: 1.0000 | ORAL_TABLET | Freq: Every day | ORAL | 1 refills | Status: DC

## 2024-01-01 NOTE — Assessment & Plan Note (Signed)
 Under good control on current regimen. Continue current regimen. Continue to monitor. Call with any concerns. Refills given. Labs drawn today.

## 2024-01-01 NOTE — Progress Notes (Signed)
 BP 137/79   Pulse 67   Ht 6' (1.829 m)   Wt 222 lb 12.8 oz (101.1 kg)   SpO2 97%   BMI 30.22 kg/m    Subjective:    Patient ID: Greg Wood, male    DOB: 11-Mar-1971, 53 y.o.   MRN: [id number]  HPI: Greg Wood is a 53 y.o. male  Chief Complaint  Patient presents with   Diabetes    Patient declines having a recent Diabetic Eye Exam at today's visit.    DIABETES Hypoglycemic episodes:no Polydipsia/polyuria: no Visual disturbance: no Chest pain: no Paresthesias: no Glucose Monitoring: no  Accucheck frequency: Not Checking Taking Insulin?: no Blood Pressure Monitoring: not checking Retinal Examination: Not up to Date Foot Exam: Up to Date Diabetic Education: Completed Pneumovax: Up to Date Influenza: Up to Date Aspirin: no  HYPERTENSION / HYPERLIPIDEMIA Satisfied with current treatment? yes Duration of hypertension: chronic BP monitoring frequency: not checking BP medication side effects: no Past BP meds: lisinopril , HCTZ Duration of hyperlipidemia: chronic Cholesterol medication side effects: no Cholesterol supplements: none Past cholesterol medications: crestor  Medication compliance: excellent compliance Aspirin: no Recent stressors: no Recurrent headaches: no Visual changes: no Palpitations: no Dyspnea: no Chest pain: no Lower extremity edema: no Dizzy/lightheaded: no  Relevant past medical, surgical, family and social history reviewed and updated as indicated. Interim medical history since our last visit reviewed. Allergies and medications reviewed and updated.  Review of Systems  Constitutional: Negative.   Respiratory: Negative.    Cardiovascular: Negative.   Musculoskeletal: Negative.   Neurological: Negative.   Psychiatric/Behavioral: Negative.      Per HPI unless specifically indicated above     Objective:    BP 137/79   Pulse 67   Ht 6' (1.829 m)   Wt 222 lb 12.8 oz (101.1 kg)   SpO2 97%   BMI 30.22 kg/m   Wt Readings from  Last 3 Encounters:  01/01/24 222 lb 12.8 oz (101.1 kg)  05/22/23 223 lb 3.2 oz (101.2 kg)  12/30/22 224 lb 10.4 oz (101.9 kg)    Physical Exam Vitals and nursing note reviewed.  Constitutional:      General: He is not in acute distress.    Appearance: Normal appearance. He is not ill-appearing, toxic-appearing or diaphoretic.  HENT:     Head: Normocephalic and atraumatic.     Right Ear: External ear normal.     Left Ear: External ear normal.     Nose: Nose normal.     Mouth/Throat:     Mouth: Mucous membranes are moist.     Pharynx: Oropharynx is clear.  Eyes:     General: No scleral icterus.       Right eye: No discharge.        Left eye: No discharge.     Extraocular Movements: Extraocular movements intact.     Conjunctiva/sclera: Conjunctivae normal.     Pupils: Pupils are equal, round, and reactive to light.  Cardiovascular:     Rate and Rhythm: Normal rate and regular rhythm.     Pulses: Normal pulses.     Heart sounds: Normal heart sounds. No murmur heard.    No friction rub. No gallop.  Pulmonary:     Effort: Pulmonary effort is normal. No respiratory distress.     Breath sounds: Normal breath sounds. No stridor. No wheezing, rhonchi or rales.  Chest:     Chest wall: No tenderness.  Musculoskeletal:        General:  Normal range of motion.     Cervical back: Normal range of motion and neck supple.  Skin:    General: Skin is warm and dry.     Capillary Refill: Capillary refill takes less than 2 seconds.     Coloration: Skin is not jaundiced or pale.     Findings: No bruising, erythema, lesion or rash.  Neurological:     General: No focal deficit present.     Mental Status: He is alert and oriented to person, place, and time. Mental status is at baseline.  Psychiatric:        Mood and Affect: Mood normal.        Behavior: Behavior normal.        Thought Content: Thought content normal.        Judgment: Judgment normal.     Results for orders placed or  performed in visit on 05/25/23  HM DIABETES EYE EXAM   Collection Time: 06/03/22 12:00 AM  Result Value Ref Range   HM Diabetic Eye Exam No Retinopathy No Retinopathy      Assessment & Plan:   Problem List Items Addressed This Visit       Endocrine   Diabetes mellitus with hyperglycemia (HCC) - Primary   Doing well with A1c of 6.5. Continue current regimen. Call with any concerns.       Relevant Medications   empagliflozin  (JARDIANCE ) 25 MG TABS tablet   lisinopril -hydrochlorothiazide  (ZESTORETIC ) 20-25 MG tablet   metFORMIN  (GLUCOPHAGE -XR) 500 MG 24 hr tablet   rosuvastatin  (CRESTOR ) 20 MG tablet   Other Relevant Orders   Comprehensive metabolic panel   CBC with Differential/Platelet   Microalbumin, Urine Waived   Bayer DCA Hb A1c Waived     Genitourinary   Benign hypertensive renal disease   Under good control on current regimen. Continue current regimen. Continue to monitor. Call with any concerns. Refills given. Labs drawn today.       Relevant Orders   Comprehensive metabolic panel   CBC with Differential/Platelet   TSH     Other   Hyperlipidemia   Under good control on current regimen. Continue current regimen. Continue to monitor. Call with any concerns. Refills given. Labs drawn today.        Relevant Medications   lisinopril -hydrochlorothiazide  (ZESTORETIC ) 20-25 MG tablet   rosuvastatin  (CRESTOR ) 20 MG tablet   Other Relevant Orders   Comprehensive metabolic panel   CBC with Differential/Platelet   Lipid Panel w/o Chol/HDL Ratio   Other Visit Diagnoses       Screening for prostate cancer       Labs drawn today. Await results.   Relevant Orders   Comprehensive metabolic panel   CBC with Differential/Platelet   PSA     Screening for colon cancer       Cologuard ordered. Encouraged him to do it.   Relevant Orders   Cologuard        Follow up plan: Return in about 5 months (around 06/13/2024) for physical.

## 2024-01-01 NOTE — Assessment & Plan Note (Signed)
Doing well with A1c of 6.5. Continue current regimen. Call with any concerns.  ?

## 2024-01-02 LAB — COMPREHENSIVE METABOLIC PANEL
ALT: 19 [IU]/L (ref 0–44)
AST: 20 [IU]/L (ref 0–40)
Albumin: 4.5 g/dL (ref 3.8–4.9)
Alkaline Phosphatase: 65 [IU]/L (ref 44–121)
BUN/Creatinine Ratio: 16 (ref 9–20)
BUN: 15 mg/dL (ref 6–24)
Bilirubin Total: 0.4 mg/dL (ref 0.0–1.2)
CO2: 23 mmol/L (ref 20–29)
Calcium: 9.6 mg/dL (ref 8.7–10.2)
Chloride: 102 mmol/L (ref 96–106)
Creatinine, Ser: 0.94 mg/dL (ref 0.76–1.27)
Globulin, Total: 2.5 g/dL (ref 1.5–4.5)
Glucose: 114 mg/dL — ABNORMAL HIGH (ref 70–99)
Potassium: 4.1 mmol/L (ref 3.5–5.2)
Sodium: 139 mmol/L (ref 134–144)
Total Protein: 7 g/dL (ref 6.0–8.5)
eGFR: 96 mL/min/{1.73_m2} (ref >59)

## 2024-01-02 LAB — CBC WITH DIFFERENTIAL/PLATELET
Basophils Absolute: 0 10*3/uL (ref 0.0–0.2)
Basos: 1 %
EOS (ABSOLUTE): 0.1 10*3/uL (ref 0.0–0.4)
Eos: 3 %
Hematocrit: 45.4 % (ref 37.5–51.0)
Hemoglobin: 14.6 g/dL (ref 13.0–17.7)
Immature Grans (Abs): 0 10*3/uL (ref 0.0–0.1)
Immature Granulocytes: 0 %
Lymphocytes Absolute: 1.7 10*3/uL (ref 0.7–3.1)
Lymphs: 40 %
MCH: 27.9 pg (ref 26.6–33.0)
MCHC: 32.2 g/dL (ref 31.5–35.7)
MCV: 87 fL (ref 79–97)
Monocytes Absolute: 0.6 10*3/uL (ref 0.1–0.9)
Monocytes: 13 %
Neutrophils Absolute: 1.8 10*3/uL (ref 1.4–7.0)
Neutrophils: 43 %
Platelets: 207 10*3/uL (ref 150–450)
RBC: 5.23 x10E6/uL (ref 4.14–5.80)
RDW: 13.3 % (ref 11.6–15.4)
WBC: 4.2 10*3/uL (ref 3.4–10.8)

## 2024-01-02 LAB — LIPID PANEL W/O CHOL/HDL RATIO
Cholesterol, Total: 181 mg/dL (ref 100–199)
HDL: 39 mg/dL — ABNORMAL LOW (ref >39)
LDL Chol Calc (NIH): 111 mg/dL — ABNORMAL HIGH (ref 0–99)
Triglycerides: 174 mg/dL — ABNORMAL HIGH (ref 0–149)
VLDL Cholesterol Cal: 31 mg/dL (ref 5–40)

## 2024-01-02 LAB — PSA: Prostate Specific Ag, Serum: 0.5 ng/mL (ref 0.0–4.0)

## 2024-01-02 LAB — TSH: TSH: 0.655 u[IU]/mL (ref 0.450–4.500)

## 2024-01-03 ENCOUNTER — Encounter: Payer: Self-pay | Admitting: Family Medicine

## 2024-01-18 ENCOUNTER — Emergency Department
Admission: EM | Admit: 2024-01-18 | Discharge: 2024-01-18 | Disposition: A | Discharge status: Home / Self Care | Payer: BC Managed Care – PPO | Attending: Student in an Organized Health Care Education/Training Program | Admitting: Student in an Organized Health Care Education/Training Program

## 2024-01-18 ENCOUNTER — Other Ambulatory Visit: Payer: Self-pay

## 2024-01-18 DIAGNOSIS — K0889 Other specified disorders of teeth and supporting structures: Secondary | ICD-10-CM | POA: Diagnosis not present

## 2024-01-18 MED ORDER — OXYCODONE-ACETAMINOPHEN 5-325 MG PO TABS: 1.0000 | ORAL_TABLET | Freq: Three times a day (TID) | ORAL | 0 refills | Status: DC | PRN

## 2024-01-18 MED ORDER — AMOXICILLIN 500 MG PO CAPS: 500.0000 mg | ORAL_CAPSULE | Freq: Three times a day (TID) | ORAL | 0 refills | Status: AC

## 2024-01-18 NOTE — ED Triage Notes (Signed)
 Pt states dental on R side of mouth that started last Thursday, pt denies fevers. Slight swelling noted.

## 2024-01-18 NOTE — ED Notes (Signed)
 See triage note  Presents with possible dental abscess   Having pain to right upper gumline  Afebrile on arrival

## 2024-01-18 NOTE — ED Provider Notes (Signed)
 [facility] Provider Note    Event Date/Time   First MD Initiated Contact with Patient 01/18/24 [phone]     (approximate)   History   Dental Pain   HPI  Greg Wood is a 53 y.o. male who presents to the ER for of dental pain that started last night.  Does have a history of carious dentition having had to have teeth removed in the past.  Called his dentist but is not able to get in for a month.  States that he had trouble sleeping last night due to pain.     Physical Exam   Triage Vital Signs: ED Triage Vitals  Encounter Vitals Group     BP 01/18/24 0731 (!) 145/93     Systolic BP Percentile --      Diastolic BP Percentile --      Pulse Rate 01/18/24 0731 82     Resp 01/18/24 0731 18     Temp 01/18/24 0731 98.3 F (36.8 C)     Temp Source 01/18/24 0731 Oral     SpO2 01/18/24 0731 100 %     Weight 01/18/24 0734 222 lb 10.6 oz (101 kg)     Height 01/18/24 0734 6' (1.829 m)     Head Circumference --      Peak Flow --      Pain Score 01/18/24 0734 8     Pain Loc --      Pain Education --      Exclude from Growth Chart --     Most recent vital signs: Vitals:   01/18/24 0731  BP: (!) 145/93  Pulse: 82  Resp: 18  Temp: 98.3 F (36.8 C)  SpO2: 100%     Constitutional: Alert  Eyes: Conjunctivae are normal.  Head: Atraumatic. Nose: No congestion/rhinnorhea. Mouth/Throat: Mucous membranes are moist.  Carious dentition.  No trismus.  No blistering noted.  There is no evidence of drainable abscess noted.  Location of pain primarily in the right upper molars Neck: Painless ROM.  Cardiovascular:   Good peripheral circulation. Respiratory: Normal respiratory effort.  No retractions.  Gastrointestinal: Soft and nontender.  Musculoskeletal:  no deformity Neurologic:  MAE spontaneously. No gross focal neurologic deficits are appreciated.  Skin:  Skin is warm, dry and intact. No rash noted. Psychiatric: Mood and affect are normal. Speech and  behavior are normal.    ED Results / Procedures / Treatments   Labs (all labs ordered are listed, but only abnormal results are displayed) Labs Reviewed - No data to display   EKG     RADIOLOGY    PROCEDURES:  Critical Care performed: No  Procedures   MEDICATIONS ORDERED IN ED: Medications - No data to display   IMPRESSION / MDM / ASSESSMENT AND PLAN / ED COURSE  I reviewed the triage vital signs and the nursing notes.                              Differential diagnosis includes, but is not limited to, dental caries, periapical abscess, Ludwig's angina, cellulitis, parotitis  Patient presented to the ER for evaluation of right upper dental pain.  Clinically nontoxic-appearing no fluctuance or abscess amenable to drainage at this time.  Will give short course of pain medication discussed conservative management as well as prescription for antibiotics and follow-up with dentistry.       FINAL CLINICAL IMPRESSION(S) / ED DIAGNOSES  Final diagnoses:  Pain, dental     Rx / DC Orders   ED Discharge Orders          Ordered    amoxicillin (AMOXIL) 500 MG capsule  3 times daily        01/18/24 0809    oxyCODONE-acetaminophen (PERCOCET) 5-325 MG tablet  Every 8 hours PRN        01/18/24 0809             Note:  This document was prepared using Dragon voice recognition software and may include unintentional dictation errors.    Willy Eddy, MD 01/18/24 [phone]

## 2024-01-20 ENCOUNTER — Telehealth: Payer: Self-pay | Admitting: Physician Assistant

## 2024-01-20 MED ORDER — IBUPROFEN 800 MG PO TABS: 800.0000 mg | ORAL_TABLET | Freq: Three times a day (TID) | ORAL | 0 refills | Status: AC | PRN

## 2024-01-20 NOTE — Telephone Encounter (Cosign Needed)
 Patient called to request a prescription for high-dose ibuprofen.  He failed to notify the provider that he has a sensitivity to oxycodone.

## 2024-01-24 ENCOUNTER — Other Ambulatory Visit: Payer: Self-pay | Admitting: Family Medicine

## 2024-01-26 ENCOUNTER — Other Ambulatory Visit: Payer: Self-pay | Admitting: Family Medicine

## 2024-01-26 NOTE — Telephone Encounter (Signed)
 Refused Jardiance because this rx already been sent to [facility] [id number].  This request from [facility] [id number].

## 2024-01-27 NOTE — Telephone Encounter (Signed)
 Refilled 01/01/24. Requested Prescriptions  Refused Prescriptions Disp Refills   JARDIANCE 25 MG TABS tablet [Pharmacy Med Name: JARDIANCE 25MG  TABLETS] 90 tablet 1    Sig: TAKE 1 TABLET(25 MG) BY MOUTH DAILY     Endocrinology:  Diabetes - SGLT2 Inhibitors Failed - 01/27/2024  1:48 PM      Failed - Valid encounter within last 6 months    Recent Outpatient Visits           8 months ago Routine general medical examination at a health care facility   [facility] [place], [place] P, DO   1 year ago Type 2 diabetes mellitus with hyperglycemia, without long-term current use of insulin (HCC)   [facility] [place], Megan P, DO   1 year ago Eye pain, left   [facility] Gabriel Cirri, NP   2 years ago Routine general medical examination at a health care facility   [facility], [place] P, DO   2 years ago Type 2 diabetes mellitus with hyperglycemia, without long-term current use of insulin (HCC)   [facility] [place], [place], DO       Future Appointments             In 4 months Laural Benes, Oralia Rud, DO [facility]            Passed - Cr in normal range and within 360 days    Creatinine, Ser  Date Value Ref Range Status  01/01/2024 0.94 0.76 - 1.27 mg/dL Final         Passed - HBA1C is between 0 and 7.9 and within 180 days    HB A1C (BAYER DCA - WAIVED)  Date Value Ref Range Status  01/01/2024 6.5 (H) 4.8 - 5.6 % Final    Comment:             Prediabetes: 5.7 - 6.4          Diabetes: >6.4          Glycemic control for adults with diabetes: <7.0          Passed - eGFR in normal range and within 360 days    GFR calc Af Amer  Date Value Ref Range Status  06/29/2020 116 >59 mL/min/1.73 Final    Comment:    **[facility] currently reports eGFR in compliance with the current**   recommendations of the [employer].  [facility] will   update reporting as new guidelines are published from the NKF-ASN   Task force.    GFR calc non Af Amer  Date Value Ref Range Status  06/29/2020 100 >59 mL/min/1.73 Final   eGFR  Date Value Ref Range Status  01/01/2024 96 >59 mL/min/1.73 Final

## 2024-04-01 ENCOUNTER — Other Ambulatory Visit: Payer: Self-pay | Admitting: Family Medicine

## 2024-04-04 NOTE — Telephone Encounter (Signed)
 Requested Prescriptions  Refused Prescriptions Disp Refills   lisinopril -hydrochlorothiazide  (ZESTORETIC ) 20-25 MG tablet [Pharmacy Med Name: LISINOPRIL -HCTZ 20/25MG  TABLETS] 90 tablet 1    Sig: TAKE 1 TABLET BY MOUTH DAILY     Cardiovascular:  ACEI + Diuretic Combos Failed - 04/04/2024 12:18 PM      Failed - Last BP in normal range    BP Readings from Last 1 Encounters:  01/18/24 (!) 145/93         Passed - Na in normal range and within 180 days    Sodium  Date Value Ref Range Status  01/01/2024 139 134 - 144 mmol/L Final         Passed - K in normal range and within 180 days    Potassium  Date Value Ref Range Status  01/01/2024 4.1 3.5 - 5.2 mmol/L Final         Passed - Cr in normal range and within 180 days    Creatinine, Ser  Date Value Ref Range Status  01/01/2024 0.94 0.76 - 1.27 mg/dL Final         Passed - eGFR is 30 or above and within 180 days    GFR calc Af Amer  Date Value Ref Range Status  06/29/2020 116 >59 mL/min/1.73 Final    Comment:    **[facility] currently reports eGFR in compliance with the current**   recommendations of the [employer]. [facility] will   update reporting as new guidelines are published from the NKF-ASN   Task force.    GFR calc non Af Amer  Date Value Ref Range Status  06/29/2020 100 >59 mL/min/1.73 Final   eGFR  Date Value Ref Range Status  01/01/2024 96 >59 mL/min/1.73 Final         Passed - Patient is not pregnant      Passed - Valid encounter within last 6 months    Recent Outpatient Visits           3 months ago Type 2 diabetes mellitus with hyperglycemia, without long-term current use of insulin (HCC)   [facility] [place], Jerilee Montane, DO       Future Appointments             In 2 months Lincoln Renshaw, Jerilee Montane, DO [facility]

## 2024-06-03 ENCOUNTER — Encounter: Payer: BC Managed Care – PPO | Admitting: Family Medicine

## 2024-06-17 ENCOUNTER — Other Ambulatory Visit: Payer: Self-pay | Admitting: Family Medicine

## 2024-06-20 NOTE — Telephone Encounter (Signed)
 Requested Prescriptions  Pending Prescriptions Disp Refills   metFORMIN  (GLUCOPHAGE -XR) 500 MG 24 hr tablet [Pharmacy Med Name: METFORMIN  ER 500MG  24HR TABS] 360 tablet 0    Sig: TAKE 2 TABLETS(1000 MG) BY MOUTH TWICE DAILY     Endocrinology:  Diabetes - Biguanides Failed - 06/20/2024  8:39 AM      Failed - B12 Level in normal range and within 720 days    No results found for: VITAMINB12       Passed - Cr in normal range and within 360 days    Creatinine, Ser  Date Value Ref Range Status  01/01/2024 0.94 0.76 - 1.27 mg/dL Final         Passed - HBA1C is between 0 and 7.9 and within 180 days    HB A1C (BAYER DCA - WAIVED)  Date Value Ref Range Status  01/01/2024 6.5 (H) 4.8 - 5.6 % Final    Comment:             Prediabetes: 5.7 - 6.4          Diabetes: >6.4          Glycemic control for adults with diabetes: <7.0          Passed - eGFR in normal range and within 360 days    GFR calc Af Amer  Date Value Ref Range Status  06/29/2020 116 >59 mL/min/1.73 Final    Comment:    **[facility] currently reports eGFR in compliance with the current**   recommendations of the [employer]. [facility] will   update reporting as new guidelines are published from the NKF-ASN   Task force.    GFR calc non Af Amer  Date Value Ref Range Status  06/29/2020 100 >59 mL/min/1.73 Final   eGFR  Date Value Ref Range Status  01/01/2024 96 >59 mL/min/1.73 Final         Passed - Valid encounter within last 6 months    Recent Outpatient Visits           5 months ago Type 2 diabetes mellitus with hyperglycemia, without long-term current use of insulin (HCC)   [facility] [address] [place], [place] P, DO              Passed - CBC within normal limits and completed in the last 12 months    WBC  Date Value Ref Range Status  01/01/2024 4.2 3.4 - 10.8 x10E3/uL Final   RBC  Date Value Ref Range Status  01/01/2024 5.23 4.14 - 5.80 x10E6/uL Final   Hemoglobin  Date  Value Ref Range Status  01/01/2024 14.6 13.0 - 17.7 g/dL Final   Hematocrit  Date Value Ref Range Status  01/01/2024 45.4 37.5 - 51.0 % Final   MCHC  Date Value Ref Range Status  01/01/2024 32.2 31.5 - 35.7 g/dL Final   [facility]  Date Value Ref Range Status  01/01/2024 27.9 26.6 - 33.0 pg Final   MCV  Date Value Ref Range Status  01/01/2024 87 79 - 97 fL Final   No results found for: PLTCOUNTKUC, LABPLAT, POCPLA RDW  Date Value Ref Range Status  01/01/2024 13.3 11.6 - 15.4 % Final

## 2024-06-21 ENCOUNTER — Encounter: Admitting: Family Medicine

## 2024-08-24 ENCOUNTER — Other Ambulatory Visit: Payer: Self-pay | Admitting: Family Medicine

## 2024-08-25 NOTE — Telephone Encounter (Signed)
 Requested medications are due for refill today.  yes  Requested medications are on the active medications list.  yes  Last refill. 01/01/2024 #90 1 rf  Future visit scheduled.   yes  Notes to clinic.  Labs are expired.    Requested Prescriptions  Pending Prescriptions Disp Refills   JARDIANCE  25 MG TABS tablet [Pharmacy Med Name: JARDIANCE  25MG  TABLETS] 90 tablet 1    Sig: TAKE 1 TABLET(25 MG) BY MOUTH DAILY     Endocrinology:  Diabetes - SGLT2 Inhibitors Failed - 08/25/2024  3:16 PM      Failed - HBA1C is between 0 and 7.9 and within 180 days    HB A1C (BAYER DCA - WAIVED)  Date Value Ref Range Status  01/01/2024 6.5 (H) 4.8 - 5.6 % Final    Comment:             Prediabetes: 5.7 - 6.4          Diabetes: >6.4          Glycemic control for adults with diabetes: <7.0          Failed - Valid encounter within last 6 months    Recent Outpatient Visits           7 months ago Type 2 diabetes mellitus with hyperglycemia, without long-term current use of insulin (HCC)   [facility] [place], Megan P, DO              Passed - Cr in normal range and within 360 days    Creatinine, Ser  Date Value Ref Range Status  01/01/2024 0.94 0.76 - 1.27 mg/dL Final         Passed - eGFR in normal range and within 360 days    GFR calc Af Amer  Date Value Ref Range Status  06/29/2020 116 >59 mL/min/1.73 Final    Comment:    **[facility] currently reports eGFR in compliance with the current**   recommendations of the [employer]. [facility] will   update reporting as new guidelines are published from the NKF-ASN   Task force.    GFR calc non Af Amer  Date Value Ref Range Status  06/29/2020 100 >59 mL/min/1.73 Final   eGFR  Date Value Ref Range Status  01/01/2024 96 >59 mL/min/1.73 Final

## 2024-09-02 ENCOUNTER — Encounter: Payer: Self-pay | Admitting: Family Medicine

## 2024-09-02 ENCOUNTER — Ambulatory Visit: Admitting: Family Medicine

## 2024-09-02 VITALS — BP 148/90 | HR 89 | Temp 97.9°F | Resp 15 | Ht 72.01 in | Wt 222.0 lb

## 2024-09-02 DIAGNOSIS — I129 Hypertensive chronic kidney disease with stage 1 through stage 4 chronic kidney disease, or unspecified chronic kidney disease: Secondary | ICD-10-CM | POA: Diagnosis not present

## 2024-09-02 DIAGNOSIS — E1165 Type 2 diabetes mellitus with hyperglycemia: Secondary | ICD-10-CM | POA: Diagnosis not present

## 2024-09-02 DIAGNOSIS — Z23 Encounter for immunization: Secondary | ICD-10-CM | POA: Diagnosis not present

## 2024-09-02 DIAGNOSIS — E782 Mixed hyperlipidemia: Secondary | ICD-10-CM | POA: Diagnosis not present

## 2024-09-02 DIAGNOSIS — Z0000 Encounter for general adult medical examination without abnormal findings: Secondary | ICD-10-CM

## 2024-09-02 DIAGNOSIS — Z1211 Encounter for screening for malignant neoplasm of colon: Secondary | ICD-10-CM

## 2024-09-02 DIAGNOSIS — Z7984 Long term (current) use of oral hypoglycemic drugs: Secondary | ICD-10-CM

## 2024-09-02 LAB — MICROALBUMIN, URINE WAIVED
Creatinine, Urine Waived: 100 mg/dL (ref 10–300)
Microalb, Ur Waived: 30 mg/L — ABNORMAL HIGH (ref 0–19)
Microalb/Creat Ratio: <30 mg/g (ref <30)

## 2024-09-02 LAB — BAYER DCA HB A1C WAIVED: HB A1C (BAYER DCA - WAIVED): 7.3 % — ABNORMAL HIGH (ref 4.8–5.6)

## 2024-09-02 MED ORDER — EMPAGLIFLOZIN 25 MG PO TABS: 25.0000 mg | ORAL_TABLET | Freq: Every day | ORAL | 1 refills | Status: DC

## 2024-09-02 MED ORDER — LISINOPRIL-HYDROCHLOROTHIAZIDE 20-25 MG PO TABS: 1.0000 | ORAL_TABLET | Freq: Every day | ORAL | 1 refills | Status: AC

## 2024-09-02 MED ORDER — METFORMIN HCL ER 500 MG PO TB24: 1000.0000 mg | ORAL_TABLET | Freq: Two times a day (BID) | ORAL | 1 refills | Status: AC

## 2024-09-02 NOTE — Assessment & Plan Note (Signed)
 Up to 7.3. Will really work on diet and exercise and recheck in 3-6 months- if going high will adjust medicine, but he's currently not consistently taking his medicine, so encouraged him to take it consistently.

## 2024-09-02 NOTE — Assessment & Plan Note (Signed)
 Under good control on current regimen. Continue current regimen. Continue to monitor. Call with any concerns. Refills given. Labs drawn today.

## 2024-09-02 NOTE — Progress Notes (Signed)
 BP (!) 148/90   Pulse 89   Temp 97.9 F (36.6 C) (Oral)   Resp 15   Ht 6' 0.01 (1.829 m)   Wt 222 lb (100.7 kg)   SpO2 98%   BMI 30.10 kg/m    Subjective:    Patient ID: Greg Wood, male    DOB: 1970-12-04, 53 y.o.   MRN: [id number]  HPI: Greg Wood is a 53 y.o. male presenting on 09/02/2024 for comprehensive medical examination. Current medical complaints include:  DIABETES- has not been consistently taking his medicine.  Hypoglycemic episodes:no Polydipsia/polyuria: no Visual disturbance: no Chest pain: no Paresthesias: no Glucose Monitoring: no  Accucheck frequency: Not Checking Taking Insulin?: no Blood Pressure Monitoring: not checking Retinal Examination: Not up to Date Foot Exam: Up to Date Diabetic Education: Completed Pneumovax: Not up to Date Influenza: Up to Date Aspirin: no  HYPERTENSION / HYPERLIPIDEMIA Satisfied with current treatment? yes Duration of hypertension: chronic BP monitoring frequency: not checking BP medication side effects: no Past BP meds: lisinopril -HCTZ Duration of hyperlipidemia: chronic Cholesterol medication side effects: no Cholesterol supplements: none Past cholesterol medications: crestor  Medication compliance: excellent compliance Aspirin: no Recent stressors: no Recurrent headaches: no Visual changes: no Palpitations: no Dyspnea: no Chest pain: no Lower extremity edema: no Dizzy/lightheaded: no  He currently lives with: wife Interim Problems from his last visit: no  Depression Screen done today and results listed below:     09/02/2024   10:05 AM 05/22/2023    1:40 PM 09/12/2022   10:10 AM 06/03/2022   10:46 AM 10/11/2021   11:28 AM  Depression screen PHQ 2/9  Decreased Interest 0 0 0 0 0  Down, Depressed, Hopeless 0 0 0 0 0  PHQ - 2 Score 0 0 0 0 0  Altered sleeping 0 0 0 0 0  Tired, decreased energy 0 0 0 0 0  Change in appetite 0 0 0 0 0  Feeling bad or failure about yourself  0 0 0 0 0  Trouble  concentrating 0 0 0 0 0  Moving slowly or fidgety/restless 0 0 0 0 0  Suicidal thoughts 0 0 0 0 0  PHQ-9 Score 0 0 0 0 0  Difficult doing work/chores  Not difficult at all Not difficult at all Not difficult at all     Past Medical History:  Past Medical History:  Diagnosis Date   Acute bronchitis    Acute lateral meniscus tear of right knee    Acute medial meniscus tear of right knee    Allergic rhinitis due to pollen    Blood in the urine    Diabetes mellitus without complication (HCC)    Hypercalcemia    Resolved on recheck   Hyperlipidemia    Hypertension    Right medial tibial plateau fracture, closed, initial encounter     Surgical History:  Past Surgical History:  Procedure Laterality Date   CHONDROPLASTY Right 08/04/2018   Procedure: CHONDROPLASTY;  Surgeon: Jane Charleston, MD;  Location: [facility];  Service: Orthopedics;  Laterality: Right;   KNEE ARTHROSCOPY WITH LATERAL MENISECTOMY Right 08/04/2018   Procedure: KNEE ARTHROSCOPY WITH LATERAL MENISECTOMY;  Surgeon: Jane Charleston, MD;  Location: [facility];  Service: Orthopedics;  Laterality: Right;   KNEE ARTHROSCOPY WITH MEDIAL MENISECTOMY Right 08/04/2018   Procedure: KNEE ARTHROSCOPY WITH MEDIAL MENISECTOMY;  Surgeon: Jane Charleston, MD;  Location: [facility];  Service: Orthopedics;  Laterality: Right;   KNEE ARTHROSCOPY WITH SUBCHONDROPLASTY  Right 08/04/2018   Procedure: CHONDROPLASTY AND SUBCHONDROPLASTY MEDIAL TIBIAL PLATEAU;  Surgeon: Jane Charleston, MD;  Location: [facility];  Service: Orthopedics;  Laterality: Right;    Medications:  Current Outpatient Medications on File Prior to Visit  Medication Sig   Blood Glucose Monitoring Suppl (CONTOUR NEXT MONITOR) w/Device KIT USE PER PACKAGE INSTRUCTIONS   glucose blood test strip 1 each by Other route as needed for other. Use as instructed   ibuprofen  (ADVIL ) 800 MG tablet Take 1 tablet (800 mg total) by  mouth every 8 (eight) hours as needed.   rosuvastatin  (CRESTOR ) 20 MG tablet Take 1 tablet (20 mg total) by mouth once a week.   No current facility-administered medications on file prior to visit.    Allergies:  No Known Allergies  Social History:  Social History   Socioeconomic History   Marital status: Single    Spouse name: Not on file   Number of children: Not on file   Years of education: Not on file   Highest education level: Not on file  Occupational History   Not on file  Tobacco Use   Smoking status: Never   Smokeless tobacco: Never  Vaping Use   Vaping status: Never Used  Substance and Sexual Activity   Alcohol use: No   Drug use: No   Sexual activity: Yes    Birth control/protection: None  Other Topics Concern   Not on file  Social History Narrative   Not on file   Social Drivers of Health   Financial Resource Strain: Not on file  Food Insecurity: Not on file  Transportation Needs: Not on file  Physical Activity: Not on file  Stress: Not on file  Social Connections: Not on file  Intimate Partner Violence: Not on file   Social History   Tobacco Use  Smoking Status Never  Smokeless Tobacco Never   Social History   Substance and Sexual Activity  Alcohol Use No    Family History:  Family History  Problem Relation Age of Onset   Diabetes Mother    Diabetes Maternal Grandmother     Past medical history, surgical history, medications, allergies, family history and social history reviewed with patient today and changes made to appropriate areas of the chart.   Review of Systems  Constitutional: Negative.   HENT: Negative.    Eyes: Negative.   Respiratory: Negative.    Cardiovascular: Negative.   Gastrointestinal: Negative.   Genitourinary: Negative.   Musculoskeletal: Negative.   Skin: Negative.   Neurological: Negative.   Endo/Heme/Allergies: Negative.   Psychiatric/Behavioral: Negative.     All other ROS negative except what is  listed above and in the HPI.      Objective:    BP (!) 148/90   Pulse 89   Temp 97.9 F (36.6 C) (Oral)   Resp 15   Ht 6' 0.01 (1.829 m)   Wt 222 lb (100.7 kg)   SpO2 98%   BMI 30.10 kg/m   Wt Readings from Last 3 Encounters:  09/02/24 222 lb (100.7 kg)  01/18/24 222 lb 10.6 oz (101 kg)  01/01/24 222 lb 12.8 oz (101.1 kg)    Physical Exam Vitals and nursing note reviewed.  Constitutional:      General: He is not in acute distress.    Appearance: Normal appearance. He is obese. He is not ill-appearing, toxic-appearing or diaphoretic.  HENT:     Head: Normocephalic and atraumatic.     Right Ear: Tympanic  membrane, ear canal and external ear normal. There is no impacted cerumen.     Left Ear: Tympanic membrane, ear canal and external ear normal. There is no impacted cerumen.     Nose: Nose normal. No congestion or rhinorrhea.     Mouth/Throat:     Mouth: Mucous membranes are moist.     Pharynx: Oropharynx is clear. No oropharyngeal exudate or posterior oropharyngeal erythema.  Eyes:     General: No scleral icterus.       Right eye: No discharge.        Left eye: No discharge.     Extraocular Movements: Extraocular movements intact.     Conjunctiva/sclera: Conjunctivae normal.     Pupils: Pupils are equal, round, and reactive to light.  Neck:     Vascular: No carotid bruit.  Cardiovascular:     Rate and Rhythm: Normal rate and regular rhythm.     Pulses: Normal pulses.     Heart sounds: No murmur heard.    No friction rub. No gallop.  Pulmonary:     Effort: Pulmonary effort is normal. No respiratory distress.     Breath sounds: Normal breath sounds. No stridor. No wheezing, rhonchi or rales.  Chest:     Chest wall: No tenderness.  Abdominal:     General: Abdomen is flat. Bowel sounds are normal. There is no distension.     Palpations: Abdomen is soft. There is no mass.     Tenderness: There is no abdominal tenderness. There is no right CVA tenderness, left CVA  tenderness, guarding or rebound.     Hernia: No hernia is present.  Genitourinary:    Comments: Genital exam deferred with shared decision making Musculoskeletal:        General: No swelling, tenderness, deformity or signs of injury.     Cervical back: Normal range of motion and neck supple. No rigidity. No muscular tenderness.     Right lower leg: No edema.     Left lower leg: No edema.  Lymphadenopathy:     Cervical: No cervical adenopathy.  Skin:    General: Skin is warm and dry.     Capillary Refill: Capillary refill takes less than 2 seconds.     Coloration: Skin is not jaundiced or pale.     Findings: No bruising, erythema, lesion or rash.  Neurological:     General: No focal deficit present.     Mental Status: He is alert and oriented to person, place, and time.     Cranial Nerves: No cranial nerve deficit.     Sensory: No sensory deficit.     Motor: No weakness.     Coordination: Coordination normal.     Gait: Gait normal.     Deep Tendon Reflexes: Reflexes normal.  Psychiatric:        Mood and Affect: Mood normal.        Behavior: Behavior normal.        Thought Content: Thought content normal.        Judgment: Judgment normal.     Results for orders placed or performed in visit on 01/01/24  Comprehensive metabolic panel   Collection Time: 01/01/24  9:52 AM  Result Value Ref Range   Glucose 114 (H) 70 - 99 mg/dL   BUN 15 6 - 24 mg/dL   Creatinine, Ser 9.05 0.76 - 1.27 mg/dL   eGFR 96 >40 fO/fpw/8.26   BUN/Creatinine Ratio 16 9 - 20   Sodium 139 134 -  144 mmol/L   Potassium 4.1 3.5 - 5.2 mmol/L   Chloride 102 96 - 106 mmol/L   CO2 23 20 - 29 mmol/L   Calcium  9.6 8.7 - 10.2 mg/dL   Total Protein 7.0 6.0 - 8.5 g/dL   Albumin 4.5 3.8 - 4.9 g/dL   Globulin, Total 2.5 1.5 - 4.5 g/dL   Bilirubin Total 0.4 0.0 - 1.2 mg/dL   Alkaline Phosphatase 65 44 - 121 IU/L   AST 20 0 - 40 IU/L   ALT 19 0 - 44 IU/L  CBC with Differential/Platelet   Collection Time: 01/01/24   9:52 AM  Result Value Ref Range   WBC 4.2 3.4 - 10.8 x10E3/uL   RBC 5.23 4.14 - 5.80 x10E6/uL   Hemoglobin 14.6 13.0 - 17.7 g/dL   Hematocrit 54.5 62.4 - 51.0 %   MCV 87 79 - 97 fL   MCH 27.9 26.6 - 33.0 pg   MCHC 32.2 31.5 - 35.7 g/dL   RDW 86.6 88.3 - 84.5 %   Platelets 207 150 - 450 x10E3/uL   Neutrophils 43 Not Estab. %   Lymphs 40 Not Estab. %   Monocytes 13 Not Estab. %   Eos 3 Not Estab. %   Basos 1 Not Estab. %   Neutrophils Absolute 1.8 1.4 - 7.0 x10E3/uL   Lymphocytes Absolute 1.7 0.7 - 3.1 x10E3/uL   Monocytes Absolute 0.6 0.1 - 0.9 x10E3/uL   EOS (ABSOLUTE) 0.1 0.0 - 0.4 x10E3/uL   Basophils Absolute 0.0 0.0 - 0.2 x10E3/uL   Immature Granulocytes 0 Not Estab. %   Immature Grans (Abs) 0.0 0.0 - 0.1 x10E3/uL  Lipid Panel w/o Chol/HDL Ratio   Collection Time: 01/01/24  9:52 AM  Result Value Ref Range   Cholesterol, Total 181 100 - 199 mg/dL   Triglycerides 825 (H) 0 - 149 mg/dL   HDL 39 (L) >60 mg/dL   VLDL Cholesterol Cal 31 5 - 40 mg/dL   LDL Chol Calc (NIH) 888 (H) 0 - 99 mg/dL  PSA   Collection Time: 01/01/24  9:52 AM  Result Value Ref Range   Prostate Specific Ag, Serum 0.5 0.0 - 4.0 ng/mL  TSH   Collection Time: 01/01/24  9:52 AM  Result Value Ref Range   TSH 0.655 0.450 - 4.500 uIU/mL  Microalbumin, Urine Waived   Collection Time: 01/01/24  9:52 AM  Result Value Ref Range   Microalb, Ur Waived 30 (H) 0 - 19 mg/L   Creatinine, Urine Waived 100 10 - 300 mg/dL   Microalb/Creat Ratio <30 <30 mg/g  Bayer DCA Hb A1c Waived   Collection Time: 01/01/24  9:52 AM  Result Value Ref Range   HB A1C (BAYER DCA - WAIVED) 6.5 (H) 4.8 - 5.6 %      Assessment & Plan:   Problem List Items Addressed This Visit       Endocrine   Diabetes mellitus with hyperglycemia (HCC)   Up to 7.3. Will really work on diet and exercise and recheck in 3-6 months- if going high will adjust medicine, but he's currently not consistently taking his medicine, so encouraged him to  take it consistently.      Relevant Medications   empagliflozin  (JARDIANCE ) 25 MG TABS tablet   lisinopril -hydrochlorothiazide  (ZESTORETIC ) 20-25 MG tablet   metFORMIN  (GLUCOPHAGE -XR) 500 MG 24 hr tablet   Other Relevant Orders   Comprehensive metabolic panel with GFR   CBC with Differential/Platelet   Microalbumin, Urine [employer]  DCA Hb A1c Waived     Genitourinary   Benign hypertensive renal disease   Running high. Will work on diet and exercise and recheck in 6 weeks- if still high may adjust medication. Call with any concerns.       Relevant Orders   Comprehensive metabolic panel with GFR   CBC with Differential/Platelet   Microalbumin, Urine Waived     Other   Hyperlipidemia   Under good control on current regimen. Continue current regimen. Continue to monitor. Call with any concerns. Refills given. Labs drawn today.        Relevant Medications   lisinopril -hydrochlorothiazide  (ZESTORETIC ) 20-25 MG tablet   Other Relevant Orders   Comprehensive metabolic panel with GFR   CBC with Differential/Platelet   Lipid Panel w/o Chol/HDL Ratio   Other Visit Diagnoses       Routine general medical examination at a health care facility    -  Primary   Vaccines up to date/declined. Screening labs checked today. Cologuard ordered. Continue diet and exercise. Call with any concerns.   Relevant Orders   Comprehensive metabolic panel with GFR   CBC with Differential/Platelet   Lipid Panel w/o Chol/HDL Ratio   PSA   TSH   Microalbumin, Urine Waived   Bayer DCA Hb A1c Waived   Hepatitis B surface antibody,quantitative     Screening for colon cancer       Cologuard ordered today.   Relevant Orders   Cologuard     Flu vaccine need       Flu shot given today.   Relevant Orders   Flu vaccine trivalent PF, 6mos and older(Flulaval,Afluria,Fluarix,Fluzone) (Completed)       LABORATORY TESTING:  Health maintenance labs ordered today as discussed above.   The natural  history of prostate cancer and ongoing controversy regarding screening and potential treatment outcomes of prostate cancer has been discussed with the patient. The meaning of a false positive PSA and a false negative PSA has been discussed. He indicates understanding of the limitations of this screening test and wishes to proceed with screening PSA testing.   IMMUNIZATIONS:   - Tdap: Tetanus vaccination status reviewed: last tetanus booster within 10 years. - Influenza: Administered today - Pneumovax: Up to date - Prevnar: out of stock - COVID: Refused - HPV: Not applicable - Shingrix vaccine: Refused  SCREENING: - Colonoscopy: Ordered today  Discussed with patient purpose of the colonoscopy is to detect colon cancer at curable precancerous or early stages   PATIENT COUNSELING:    Sexuality: Discussed sexually transmitted diseases, partner selection, use of condoms, avoidance of unintended pregnancy  and contraceptive alternatives.   Advised to avoid cigarette smoking.  I discussed with the patient that most people either abstain from alcohol or drink within safe limits (<=14/week and <=4 drinks/occasion for males, <=7/weeks and <= 3 drinks/occasion for females) and that the risk for alcohol disorders and other health effects rises proportionally with the number of drinks per week and how often a drinker exceeds daily limits.  Discussed cessation/primary prevention of drug use and availability of treatment for abuse.   Diet: Encouraged to adjust caloric intake to maintain  or achieve ideal body weight, to reduce intake of dietary saturated fat and total fat, to limit sodium intake by avoiding high sodium foods and not adding table salt, and to maintain adequate dietary potassium and calcium  preferably from fresh fruits, vegetables, and low-fat dairy products.    stressed the importance of regular exercise  Injury  prevention: Discussed safety belts, safety helmets, smoke detector,  smoking near bedding or upholstery.   Dental health: Discussed importance of regular tooth brushing, flossing, and dental visits.   Follow up plan: NEXT PREVENTATIVE PHYSICAL DUE IN 1 YEAR. Return in about 6 weeks (around 10/14/2024).

## 2024-09-02 NOTE — Assessment & Plan Note (Signed)
 Running high. Will work on diet and exercise and recheck in 6 weeks- if still high may adjust medication. Call with any concerns.

## 2024-09-03 LAB — COMPREHENSIVE METABOLIC PANEL WITH GFR
ALT: 18 IU/L (ref 0–44)
AST: 16 IU/L (ref 0–40)
Albumin: 4.5 g/dL (ref 3.8–4.9)
Alkaline Phosphatase: 66 IU/L (ref 47–123)
BUN/Creatinine Ratio: 14 (ref 9–20)
BUN: 13 mg/dL (ref 6–24)
Bilirubin Total: 0.3 mg/dL (ref 0.0–1.2)
CO2: 24 mmol/L (ref 20–29)
Calcium: 9.6 mg/dL (ref 8.7–10.2)
Chloride: 101 mmol/L (ref 96–106)
Creatinine, Ser: 0.91 mg/dL (ref 0.76–1.27)
Globulin, Total: 2.7 g/dL (ref 1.5–4.5)
Glucose: 125 mg/dL — ABNORMAL HIGH (ref 70–99)
Potassium: 4.2 mmol/L (ref 3.5–5.2)
Sodium: 139 mmol/L (ref 134–144)
Total Protein: 7.2 g/dL (ref 6.0–8.5)
eGFR: 100 mL/min/1.73 (ref >59)

## 2024-09-03 LAB — LIPID PANEL W/O CHOL/HDL RATIO
Cholesterol, Total: 228 mg/dL — ABNORMAL HIGH (ref 100–199)
HDL: 38 mg/dL — ABNORMAL LOW (ref >39)
LDL Chol Calc (NIH): 148 mg/dL — ABNORMAL HIGH (ref 0–99)
Triglycerides: 231 mg/dL — ABNORMAL HIGH (ref 0–149)
VLDL Cholesterol Cal: 42 mg/dL — ABNORMAL HIGH (ref 5–40)

## 2024-09-03 LAB — CBC WITH DIFFERENTIAL/PLATELET
Basophils Absolute: 0 x10E3/uL (ref 0.0–0.2)
Basos: 1 %
EOS (ABSOLUTE): 0.1 x10E3/uL (ref 0.0–0.4)
Eos: 3 %
Hematocrit: 46.8 % (ref 37.5–51.0)
Hemoglobin: 15.3 g/dL (ref 13.0–17.7)
Immature Grans (Abs): 0 x10E3/uL (ref 0.0–0.1)
Immature Granulocytes: 0 %
Lymphocytes Absolute: 1.9 x10E3/uL (ref 0.7–3.1)
Lymphs: 47 %
MCH: 28.6 pg (ref 26.6–33.0)
MCHC: 32.7 g/dL (ref 31.5–35.7)
MCV: 88 fL (ref 79–97)
Monocytes Absolute: 0.5 x10E3/uL (ref 0.1–0.9)
Monocytes: 13 %
Neutrophils Absolute: 1.5 x10E3/uL (ref 1.4–7.0)
Neutrophils: 36 %
Platelets: 197 x10E3/uL (ref 150–450)
RBC: 5.35 x10E6/uL (ref 4.14–5.80)
RDW: 13.3 % (ref 11.6–15.4)
WBC: 4 x10E3/uL (ref 3.4–10.8)

## 2024-09-03 LAB — PSA: Prostate Specific Ag, Serum: 0.5 ng/mL (ref 0.0–4.0)

## 2024-09-03 LAB — HEPATITIS B SURFACE ANTIBODY, QUANTITATIVE: Hepatitis B Surf Ab Quant: <3.5 m[IU]/mL — ABNORMAL LOW

## 2024-09-03 LAB — TSH: TSH: 0.68 u[IU]/mL (ref 0.450–4.500)

## 2024-09-08 ENCOUNTER — Ambulatory Visit: Payer: Self-pay | Admitting: Family Medicine

## 2024-09-28 ENCOUNTER — Other Ambulatory Visit: Payer: Self-pay | Admitting: Family Medicine

## 2024-09-29 NOTE — Telephone Encounter (Signed)
 Requested Prescriptions  Refused Prescriptions Disp Refills   metFORMIN  (GLUCOPHAGE -XR) 500 MG 24 hr tablet [Pharmacy Med Name: METFORMIN  ER 500MG  24HR TABS] 360 tablet 1    Sig: TAKE 2 TABLETS(1000 MG) BY MOUTH TWICE DAILY     Endocrinology:  Diabetes - Biguanides Failed - 09/29/2024  3:32 PM      Failed - B12 Level in normal range and within 720 days    No results found for: VITAMINB12       Passed - Cr in normal range and within 360 days    Creatinine, Ser  Date Value Ref Range Status  09/02/2024 0.91 0.76 - 1.27 mg/dL Final         Passed - HBA1C is between 0 and 7.9 and within 180 days    HB A1C (BAYER DCA - WAIVED)  Date Value Ref Range Status  09/02/2024 7.3 (H) 4.8 - 5.6 % Final    Comment:             Prediabetes: 5.7 - 6.4          Diabetes: >6.4          Glycemic control for adults with diabetes: <7.0          Passed - eGFR in normal range and within 360 days    GFR calc Af Amer  Date Value Ref Range Status  06/29/2020 116 >59 mL/min/1.73 Final    Comment:    **[facility] currently reports eGFR in compliance with the current**   recommendations of the [employer]. [facility] will   update reporting as new guidelines are published from the NKF-ASN   Task force.    GFR calc non Af Amer  Date Value Ref Range Status  06/29/2020 100 >59 mL/min/1.73 Final   eGFR  Date Value Ref Range Status  09/02/2024 100 >59 mL/min/1.73 Final         Passed - Valid encounter within last 6 months    Recent Outpatient Visits           3 weeks ago Routine general medical examination at a health care facility   [facility], [place] P, DO   9 months ago Type 2 diabetes mellitus with hyperglycemia, without long-term current use of insulin (HCC)   [place] [facility] [place], [place] P, DO              Passed - CBC within normal limits and completed in the last 12 months    WBC  Date Value Ref Range Status   09/02/2024 4.0 3.4 - 10.8 x10E3/uL Final   RBC  Date Value Ref Range Status  09/02/2024 5.35 4.14 - 5.80 x10E6/uL Final   Hemoglobin  Date Value Ref Range Status  09/02/2024 15.3 13.0 - 17.7 g/dL Final   Hematocrit  Date Value Ref Range Status  09/02/2024 46.8 37.5 - 51.0 % Final   MCHC  Date Value Ref Range Status  09/02/2024 32.7 31.5 - 35.7 g/dL Final   [facility]  Date Value Ref Range Status  09/02/2024 28.6 26.6 - 33.0 pg Final   MCV  Date Value Ref Range Status  09/02/2024 88 79 - 97 fL Final   No results found for: PLTCOUNTKUC, LABPLAT, POCPLA RDW  Date Value Ref Range Status  09/02/2024 13.3 11.6 - 15.4 % Final

## 2024-09-30 ENCOUNTER — Other Ambulatory Visit: Payer: Self-pay | Admitting: Family Medicine

## 2024-10-01 NOTE — Telephone Encounter (Signed)
 Already refilled on 09/02/24 #90/1 refill, will refuse this request.  Requested Prescriptions  Pending Prescriptions Disp Refills   JARDIANCE  25 MG TABS tablet [Pharmacy Med Name: JARDIANCE  25MG  TABLETS] 30 tablet     Sig: TAKE 1 TABLET(25 MG) BY MOUTH DAILY     Endocrinology:  Diabetes - SGLT2 Inhibitors Passed - 10/01/2024  9:08 AM      Passed - Cr in normal range and within 360 days    Creatinine, Ser  Date Value Ref Range Status  09/02/2024 0.91 0.76 - 1.27 mg/dL Final         Passed - HBA1C is between 0 and 7.9 and within 180 days    HB A1C (BAYER DCA - WAIVED)  Date Value Ref Range Status  09/02/2024 7.3 (H) 4.8 - 5.6 % Final    Comment:             Prediabetes: 5.7 - 6.4          Diabetes: >6.4          Glycemic control for adults with diabetes: <7.0          Passed - eGFR in normal range and within 360 days    GFR calc Af Amer  Date Value Ref Range Status  06/29/2020 116 >59 mL/min/1.73 Final    Comment:    **[facility] currently reports eGFR in compliance with the current**   recommendations of the [employer]. [facility] will   update reporting as new guidelines are published from the NKF-ASN   Task force.    GFR calc non Af Amer  Date Value Ref Range Status  06/29/2020 100 >59 mL/min/1.73 Final   eGFR  Date Value Ref Range Status  09/02/2024 100 >59 mL/min/1.73 Final         Passed - Valid encounter within last 6 months    Recent Outpatient Visits           4 weeks ago Routine general medical examination at a health care facility   [facility], [place] P, DO   9 months ago Type 2 diabetes mellitus with hyperglycemia, without long-term current use of insulin [facility])   [place] [facility] [place], [place], DO

## 2024-10-07 ENCOUNTER — Telehealth: Payer: Self-pay

## 2024-10-07 ENCOUNTER — Other Ambulatory Visit: Payer: Self-pay | Admitting: Family Medicine

## 2024-10-07 NOTE — Telephone Encounter (Signed)
 Copied from CRM #[id number]. Topic: Clinical - Prescription Issue >> Oct 07, 2024 11:39 AM Travis F wrote: Reason for CRM: Patient is calling in to check on the status of his refill request for empagliflozin  (JARDIANCE ) 25 MG TABS tablet [[id number]]. Patient was told a 90 day supply was sent in. Checked the notes and saw that a 90 day supply was sent in 09/02/24. Patient says he didn't get 90 tablets. Patient says he got 30 tablets on 09/02/24. Patient says his insurance will not cover 90 tablets, and he never received. Please follow up with patient.

## 2024-10-08 ENCOUNTER — Other Ambulatory Visit: Payer: Self-pay | Admitting: Family Medicine

## 2024-10-10 ENCOUNTER — Other Ambulatory Visit: Payer: Self-pay | Admitting: Family Medicine

## 2024-10-10 NOTE — Telephone Encounter (Signed)
 Duplicate request, LRF 09/02/24 for 90 days.  Requested Prescriptions  Pending Prescriptions Disp Refills   JARDIANCE  25 MG TABS tablet [Pharmacy Med Name: JARDIANCE  25MG  TABLETS] 15 tablet     Sig: TAKE 1 TABLET(25 MG) BY MOUTH DAILY     Endocrinology:  Diabetes - SGLT2 Inhibitors Passed - 10/10/2024  9:56 AM      Passed - Cr in normal range and within 360 days    Creatinine, Ser  Date Value Ref Range Status  09/02/2024 0.91 0.76 - 1.27 mg/dL Final         Passed - HBA1C is between 0 and 7.9 and within 180 days    HB A1C (BAYER DCA - WAIVED)  Date Value Ref Range Status  09/02/2024 7.3 (H) 4.8 - 5.6 % Final    Comment:             Prediabetes: 5.7 - 6.4          Diabetes: >6.4          Glycemic control for adults with diabetes: <7.0          Passed - eGFR in normal range and within 360 days    GFR calc Af Amer  Date Value Ref Range Status  06/29/2020 116 >59 mL/min/1.73 Final    Comment:    **[facility] currently reports eGFR in compliance with the current**   recommendations of the [employer]. [facility] will   update reporting as new guidelines are published from the NKF-ASN   Task force.    GFR calc non Af Amer  Date Value Ref Range Status  06/29/2020 100 >59 mL/min/1.73 Final   eGFR  Date Value Ref Range Status  09/02/2024 100 >59 mL/min/1.73 Final         Passed - Valid encounter within last 6 months    Recent Outpatient Visits           1 month ago Routine general medical examination at a health care facility   [facility], [place] P, DO   9 months ago Type 2 diabetes mellitus with hyperglycemia, without long-term current use of insulin [facility])   [place] [facility] [place], [place], DO

## 2024-10-11 ENCOUNTER — Other Ambulatory Visit: Payer: Self-pay | Admitting: Family Medicine

## 2024-10-11 NOTE — Telephone Encounter (Signed)
 Requested Prescriptions  Pending Prescriptions Disp Refills   empagliflozin  (JARDIANCE ) 25 MG TABS tablet [Pharmacy Med Name: JARDIANCE  25MG  TABLETS] 90 tablet 0    Sig: TAKE 1 TABLET(25 MG) BY MOUTH DAILY     Endocrinology:  Diabetes - SGLT2 Inhibitors Passed - 10/11/2024  9:18 AM      Passed - Cr in normal range and within 360 days    Creatinine, Ser  Date Value Ref Range Status  09/02/2024 0.91 0.76 - 1.27 mg/dL Final         Passed - HBA1C is between 0 and 7.9 and within 180 days    HB A1C (BAYER DCA - WAIVED)  Date Value Ref Range Status  09/02/2024 7.3 (H) 4.8 - 5.6 % Final    Comment:             Prediabetes: 5.7 - 6.4          Diabetes: >6.4          Glycemic control for adults with diabetes: <7.0          Passed - eGFR in normal range and within 360 days    GFR calc Af Amer  Date Value Ref Range Status  06/29/2020 116 >59 mL/min/1.73 Final    Comment:    **[facility] currently reports eGFR in compliance with the current**   recommendations of the [employer]. [facility] will   update reporting as new guidelines are published from the NKF-ASN   Task force.    GFR calc non Af Amer  Date Value Ref Range Status  06/29/2020 100 >59 mL/min/1.73 Final   eGFR  Date Value Ref Range Status  09/02/2024 100 >59 mL/min/1.73 Final         Passed - Valid encounter within last 6 months    Recent Outpatient Visits           1 month ago Routine general medical examination at a health care facility   [facility], [place] P, DO   9 months ago Type 2 diabetes mellitus with hyperglycemia, without long-term current use of insulin [facility])   [place] [facility] [place], [place], DO

## 2024-10-11 NOTE — Telephone Encounter (Signed)
 Duplicate request.  Requested Prescriptions  Pending Prescriptions Disp Refills   JARDIANCE  25 MG TABS tablet [Pharmacy Med Name: JARDIANCE  25MG  TABLETS] 15 tablet     Sig: TAKE 1 TABLET(25 MG) BY MOUTH DAILY     Endocrinology:  Diabetes - SGLT2 Inhibitors Passed - 10/11/2024  4:29 PM      Passed - Cr in normal range and within 360 days    Creatinine, Ser  Date Value Ref Range Status  09/02/2024 0.91 0.76 - 1.27 mg/dL Final         Passed - HBA1C is between 0 and 7.9 and within 180 days    HB A1C (BAYER DCA - WAIVED)  Date Value Ref Range Status  09/02/2024 7.3 (H) 4.8 - 5.6 % Final    Comment:             Prediabetes: 5.7 - 6.4          Diabetes: >6.4          Glycemic control for adults with diabetes: <7.0          Passed - eGFR in normal range and within 360 days    GFR calc Af Amer  Date Value Ref Range Status  06/29/2020 116 >59 mL/min/1.73 Final    Comment:    **[facility] currently reports eGFR in compliance with the current**   recommendations of the [employer]. [facility] will   update reporting as new guidelines are published from the NKF-ASN   Task force.    GFR calc non Af Amer  Date Value Ref Range Status  06/29/2020 100 >59 mL/min/1.73 Final   eGFR  Date Value Ref Range Status  09/02/2024 100 >59 mL/min/1.73 Final         Passed - Valid encounter within last 6 months    Recent Outpatient Visits           1 month ago Routine general medical examination at a health care facility   [facility], [place] P, DO   9 months ago Type 2 diabetes mellitus with hyperglycemia, without long-term current use of insulin [facility])   [place] [facility] [place], [place], DO

## 2024-10-13 NOTE — Telephone Encounter (Signed)
 Already refilled on 10/11/24 in a separate refill encounter.Will refuse this request due to this.  Requested Prescriptions  Pending Prescriptions Disp Refills   JARDIANCE  25 MG TABS tablet [Pharmacy Med Name: JARDIANCE  25MG  TABLETS] 15 tablet     Sig: TAKE 1 TABLET(25 MG) BY MOUTH DAILY     Endocrinology:  Diabetes - SGLT2 Inhibitors Passed - 10/13/2024  2:04 PM      Passed - Cr in normal range and within 360 days    Creatinine, Ser  Date Value Ref Range Status  09/02/2024 0.91 0.76 - 1.27 mg/dL Final         Passed - HBA1C is between 0 and 7.9 and within 180 days    HB A1C (BAYER DCA - WAIVED)  Date Value Ref Range Status  09/02/2024 7.3 (H) 4.8 - 5.6 % Final    Comment:             Prediabetes: 5.7 - 6.4          Diabetes: >6.4          Glycemic control for adults with diabetes: <7.0          Passed - eGFR in normal range and within 360 days    GFR calc Af Amer  Date Value Ref Range Status  06/29/2020 116 >59 mL/min/1.73 Final    Comment:    **[facility] currently reports eGFR in compliance with the current**   recommendations of the [employer]. [facility] will   update reporting as new guidelines are published from the NKF-ASN   Task force.    GFR calc non Af Amer  Date Value Ref Range Status  06/29/2020 100 >59 mL/min/1.73 Final   eGFR  Date Value Ref Range Status  09/02/2024 100 >59 mL/min/1.73 Final         Passed - Valid encounter within last 6 months    Recent Outpatient Visits           1 month ago Routine general medical examination at a health care facility   [facility], [place] P, DO   9 months ago Type 2 diabetes mellitus with hyperglycemia, without long-term current use of insulin [facility])   [place] [facility] [place], [place], DO

## 2024-10-25 ENCOUNTER — Other Ambulatory Visit: Payer: Self-pay | Admitting: Family Medicine

## 2024-10-27 ENCOUNTER — Ambulatory Visit: Admitting: Family Medicine

## 2024-10-27 NOTE — Telephone Encounter (Signed)
 Too soon for refill.  Requested Prescriptions  Pending Prescriptions Disp Refills   lisinopril -hydrochlorothiazide  (ZESTORETIC ) 20-25 MG tablet [Pharmacy Med Name: LISINOPRIL -HCTZ 20/25MG  TABLETS] 90 tablet 1    Sig: TAKE 1 TABLET BY MOUTH DAILY     Cardiovascular:  ACEI + Diuretic Combos Failed - 10/27/2024  4:07 PM      Failed - Last BP in normal range    BP Readings from Last 1 Encounters:  09/02/24 (!) 148/90         Passed - Na in normal range and within 180 days    Sodium  Date Value Ref Range Status  09/02/2024 139 134 - 144 mmol/L Final         Passed - K in normal range and within 180 days    Potassium  Date Value Ref Range Status  09/02/2024 4.2 3.5 - 5.2 mmol/L Final         Passed - Cr in normal range and within 180 days    Creatinine, Ser  Date Value Ref Range Status  09/02/2024 0.91 0.76 - 1.27 mg/dL Final         Passed - eGFR is 30 or above and within 180 days    GFR calc Af Amer  Date Value Ref Range Status  06/29/2020 116 >59 mL/min/1.73 Final    Comment:    **[facility] currently reports eGFR in compliance with the current**   recommendations of the [employer]. [facility] will   update reporting as new guidelines are published from the NKF-ASN   Task force.    GFR calc non Af Amer  Date Value Ref Range Status  06/29/2020 100 >59 mL/min/1.73 Final   eGFR  Date Value Ref Range Status  09/02/2024 100 >59 mL/min/1.73 Final         Passed - Patient is not pregnant      Passed - Valid encounter within last 6 months    Recent Outpatient Visits           1 month ago Routine general medical examination at a health care facility   [facility], [place] P, DO   10 months ago Type 2 diabetes mellitus with hyperglycemia, without long-term current use of insulin [facility])   [place] [facility] [place], [place], DO

## 2024-11-25 ENCOUNTER — Encounter: Payer: Self-pay | Admitting: Family Medicine

## 2024-11-25 ENCOUNTER — Telehealth: Payer: Self-pay

## 2024-11-25 ENCOUNTER — Telehealth: Admitting: Family Medicine

## 2024-11-25 VITALS — Temp 99.9°F | Ht 72.0 in | Wt 220.0 lb

## 2024-11-25 DIAGNOSIS — Z20828 Contact with and (suspected) exposure to other viral communicable diseases: Secondary | ICD-10-CM

## 2024-11-25 MED ORDER — OSELTAMIVIR PHOSPHATE 75 MG PO CAPS: 75.0000 mg | ORAL_CAPSULE | Freq: Every day | ORAL | 0 refills | Status: AC

## 2024-11-25 NOTE — Telephone Encounter (Signed)
 Needs appt, virtual OK. OK to double book this PM

## 2024-11-25 NOTE — Progress Notes (Signed)
 "  Temp 99.9 F (37.7 C) (Temporal)   Ht 6' (1.829 m)   Wt 220 lb (99.8 kg)   BMI 29.84 kg/m    Subjective:    Patient ID: Greg Wood, male    DOB: [birth year], 54 y.o.   MRN: [id number]  HPI: Greg Wood is a 54 y.o. male  Chief Complaint  Patient presents with   Cough    Onset yesterday. Wife tested positive for flu A today.    Nasal Congestion   Headache   UPPER RESPIRATORY TRACT INFECTION Duration: maybe today Worst symptom: slight headache and cough Fever: yes Cough: yes Shortness of breath: no Wheezing: yes Chest pain: no Chest tightness: no Chest congestion: no Nasal congestion: yes Runny nose: yes Post nasal drip: no Sneezing: no Sore throat: yes Swollen glands: no Sinus pressure: no Headache: yes Face pain: no Toothache: no Ear pain: no  Ear pressure: no  Eyes red/itching:no Eye drainage/crusting: no  Vomiting: no Rash: no Fatigue: yes Sick contacts: yes Strep contacts: no  Context: stable Recurrent sinusitis: no Relief with OTC cold/cough medications: no  Treatments attempted: none   Relevant past medical, surgical, family and social history reviewed and updated as indicated. Interim medical history since our last visit reviewed. Allergies and medications reviewed and updated.  Review of Systems  Constitutional:  Positive for fever. Negative for activity change, appetite change, chills, diaphoresis, fatigue and unexpected weight change.  HENT:  Positive for congestion and sore throat. Negative for dental problem, drooling, ear discharge, ear pain, facial swelling, hearing loss, mouth sores, nosebleeds, postnasal drip, rhinorrhea, sinus pressure, sinus pain, sneezing, tinnitus, trouble swallowing and voice change.   Respiratory:  Positive for wheezing. Negative for apnea, cough, choking, chest tightness, shortness of breath and stridor.   Cardiovascular: Negative.   Musculoskeletal: Negative.   Psychiatric/Behavioral: Negative.      Per  HPI unless specifically indicated above     Objective:    Temp 99.9 F (37.7 C) (Temporal)   Ht 6' (1.829 m)   Wt 220 lb (99.8 kg)   BMI 29.84 kg/m   Wt Readings from Last 3 Encounters:  11/25/24 220 lb (99.8 kg)  09/02/24 222 lb (100.7 kg)  01/18/24 222 lb 10.6 oz (101 kg)    Physical Exam Constitutional:      General: He is not in acute distress.    Appearance: Normal appearance. He is well-developed. He is obese. He is not ill-appearing, toxic-appearing or diaphoretic.  HENT:     Head: Normocephalic and atraumatic.     Right Ear: Hearing and external ear normal.     Left Ear: Hearing and external ear normal.     Nose: Nose normal.  Eyes:     General: Lids are normal. No scleral icterus.       Right eye: No discharge.        Left eye: No discharge.     Extraocular Movements: Extraocular movements intact.     Conjunctiva/sclera: Conjunctivae normal.     Pupils: Pupils are equal, round, and reactive to light.  Pulmonary:     Effort: Pulmonary effort is normal. No respiratory distress.  Musculoskeletal:        General: Normal range of motion.     Cervical back: Normal range of motion.  Skin:    Coloration: Skin is not jaundiced or pale.     Findings: No bruising, erythema, lesion or rash.  Neurological:     General: No focal deficit present.  Mental Status: He is alert and oriented to person, place, and time.  Psychiatric:        Mood and Affect: Mood normal.        Speech: Speech normal.        Behavior: Behavior normal.        Thought Content: Thought content normal.        Judgment: Judgment normal.     Results for orders placed or performed in visit on 09/02/24  Microalbumin, Urine Waived   Collection Time: 09/02/24  9:57 AM  Result Value Ref Range   Microalb, Ur Waived 30 (H) 0 - 19 mg/L   Creatinine, Urine Waived 100 10 - 300 mg/dL   Microalb/Creat Ratio <30 <30 mg/g  Bayer DCA Hb A1c Waived   Collection Time: 09/02/24  9:57 AM  Result Value Ref  Range   HB A1C (BAYER DCA - WAIVED) 7.3 (H) 4.8 - 5.6 %  Comprehensive metabolic panel with GFR   Collection Time: 09/02/24  9:58 AM  Result Value Ref Range   Glucose 125 (H) 70 - 99 mg/dL   BUN 13 6 - 24 mg/dL   Creatinine, Ser 9.08 0.76 - 1.27 mg/dL   eGFR 899 >40 fO/fpw/8.26   BUN/Creatinine Ratio 14 9 - 20   Sodium 139 134 - 144 mmol/L   Potassium 4.2 3.5 - 5.2 mmol/L   Chloride 101 96 - 106 mmol/L   CO2 24 20 - 29 mmol/L   Calcium  9.6 8.7 - 10.2 mg/dL   Total Protein 7.2 6.0 - 8.5 g/dL   Albumin 4.5 3.8 - 4.9 g/dL   Globulin, Total 2.7 1.5 - 4.5 g/dL   Bilirubin Total 0.3 0.0 - 1.2 mg/dL   Alkaline Phosphatase 66 47 - 123 IU/L   AST 16 0 - 40 IU/L   ALT 18 0 - 44 IU/L  CBC with Differential/Platelet   Collection Time: 09/02/24  9:58 AM  Result Value Ref Range   WBC 4.0 3.4 - 10.8 x10E3/uL   RBC 5.35 4.14 - 5.80 x10E6/uL   Hemoglobin 15.3 13.0 - 17.7 g/dL   Hematocrit 53.1 62.4 - 51.0 %   MCV 88 79 - 97 fL   MCH 28.6 26.6 - 33.0 pg   MCHC 32.7 31.5 - 35.7 g/dL   RDW 86.6 88.3 - 84.5 %   Platelets 197 150 - 450 x10E3/uL   Neutrophils 36 Not Estab. %   Lymphs 47 Not Estab. %   Monocytes 13 Not Estab. %   Eos 3 Not Estab. %   Basos 1 Not Estab. %   Neutrophils Absolute 1.5 1.4 - 7.0 x10E3/uL   Lymphocytes Absolute 1.9 0.7 - 3.1 x10E3/uL   Monocytes Absolute 0.5 0.1 - 0.9 x10E3/uL   EOS (ABSOLUTE) 0.1 0.0 - 0.4 x10E3/uL   Basophils Absolute 0.0 0.0 - 0.2 x10E3/uL   Immature Granulocytes 0 Not Estab. %   Immature Grans (Abs) 0.0 0.0 - 0.1 x10E3/uL  Lipid Panel w/o Chol/HDL Ratio   Collection Time: 09/02/24  9:58 AM  Result Value Ref Range   Cholesterol, Total 228 (H) 100 - 199 mg/dL   Triglycerides 768 (H) 0 - 149 mg/dL   HDL 38 (L) >60 mg/dL   VLDL Cholesterol Cal 42 (H) 5 - 40 mg/dL   LDL Chol Calc (NIH) 851 (H) 0 - 99 mg/dL  PSA   Collection Time: 09/02/24  9:58 AM  Result Value Ref Range   Prostate Specific Ag, Serum 0.5 0.0 - 4.0  ng/mL  TSH   Collection  Time: 09/02/24  9:58 AM  Result Value Ref Range   TSH 0.680 0.450 - 4.500 uIU/mL  Hepatitis B surface antibody,quantitative   Collection Time: 09/02/24  9:58 AM  Result Value Ref Range   Hepatitis B Surf Ab Quant <3.5 (L) Immunity>10 mIU/mL      Assessment & Plan:   Problem List Items Addressed This Visit   None Visit Diagnoses       Exposure to the flu    -  Primary   Unclear if he has the flu already. Rx for tamiflu  sent to his pharmacy- advised him to pick up at home flu test. Call with any concerns or if not getting better        Follow up plan: Return if symptoms worsen or fail to improve.   This visit was completed via video visit through MyChart due to the restrictions of the COVID-19 pandemic. All issues as above were discussed and addressed. Physical exam was done as above through visual confirmation on video through MyChart. If it was felt that the patient should be evaluated in the office, they were directed there. The patient verbally consented to this visit. Location of the patient: home Location of the provider: work Those involved with this call:  Provider: Duwaine Louder, DO CMA: Izetta Sarah, CMA Front Desk/Registration: Katina Slade  Time spent on call: 15 minutes with patient face to face via video conference. More than 50% of this time was spent in counseling and coordination of care. 23 minutes total spent in review of patient's record and preparation of their chart.     "

## 2024-11-25 NOTE — Telephone Encounter (Signed)
 Copied from CRM #[id number]. Topic: Clinical - Medical Advice >> Nov 25, 2024  2:02 PM Sophia H wrote: Reason for CRM: Patients wife has the flu A and patient is experiencing the same symptoms - wanting to know if medication can be sent in.  Decl scheduling in.   [id number] - [place], [place] - [address]

## 2024-11-25 NOTE — Telephone Encounter (Signed)
 Scheduled

## 2024-12-01 LAB — COLOGUARD

## 2024-12-29 ENCOUNTER — Other Ambulatory Visit: Payer: Self-pay | Admitting: Family Medicine

## 2024-12-30 NOTE — Telephone Encounter (Signed)
 Requested Prescriptions  Pending Prescriptions Disp Refills   JARDIANCE  25 MG TABS tablet [Pharmacy Med Name: JARDIANCE  25MG  TABLETS] 90 tablet 0    Sig: TAKE 1 TABLET(25 MG) BY MOUTH DAILY     Endocrinology:  Diabetes - SGLT2 Inhibitors Passed - 12/30/2024  2:02 PM      Passed - Cr in normal range and within 360 days    Creatinine, Ser  Date Value Ref Range Status  09/02/2024 0.91 0.76 - 1.27 mg/dL Final         Passed - HBA1C is between 0 and 7.9 and within 180 days    HB A1C (BAYER DCA - WAIVED)  Date Value Ref Range Status  09/02/2024 7.3 (H) 4.8 - 5.6 % Final    Comment:             Prediabetes: 5.7 - 6.4          Diabetes: >6.4          Glycemic control for adults with diabetes: <7.0          Passed - eGFR in normal range and within 360 days    GFR calc Af Amer  Date Value Ref Range Status  06/29/2020 116 >59 mL/min/1.73 Final    Comment:    **[facility] currently reports eGFR in compliance with the current**   recommendations of the [employer]. [facility] will   update reporting as new guidelines are published from the NKF-ASN   Task force.    GFR calc non Af Amer  Date Value Ref Range Status  06/29/2020 100 >59 mL/min/1.73 Final   eGFR  Date Value Ref Range Status  09/02/2024 100 >59 mL/min/1.73 Final         Passed - Valid encounter within last 6 months    Recent Outpatient Visits           1 month ago Exposure to the flu   [facility] [facility] [place], [place], DO   3 months ago Routine general medical examination at a health care facility   [facility], [place] P, DO   12 months ago Type 2 diabetes mellitus with hyperglycemia, without long-term current use of insulin [facility])   [place] [facility] [place], [place], DO
